# Patient Record
Sex: Male | Born: 1975 | Hispanic: Yes | Marital: Married | Smoking: Never smoker
Health system: Southern US, Community
[De-identification: ages and names within clinical notes are randomized; demographics above are authoritative.]

## PROBLEM LIST (undated history)

## (undated) DIAGNOSIS — F32A Depression, unspecified: Secondary | ICD-10-CM

## (undated) DIAGNOSIS — G510 Bell's palsy: Secondary | ICD-10-CM

## (undated) DIAGNOSIS — F419 Anxiety disorder, unspecified: Secondary | ICD-10-CM

## (undated) DIAGNOSIS — E119 Type 2 diabetes mellitus without complications: Secondary | ICD-10-CM

## (undated) DIAGNOSIS — I639 Cerebral infarction, unspecified: Secondary | ICD-10-CM

## (undated) HISTORY — DX: Cerebral infarction, unspecified: I63.9

## (undated) HISTORY — DX: Type 2 diabetes mellitus without complications: E11.9

## (undated) HISTORY — PX: APPENDECTOMY: SHX54

---

## 2017-05-07 ENCOUNTER — Other Ambulatory Visit: Payer: Self-pay

## 2017-05-07 ENCOUNTER — Emergency Department (HOSPITAL_BASED_OUTPATIENT_CLINIC_OR_DEPARTMENT_OTHER): Payer: Self-pay

## 2017-05-07 ENCOUNTER — Encounter (HOSPITAL_BASED_OUTPATIENT_CLINIC_OR_DEPARTMENT_OTHER): Payer: Self-pay | Admitting: Emergency Medicine

## 2017-05-07 ENCOUNTER — Observation Stay (HOSPITAL_BASED_OUTPATIENT_CLINIC_OR_DEPARTMENT_OTHER)
Admission: EM | Admit: 2017-05-07 | Discharge: 2017-05-09 | Disposition: A | Discharge status: Home / Self Care | Payer: Self-pay | Attending: Internal Medicine | Admitting: Internal Medicine

## 2017-05-07 DIAGNOSIS — Z79899 Other long term (current) drug therapy: Secondary | ICD-10-CM | POA: Insufficient documentation

## 2017-05-07 DIAGNOSIS — R51 Headache: Secondary | ICD-10-CM | POA: Insufficient documentation

## 2017-05-07 DIAGNOSIS — E119 Type 2 diabetes mellitus without complications: Secondary | ICD-10-CM | POA: Insufficient documentation

## 2017-05-07 DIAGNOSIS — R531 Weakness: Secondary | ICD-10-CM | POA: Insufficient documentation

## 2017-05-07 DIAGNOSIS — G518 Other disorders of facial nerve: Principal | ICD-10-CM | POA: Insufficient documentation

## 2017-05-07 DIAGNOSIS — F17200 Nicotine dependence, unspecified, uncomplicated: Secondary | ICD-10-CM | POA: Insufficient documentation

## 2017-05-07 DIAGNOSIS — M6281 Muscle weakness (generalized): Secondary | ICD-10-CM | POA: Diagnosis present

## 2017-05-07 DIAGNOSIS — E785 Hyperlipidemia, unspecified: Secondary | ICD-10-CM | POA: Insufficient documentation

## 2017-05-07 DIAGNOSIS — Z789 Other specified health status: Secondary | ICD-10-CM

## 2017-05-07 DIAGNOSIS — R200 Anesthesia of skin: Secondary | ICD-10-CM | POA: Insufficient documentation

## 2017-05-07 DIAGNOSIS — H5589 Other irregular eye movements: Secondary | ICD-10-CM | POA: Insufficient documentation

## 2017-05-07 DIAGNOSIS — R519 Headache, unspecified: Secondary | ICD-10-CM | POA: Diagnosis present

## 2017-05-07 DIAGNOSIS — I10 Essential (primary) hypertension: Secondary | ICD-10-CM | POA: Insufficient documentation

## 2017-05-07 DIAGNOSIS — R2990 Unspecified symptoms and signs involving the nervous system: Secondary | ICD-10-CM

## 2017-05-07 DIAGNOSIS — R4789 Other speech disturbances: Secondary | ICD-10-CM | POA: Insufficient documentation

## 2017-05-07 LAB — RAPID URINE DRUG SCREEN, HOSP PERFORMED
Amphetamines: NOT DETECTED
Barbiturates: NOT DETECTED
Benzodiazepines: NOT DETECTED
Cocaine: NOT DETECTED
Opiates: NOT DETECTED
Tetrahydrocannabinol: NOT DETECTED

## 2017-05-07 LAB — CBC WITH DIFFERENTIAL/PLATELET
Basophils Absolute: 0 10*3/uL (ref 0.0–0.1)
Basophils Relative: 0 %
Eosinophils Absolute: 0.1 10*3/uL (ref 0.0–0.7)
Eosinophils Relative: 2 %
HCT: 43.1 % (ref 39.0–52.0)
Hemoglobin: 15.4 g/dL (ref 13.0–17.0)
Lymphocytes Relative: 39 %
Lymphs Abs: 2.7 10*3/uL (ref 0.7–4.0)
MCH: 31.8 pg (ref 26.0–34.0)
MCHC: 35.7 g/dL (ref 30.0–36.0)
MCV: 88.9 fL (ref 78.0–100.0)
Monocytes Absolute: 0.4 10*3/uL (ref 0.1–1.0)
Monocytes Relative: 5 %
Neutro Abs: 3.7 10*3/uL (ref 1.7–7.7)
Neutrophils Relative %: 54 %
Platelets: 271 10*3/uL (ref 150–400)
RBC: 4.85 MIL/uL (ref 4.22–5.81)
RDW: 13 % (ref 11.5–15.5)
WBC: 6.8 10*3/uL (ref 4.0–10.5)

## 2017-05-07 LAB — BASIC METABOLIC PANEL
Anion gap: 10 (ref 5–15)
BUN: 15 mg/dL (ref 6–20)
CO2: 24 mmol/L (ref 22–32)
Calcium: 9.3 mg/dL (ref 8.9–10.3)
Chloride: 102 mmol/L (ref 101–111)
Creatinine, Ser: 0.67 mg/dL (ref 0.61–1.24)
GFR calc Af Amer: >60 mL/min (ref >60)
GFR calc non Af Amer: >60 mL/min (ref >60)
Glucose, Bld: 216 mg/dL — ABNORMAL HIGH (ref 65–99)
Potassium: 3.5 mmol/L (ref 3.5–5.1)
Sodium: 136 mmol/L (ref 135–145)

## 2017-05-07 LAB — PROTIME-INR
INR: 0.95
Prothrombin Time: 12.6 seconds (ref 11.4–15.2)

## 2017-05-07 LAB — APTT: aPTT: 27 seconds (ref 24–36)

## 2017-05-07 LAB — ETHANOL: Alcohol, Ethyl (B): <10 mg/dL (ref <10)

## 2017-05-07 MED ORDER — ASPIRIN 81 MG PO CHEW
324.0000 mg | CHEWABLE_TABLET | Freq: Once | ORAL | Status: AC
  Administered 2017-05-07: 324 mg via ORAL
  Filled 2017-05-07: qty 4

## 2017-05-07 NOTE — Plan of Care (Signed)
41 y/o male with no known medical history who presents with new right sided weakness. Last known normal pe family was 10 pm last night. Woke up this am with right sided weakness, perioral numbness and decrease in right eye movement. He is stil alert and oriented x 2. Initial CT head at [facility] was negative for stroke. Transfer to [facility] for further evaluation/workup.  [place] hospitalists

## 2017-05-07 NOTE — ED Notes (Signed)
Pt states some fullness feeling to face,  But pt and family in eating fast foods,   Pt told he should be npo at present

## 2017-05-07 NOTE — ED Triage Notes (Signed)
Pt sts he woke with RT side facial, arm and leg weakness; went to [facility] and they referred him here

## 2017-05-07 NOTE — ED Provider Notes (Signed)
[facility] Provider Note   CSN: [id number] Arrival date & time: 05/07/17  1049     History   Chief Complaint Chief Complaint  Patient presents with  . Facial Droop    HPI Drew [name] is a 41 y.o. male.  HPI   41 year old male with facial numbness and right-sided weakness.  First noticed symptoms around 8 AM this morning.  Last known normal around 10 PM yesterday when he went to bed.  When he woke up he noticed perioral numbness.  He felt like he was having difficulty controlling his right eye and having numbness and weakness in right arm and leg.  Mild posterior headache. Symptoms have been fairly constant since he first noticed them.  Denies any changes in visual acuity or diplopia. Patient is primarily Spanish-speaking.  Interpreter was used for history taking.  Family member reports that his speech seems perhaps a little "slow" but easily understandable. No confusion.   History reviewed. No pertinent past medical history.  There are no active problems to display for this patient.   Past Surgical History:  Procedure Laterality Date  . APPENDECTOMY         Home Medications    Prior to Admission medications   Not on File    Family History No family history on file.  Social History Social History   Tobacco Use  . Smoking status: Current Some Day Smoker  . Smokeless tobacco: Never Used  Substance Use Topics  . Alcohol use: Yes    Comment: occ  . Drug use: No     Allergies   Patient has no known allergies.   Review of Systems Review of Systems  All systems reviewed and negative, other than as noted in HPI.  Physical Exam Updated Vital Signs BP (!) 139/102   Pulse 80   Temp 98.7 F (37.1 C) (Oral)   Resp 18   Ht 5\' 9"  (1.753 m)   Wt 90.7 kg (200 lb)   SpO2 99%   BMI 29.53 kg/m   Physical Exam  Constitutional: He is oriented to person, place, and time. He appears well-developed and well-nourished. No distress.   HENT:  Head: Normocephalic and atraumatic.  Eyes: Conjunctivae are normal. Right eye exhibits no discharge. Left eye exhibits no discharge.  Neck: Neck supple.  Cardiovascular: Normal rate, regular rhythm and normal heart sounds. Exam reveals no gallop and no friction rub.  No murmur heard. Pulmonary/Chest: Effort normal and breath sounds normal. No respiratory distress.  Abdominal: Soft. He exhibits no distension. There is no tenderness.  Musculoskeletal: He exhibits no edema or tenderness.  Neurological: He is alert and oriented to person, place, and time.  Noticeably less wrinkling of L forehead. Nasolabial folds seem symmetric. Smile symmetric. Tongue midline. PERRL. The best I can tell, EOMI. Pt forcibly closing R eye when trying to assess. He says he cannot keep it open. Strength 4/5 R U/L. 5/5 L U/L ext. Normal finger-to-nose b/l.   Skin: Skin is warm and dry.  Psychiatric: He has a normal mood and affect. His behavior is normal. Thought content normal.  Nursing note and vitals reviewed.    ED Treatments / Results  Labs (all labs ordered are listed, but only abnormal results are displayed) Labs Reviewed  BASIC METABOLIC PANEL - Abnormal; Notable for the following components:      Result Value   Glucose, Bld 216 (*)    All other components within normal limits  CBC WITH DIFFERENTIAL/PLATELET  RAPID URINE DRUG SCREEN, HOSP PERFORMED  ETHANOL  PROTIME-INR  APTT  CBC WITH DIFFERENTIAL/PLATELET    EKG  EKG Interpretation  Date/Time:  Wednesday May 07 2017 11:14:22 EST Ventricular Rate:  83 PR Interval:    QRS Duration: 94 QT Interval:  369 QTC Calculation: 434 R Axis:   -131 Text Interpretation:  Sinus rhythm Non-specific ST-t changes No old tracing to compare Confirmed by Virgel Manifold [phone]) on 05/07/2017 11:28:59 AM       Radiology Ct Head Wo Contrast  Result Date: 05/07/2017 CLINICAL DATA:  Right facial, arm and leg weakness. EXAM: CT HEAD WITHOUT  CONTRAST TECHNIQUE: Contiguous axial images were obtained from the base of the skull through the vertex without intravenous contrast. COMPARISON:  None. FINDINGS: Brain: No acute intracranial abnormality. Specifically, no hemorrhage, hydrocephalus, mass lesion, acute infarction, or significant intracranial injury. Vascular: No hyperdense vessel or unexpected calcification. Skull: No acute calvarial abnormality. Sinuses/Orbits: Visualized paranasal sinuses and mastoids clear. Orbital soft tissues unremarkable. Other: None IMPRESSION: Normal study. Electronically Signed   By: Rolm Baptise M.D.   On: 05/07/2017 11:43    Procedures Procedures (including critical care time)  Medications Ordered in ED Medications  aspirin chewable tablet 324 mg (324 mg Oral Given 05/07/17 1214)     Initial Impression / Assessment and Plan / ED Course  I have reviewed the triage vital signs and the nursing notes.  Pertinent labs & imaging results that were available during my care of the patient were reviewed by me and considered in my medical decision making (see chart for details).     41yM with stroke-like symptoms. Presented to ED >12 hours after last known normal. CT head w/o acute abnormality. EKG sinus rhythm. Symptoms stable since notices. Aspirin. Transfer to [facility] for further eval.   Final Clinical Impressions(s) / ED Diagnoses   Final diagnoses:  Stroke-like symptoms    ED Discharge Orders    None       Virgel Manifold, MD 05/07/17 1404

## 2017-05-07 NOTE — ED Notes (Signed)
Patient transported to CT 

## 2017-05-07 NOTE — ED Notes (Signed)
Attempted to call report to 3W; nurse unavailable; this RN contact info provided.

## 2017-05-08 ENCOUNTER — Observation Stay (HOSPITAL_COMMUNITY): Payer: Self-pay

## 2017-05-08 DIAGNOSIS — R2990 Unspecified symptoms and signs involving the nervous system: Secondary | ICD-10-CM | POA: Diagnosis present

## 2017-05-08 DIAGNOSIS — R200 Anesthesia of skin: Secondary | ICD-10-CM | POA: Diagnosis present

## 2017-05-08 DIAGNOSIS — G44219 Episodic tension-type headache, not intractable: Secondary | ICD-10-CM

## 2017-05-08 DIAGNOSIS — R51 Headache: Secondary | ICD-10-CM

## 2017-05-08 DIAGNOSIS — R531 Weakness: Secondary | ICD-10-CM

## 2017-05-08 DIAGNOSIS — Z789 Other specified health status: Secondary | ICD-10-CM | POA: Diagnosis present

## 2017-05-08 DIAGNOSIS — R519 Headache, unspecified: Secondary | ICD-10-CM | POA: Diagnosis present

## 2017-05-08 DIAGNOSIS — M6281 Muscle weakness (generalized): Secondary | ICD-10-CM

## 2017-05-08 DIAGNOSIS — I639 Cerebral infarction, unspecified: Secondary | ICD-10-CM

## 2017-05-08 LAB — LIPID PANEL
CHOLESTEROL: 258 mg/dL — AB (ref 0–200)
HDL: 39 mg/dL — ABNORMAL LOW (ref >40)
LDL CALC: 143 mg/dL — AB (ref 0–99)
TRIGLYCERIDES: 379 mg/dL — AB (ref <150)
Total CHOL/HDL Ratio: 6.6 RATIO
VLDL: 76 mg/dL — ABNORMAL HIGH (ref 0–40)

## 2017-05-08 LAB — HEMOGLOBIN A1C
HEMOGLOBIN A1C: 8 % — AB (ref 4.8–5.6)
Mean Plasma Glucose: 182.9 mg/dL

## 2017-05-08 LAB — ECHOCARDIOGRAM COMPLETE
HEIGHTINCHES: 69 in
Weight: 3142.88 oz

## 2017-05-08 MED ORDER — METHYLPREDNISOLONE 4 MG PO TBPK
8.0000 mg | ORAL_TABLET | Freq: Every morning | ORAL | Status: AC
  Administered 2017-05-08: 8 mg via ORAL
  Filled 2017-05-08: qty 21

## 2017-05-08 MED ORDER — ACETAMINOPHEN 650 MG RE SUPP: 650.0000 mg | RECTAL | Status: DC | PRN

## 2017-05-08 MED ORDER — METHYLPREDNISOLONE 4 MG PO TBPK
8.0000 mg | ORAL_TABLET | Freq: Every evening | ORAL | Status: AC
  Administered 2017-05-08: 8 mg via ORAL

## 2017-05-08 MED ORDER — SODIUM CHLORIDE 0.9 % IV SOLN
INTRAVENOUS | Status: DC
  Administered 2017-05-08: 02:00:00 via INTRAVENOUS

## 2017-05-08 MED ORDER — IOPAMIDOL (ISOVUE-370) INJECTION 76%
INTRAVENOUS | Status: AC
  Administered 2017-05-08: 50 mL
  Filled 2017-05-08: qty 50

## 2017-05-08 MED ORDER — SENNOSIDES-DOCUSATE SODIUM 8.6-50 MG PO TABS: 1.0000 | ORAL_TABLET | Freq: Every evening | ORAL | Status: DC | PRN

## 2017-05-08 MED ORDER — ARTIFICIAL TEARS OPHTHALMIC OINT
TOPICAL_OINTMENT | Freq: Every day | OPHTHALMIC | Status: DC
  Administered 2017-05-08: 1 via OPHTHALMIC

## 2017-05-08 MED ORDER — METHYLPREDNISOLONE 4 MG PO TBPK
4.0000 mg | ORAL_TABLET | ORAL | Status: AC
  Administered 2017-05-08: 4 mg via ORAL

## 2017-05-08 MED ORDER — TOPIRAMATE 25 MG PO TABS
50.0000 mg | ORAL_TABLET | ORAL | Status: AC
  Administered 2017-05-08: 50 mg via ORAL
  Filled 2017-05-08: qty 2

## 2017-05-08 MED ORDER — METHYLPREDNISOLONE 4 MG PO TBPK: 4.0000 mg | ORAL_TABLET | Freq: Four times a day (QID) | ORAL | Status: DC

## 2017-05-08 MED ORDER — ATORVASTATIN CALCIUM 80 MG PO TABS
80.0000 mg | ORAL_TABLET | Freq: Every day | ORAL | Status: DC
  Administered 2017-05-08 – 2017-05-09 (×2): 80 mg via ORAL
  Filled 2017-05-08 (×2): qty 1

## 2017-05-08 MED ORDER — ACYCLOVIR 200 MG PO CAPS
800.0000 mg | ORAL_CAPSULE | Freq: Three times a day (TID) | ORAL | Status: DC
  Administered 2017-05-08 – 2017-05-09 (×4): 800 mg via ORAL
  Filled 2017-05-08 (×5): qty 4

## 2017-05-08 MED ORDER — METHYLPREDNISOLONE 4 MG PO TBPK: 8.0000 mg | ORAL_TABLET | Freq: Every evening | ORAL | Status: DC

## 2017-05-08 MED ORDER — STROKE: EARLY STAGES OF RECOVERY BOOK: Freq: Once | Status: DC

## 2017-05-08 MED ORDER — ASPIRIN EC 81 MG PO TBEC
81.0000 mg | DELAYED_RELEASE_TABLET | Freq: Every day | ORAL | Status: DC
  Administered 2017-05-08 – 2017-05-09 (×2): 81 mg via ORAL
  Filled 2017-05-08 (×2): qty 1

## 2017-05-08 MED ORDER — ACETAMINOPHEN 160 MG/5ML PO SOLN: 650.0000 mg | ORAL | Status: DC | PRN

## 2017-05-08 MED ORDER — METHYLPREDNISOLONE 4 MG PO TBPK
4.0000 mg | ORAL_TABLET | Freq: Three times a day (TID) | ORAL | Status: AC
  Administered 2017-05-09 (×3): 4 mg via ORAL

## 2017-05-08 MED ORDER — ENOXAPARIN SODIUM 40 MG/0.4ML ~~LOC~~ SOLN
40.0000 mg | Freq: Every day | SUBCUTANEOUS | Status: DC
  Administered 2017-05-08 – 2017-05-09 (×2): 40 mg via SUBCUTANEOUS
  Filled 2017-05-08 (×2): qty 0.4

## 2017-05-08 MED ORDER — ACETAMINOPHEN 325 MG PO TABS: 650.0000 mg | ORAL_TABLET | ORAL | Status: DC | PRN

## 2017-05-08 MED ORDER — TOPIRAMATE 25 MG PO TABS
50.0000 mg | ORAL_TABLET | Freq: Every day | ORAL | Status: DC
  Administered 2017-05-09: 50 mg via ORAL
  Filled 2017-05-08: qty 2

## 2017-05-08 MED ORDER — ARTIFICIAL TEARS OPHTHALMIC OINT
TOPICAL_OINTMENT | Freq: Every day | OPHTHALMIC | Status: DC
  Administered 2017-05-08: 03:00:00 via OPHTHALMIC
  Filled 2017-05-08: qty 3.5

## 2017-05-08 NOTE — Progress Notes (Signed)
Pt. to MRI via bed by transport team.

## 2017-05-08 NOTE — Progress Notes (Signed)
Eyepatch ordered. Awaiting for item from material management.    Hav, RN

## 2017-05-08 NOTE — Progress Notes (Addendum)
Pt. transported from [place] to [facility] via [facility]- to [id number]; report given to RN by [facility]. Pt. Spanish speaking alert and oriented x4; (L) facial droop noted, and (R) eye painful and hard for pt. to open; and pt. States that back of head is hurting; states he didn't fall at home. Hospital translator service and family used/assisted with translation. Pt./family oriented to room and call button. Paged [facility] re: new admit.

## 2017-05-08 NOTE — Plan of Care (Signed)
  Education: Knowledge of General Education information will improve 05/08/2017 0244 - Progressing by Anson Fret, RN Note Pt. Spanish speaking- used interpretor device and family member to assist with giving info; POC reviewed with pt./family; wife at bedside; informed pt./wife of MRI brain ordered.

## 2017-05-08 NOTE — Progress Notes (Signed)
Echocardiogram 2D Echocardiogram has been performed.  Drew [name] 05/08/2017, 12:14 PM

## 2017-05-08 NOTE — Evaluation (Signed)
Occupational Therapy Evaluation Patient Details Name: Drew [name] MRN: [id number] DOB: Apr 13, 1976 Today's Date: 05/08/2017    History of Present Illness 41 year old male with facial numness and is having difficulty controlling his right eye and having numbness and weakness in right arm and leg.  Mild posterior headache reported. Neuro work up underway.   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 Currently pt overall min guard assist for functional mobility and ADL. Pt presenting with mild balance deficits, RUE decreased sensation/coordination/strength, R eye droop/fatigue but no report of visual changes. Pt planning to d/c home with supervision from family. Recommending Neuro Outpatient OT for follow up. Pt would benefit from continued skilled OT to address established goals.    Follow Up Recommendations  Outpatient OT(Neuro Outpatient OT)    Equipment Recommendations  None recommended by OT    Recommendations for Other Services       Precautions / Restrictions Precautions Precautions: None Restrictions Weight Bearing Restrictions: No      Mobility Bed Mobility               General bed mobility comments: Pt OOB in chair upon arrival  Transfers Overall transfer level: Needs assistance Equipment used: None Transfers: Sit to/from Stand Sit to Stand: Supervision         General transfer comment: Supervision initially    Balance Overall balance assessment: Needs assistance Sitting-balance support: Feet supported;No upper extremity supported Sitting balance-Leahy Scale: Good     Standing balance support: No upper extremity supported;During functional activity Standing balance-Leahy Scale: Good               High level balance activites: Direction changes;Turns;Head turns High Level Balance Comments: close supervision, ocassional balance check and posterior/right LOB able to self correct           ADL either  performed or assessed with clinical judgement   ADL Overall ADL's : Needs assistance/impaired Eating/Feeding: Set up;Sitting   Grooming: Minimal assistance;Sitting   Upper Body Bathing: Set up;Supervision/ safety;Sitting   Lower Body Bathing: Min guard;Sit to/from stand   Upper Body Dressing : Set up;Sitting;Supervision/safety   Lower Body Dressing: Min guard;Sit to/from stand   Toilet Transfer: Min guard;Ambulation;Regular Toilet       Tub/ Banker: Min guard;Ambulation   Functional mobility during ADLs: Min guard       Vision Baseline Vision/History: No visual deficits Patient Visual Report: Eye fatigue/eye pain/headache Additional Comments: Disconjugate gaze noted at times, pt reports R eye fatigue and droop. Difficulty keeping eye open. Pt reports he feels like he is unable to keep eye open even more when he is up walking. Difficult to assess vision secondary to pt unable to keep R eye open.     Perception     Praxis      Pertinent Vitals/Pain Pain Assessment: Faces Faces Pain Scale: Hurts a little bit Pain Location: eye and face Pain Descriptors / Indicators: ("feels weird") Pain Intervention(s): Monitored during session     Hand Dominance Right   Extremity/Trunk Assessment Upper Extremity Assessment Upper Extremity Assessment: RUE deficits/detail RUE Deficits / Details: grossly 4/5, full AROM, decreased sensation, decreased FM coordination RUE Sensation: decreased light touch RUE Coordination: decreased fine motor   Lower Extremity Assessment Lower Extremity Assessment: Defer to PT evaluation    Cervical / Trunk Assessment Cervical / Trunk Assessment: Normal   Communication Communication Communication: Prefers language other than [place];Interpreter utilized(Video interpreter: Felicita Gage [phone])   Cognition Arousal/Alertness: Awake/alert Behavior  During Therapy: WFL for tasks assessed/performed Overall Cognitive Status: Within Functional  Limits for tasks assessed                                     General Comments       Exercises     Shoulder Instructions      Home Living Family/patient expects to be discharged to:: Private residence Living Arrangements: Spouse/significant other;Children Available Help at Discharge: Family Type of Home: House Home Access: Stairs to enter Technical brewer of Steps: 8-10 Entrance Stairs-Rails: Right Home Layout: Two level;Able to live on main level with bedroom/bathroom     Bathroom Shower/Tub: Occupational psychologist: Standard     Home Equipment: Shower seat          Prior Functioning/Environment Level of Independence: Independent        Comments: working in Architect [employer]        OT Problem List: Decreased strength;Impaired balance (sitting and/or standing);Impaired vision/perception;Decreased coordination;Impaired sensation;Impaired UE functional use      OT Treatment/Interventions: Self-care/ADL training;Neuromuscular education;Therapeutic exercise;Therapeutic activities;Patient/family education;Visual/perceptual remediation/compensation;Balance training    OT Goals(Current goals can be found in the care plan section) Acute Rehab OT Goals Patient Stated Goal: to go home OT Goal Formulation: With patient Time For Goal Achievement: 05/22/17 Potential to Achieve Goals: Good ADL Goals Pt Will Perform Eating: with modified independence;sitting Pt Will Perform Grooming: with modified independence;standing Pt/caregiver will Perform Home Exercise Program: Increased strength;Right Upper extremity;With theraputty;Independently(Increased fine motor coordination)  OT Frequency: Min 2X/week   Barriers to D/C:            Co-evaluation PT/OT/SLP Co-Evaluation/Treatment: Yes Reason for Co-Treatment: To address functional/ADL transfers   OT goals addressed during session: ADL's and self-care      AM-PAC PT "6 Clicks" Daily  Activity     Outcome Measure Help from another person eating meals?: A Little Help from another person taking care of personal grooming?: A Little Help from another person toileting, which includes using toliet, bedpan, or urinal?: A Little Help from another person bathing (including washing, rinsing, drying)?: A Little Help from another person to put on and taking off regular upper body clothing?: A Little Help from another person to put on and taking off regular lower body clothing?: A Little 6 Click Score: 18   End of Session    Activity Tolerance: Patient tolerated treatment well Patient left: in chair;with call bell/phone within reach;with family/visitor present  OT Visit Diagnosis: Unsteadiness on feet (R26.81);Muscle weakness (generalized) (M62.81)                Time: 7741-2878 OT Time Calculation (min): 20 min Charges:  OT General Charges $OT Visit: 1 Visit OT Evaluation $OT Eval Low Complexity: 1 Low G-Codes: OT G-codes **NOT FOR INPATIENT CLASS** Functional Assessment Tool Used: Clinical judgement Functional Limitation: Self care Self Care Current Status (M7672): At least 1 percent but less than 20 percent impaired, limited or restricted Self Care Goal Status (C9470): 0 percent impaired, limited or restricted   Mel Almond A. Ulice Brilliant, M.S., OTR/L Pager: [place] 05/08/2017, 11:28 AM

## 2017-05-08 NOTE — Progress Notes (Signed)
Patient seen and examined at bedside, patient admitted after midnight, please see earlier detailed admission note by Roney Jaffe, MD. Briefly, patient presented with left sided facial numbness and weakness and associated right sided arm/leg weakness. MRI negative for stroke. Patient with Bells Palsy. Concern for possible brainstem stroke per neurology. MRI pending. Started on steroids, antiviral for Bells Palsy. No evidence of rash. No tenderness about the jaw.   Cordelia Poche, MD [facility] 05/08/2017, 4:01 PM Pager: [phone]

## 2017-05-08 NOTE — Evaluation (Signed)
Physical Therapy Evaluation Patient Details Name: Drew [name] MRN: [id number] DOB: 1975-10-15 Today's Date: 05/08/2017   History of Present Illness  41 year old male with facial numness and is having difficulty controlling his right eye and having numbness and weakness in right arm and leg.  Mild posterior headache reported. Neuro work up underway.  Clinical Impression  Orders received for PT evaluation. Patient demonstrates deficits in functional mobility as indicated below. Will benefit from continued skilled PT to address deficits and maximize function. Will see as indicated and progress as tolerated.  OF NOTE: patient continues to present with right sided sensory deficits. Recommending outpatient follow up to ensure safe return to PLOF and work.    Follow Up Recommendations Outpatient PT    Equipment Recommendations  None recommended by PT    Recommendations for Other Services       Precautions / Restrictions Precautions Precautions: None Restrictions Weight Bearing Restrictions: No      Mobility  Bed Mobility               General bed mobility comments: received in chair  Transfers Overall transfer level: Independent Equipment used: None                Ambulation/Gait Ambulation/Gait assistance: Supervision Ambulation Distance (Feet): 310 Feet Assistive device: None Gait Pattern/deviations: WFL(Within Functional Limits)     General Gait Details: ocassional LOB but able to self correct without physical assist. PT reports RLE fatigue and 'jumping' sensation in right eye with mobility  Stairs Stairs: Yes Stairs assistance: Supervision Stair Management: Two rails Number of Stairs: 5 General stair comments: One incident of decreased RLE clearance  Wheelchair Mobility    Modified Rankin (Stroke Patients Only) Modified Rankin (Stroke Patients Only) Pre-Morbid Rankin Score: No symptoms Modified Rankin: Moderate disability     Balance      Sitting balance-Leahy Scale: Good       Standing balance-Leahy Scale: Good               High level balance activites: Direction changes;Turns;Head turns High Level Balance Comments: close supervision, ocassional balance check and posterior/right LOB able to self correct             Pertinent Vitals/Pain Pain Assessment: Faces Faces Pain Scale: Hurts a little bit Pain Location: eye and face Pain Descriptors / Indicators: ("feels weird") Pain Intervention(s): Monitored during session    [place] expects to be discharged to:: Private residence Living Arrangements: Spouse/significant other;Children Available Help at Discharge: Family Type of Home: House Home Access: Stairs to enter Entrance Stairs-Rails: Right Entrance Stairs-Number of Steps: 8-10 Home Layout: Two level;Able to live on main level with bedroom/bathroom Home Equipment: Shower seat      Prior Function Level of Independence: Independent               Hand Dominance        Extremity/Trunk Assessment   Upper Extremity Assessment Upper Extremity Assessment: RUE deficits/detail RUE Deficits / Details: strength intact sensation diminished RUE Sensation: decreased light touch    Lower Extremity Assessment Lower Extremity Assessment: RLE deficits/detail RLE Deficits / Details: strength in tact sensation diminished RLE Sensation: decreased light touch RLE Coordination: decreased fine motor       Communication   Communication: Prefers language other than [place];Interpreter utilized(Video interpreter: Felicita Gage [phone])  Cognition Arousal/Alertness: Awake/alert Behavior During Therapy: WFL for tasks assessed/performed Overall Cognitive Status: Within Functional Limits for tasks assessed  General Comments      Exercises     Assessment/Plan    PT Assessment Patient needs continued PT services  PT Problem List  Decreased activity tolerance;Decreased balance;Decreased coordination;Impaired sensation       PT Treatment Interventions DME instruction;Gait training;Stair training;Functional mobility training;Therapeutic activities;Therapeutic exercise;Balance training;Neuromuscular re-education    PT Goals (Current goals can be found in the Care Plan section)  Acute Rehab PT Goals Patient Stated Goal: to go home PT Goal Formulation: With patient Time For Goal Achievement: 05/22/17 Potential to Achieve Goals: Good    Frequency Min 3X/week   Barriers to discharge        Co-evaluation               AM-PAC PT "6 Clicks" Daily Activity  Outcome Measure Difficulty turning over in bed (including adjusting bedclothes, sheets and blankets)?: A Little Difficulty moving from lying on back to sitting on the side of the bed? : None Difficulty sitting down on and standing up from a chair with arms (e.g., wheelchair, bedside commode, etc,.)?: None Help needed moving to and from a bed to chair (including a wheelchair)?: None Help needed walking in hospital room?: A Little Help needed climbing 3-5 steps with a railing? : A Little 6 Click Score: 21    End of Session   Activity Tolerance: Patient tolerated treatment well Patient left: in chair;with call bell/phone within reach;with family/visitor present Nurse Communication: Mobility status PT Visit Diagnosis: Unsteadiness on feet (R26.81);Other symptoms and signs involving the nervous system (R29.898)    Time: 5427-0623 PT Time Calculation (min) (ACUTE ONLY): 21 min   Charges:   PT Evaluation $PT Eval Moderate Complexity: 1 Mod     PT G Codes:   PT G-Codes **NOT FOR INPATIENT CLASS** Functional Assessment Tool Used: Clinical judgement Functional Limitation: Mobility: Walking and moving around Mobility: Walking and Moving Around Current Status (J6283): At least 1 percent but less than 20 percent impaired, limited or restricted Mobility:  Walking and Moving Around Goal Status [phone]): At least 1 percent but less than 20 percent impaired, limited or restricted    Alben Deeds, PT DPT  Board Certified Neurologic Specialist [place] 05/08/2017, 8:42 AM

## 2017-05-08 NOTE — Progress Notes (Signed)
NEUROHOSPITALISTS STROKE TEAM - DAILY PROGRESS NOTE   ADMISSION HISTORY: Drew [name] is a 41 y.o. male who awoke with right-sided weakness is morning.  It worsened over the course of a few hours, and then has been gradually improving since the time.  Currently is essentially back to normal, however he also has had facial weakness and numbness.  This is persistent.  This is left-sided.  He has been having occipital headaches for quite some time, but they have been worse over the past few days. LKW: 12/25 prior to bed tpa given?: no, outside of window  SUBJECTIVE (INTERVAL HISTORY)  Wife is at the bedside. Patient is found laying in bed in NAD. Overall he feels his condition is unchanged. Voices no new complaints. No new events reported overnight.  OBJECTIVE Lab Results: CBC:  Recent Labs  Lab 05/07/17 1113  WBC 6.8  HGB 15.4  HCT 43.1  MCV 88.9  PLT 271   BMP: Recent Labs  Lab 05/07/17 1113  NA 136  K 3.5  CL 102  CO2 24  GLUCOSE 216*  BUN 15  CREATININE 0.67  CALCIUM 9.3   Coagulation Studies:  Recent Labs    05/07/17 1113  APTT 27  INR 0.95   Urine Drug Screen:     Component Value Date/Time   LABOPIA NONE DETECTED 05/07/2017 1257   COCAINSCRNUR NONE DETECTED 05/07/2017 1257   LABBENZ NONE DETECTED 05/07/2017 1257   AMPHETMU NONE DETECTED 05/07/2017 1257   THCU NONE DETECTED 05/07/2017 1257   LABBARB NONE DETECTED 05/07/2017 1257    Alcohol Level:  Recent Labs  Lab 05/07/17 1113  ETH <10   PHYSICAL EXAM Temp:  [98.2 F (36.8 C)-98.7 F (37.1 C)] 98.3 F (36.8 C) (12/27 1313) Pulse Rate:  [59-75] 74 (12/27 1313) Resp:  [13-20] 20 (12/27 1313) BP: (127-157)/(81-113) 143/90 (12/27 1313) SpO2:  [95 %-100 %] 97 % (12/27 1313) Weight:  [89.1 kg (196 lb 6.9 oz)] 89.1 kg (196 lb 6.9 oz) (12/26 2228) General - Well nourished, well developed, in no apparent distress Respiratory - Lungs clear bilaterally.  No wheezing. Cardiovascular - Regular rate and rhythm  Neuro: Mental Status: Patient is awake, alert, oriented to person, place, month, year, and situation. Patient is able to give a clear and coherent history. No signs of aphasia or neglect Cranial Nerves: II: Visual Fields are full. Pupils are equal, round, and reactive to light.   III,IV, VI: EOMI without diploplia. +ptosis on left V: Facial sensation is  decreased on the left VII: Facial movement is decreased on the left up[per as well as lower face. Doll`s phenomenon is positive,   unable to close his left eye fully. VIII: hearing is intact to voice, decreased slightly on right X: Uvula elevates symmetrically XI: Shoulder shrug is symmetric. XII: tongue is midline without atrophy or fasciculations.  Motor: Tone is normal. Bulk is normal. 5/5 strength was present in all four extremities.  Sensory: Sensation and vibration is decreased on right. Cerebellar: FNF and HKS are slower on the right than on the left,  IMAGING: I have personally reviewed the radiological images below and agree with the radiology interpretations. Ct Angio Head and Neck W Or Wo Contrast Result Date: 05/08/2017 IMPRESSION: Negative CTA of head and neck.   Ct Head Wo Contrast Result Date: 05/07/2017 IMPRESSION: Normal study.   Mr Brain Wo Contrast Result Date: 05/08/2017 IMPRESSION: Normal MRI of the brain.   Echocardiogram:  PENDING MRI Face Trigeminal:                                        PENDING     ASSESSMENT: [name] is a 41 y.o. male with acute onset right sided weakness, left sided facial numbness and ptosis and intermittent occipital headache.  MRI negative for stroke.  STROKE Ruled out:  Stroke Risk Factors: diabetes mellitus, hyperlipidemia and hypertension Other Stroke Risk Factors: Cigarette smoker  Outstanding Stroke Work-up Studies:     Echocardiogram: not done                PENDING MRI Face Trigeminal:                        PENDING  05/08/2017: Neuro exam unchanged.  Interview and examination done through [place] interpreter at bedside.  MRI negative for stroke.  Patient's symptoms most likely Bell's palsy with atypical migraine.  Will rule out Lyme's disease.  Aspirin and Lipitor added for stroke prevention.  MRI facial nerve protocol is  pending.  We will continue to monitor closely.  PLAN  05/08/2017: Continue Aspirin/ Statin Frequent neuro checks Telemetry monitoring PT/OT/SLP Consult Case Management /MSW Ongoing aggressive stroke risk factor management Patient counseled to be compliant with his antithrombotic medications Patient counseled on Lifestyle modifications including, Diet, Exercise, and Stress Follow up with [facility] in 6 weeks  Bell's palsy Prednisone Dosepak MRI face trigeminal with and without contrast  Rule out Lyme disease Prophylactic acyclovir Lyme disease titers and antibodies  Atypical migraine Patient describes nightly headache 3-4 times per week without aura Topamax daily  HYPERTENSION: Stable Long term BP goal normotensive. May start home B/P medications, if applicable Home Meds: NONE  Newly diagnosed HYPERLIPIDEMIA:    Component Value Date/Time   CHOL 258 (H) 05/08/2017 0903   TRIG 379 (H) 05/08/2017 0903   HDL 39 (L) 05/08/2017 0903   CHOLHDL 6.6 05/08/2017 0903   VLDL 76 (H) 05/08/2017 0903   LDLCALC 143 (H) 05/08/2017 0903  Home Meds:  NONE LDL  goal < 70 Started on Lipitor to 80 mg daily Continue statin at discharge  Newly diagnosed DIABETES: Lab Results  Component Value Date   HGBA1C 8.0 (H) 05/08/2017  HgbA1c goal < 7.0 Currently on: Will need NovoLog Continue CBG monitoring and SSI to maintain glucose 140-180 mg/dl DM education   TOBACCO ABUSE Current smoker Smoking cessation counseling provided Nicotine patch provided  Other Active Problems: Principal Problem:    Stroke-like symptoms Active Problems:   Weakness   Right-sided muscle weakness   Right sided numbness   Headache   Non-English speaking patient  Hospital day # 0 VTE prophylaxis: Lovenox  Diet : Diet regular Room service appropriate? Yes; Fluid consistency: Thin   FAMILY UPDATES: family at bedside  TEAM UPDATES: Mariel Aloe, MD     Prior Home Stroke Medications:  aspirin 325 mg daily  Discharge Stroke Meds:  Please discharge patient on TBD   Disposition: Final discharge disposition not confirmed Therapy Recs:               Outpatient PT OT Home Equipment:         None Follow Up:  Follow-up Information    Garvin Fila, MD. Schedule an appointment as soon as possible for a visit in 6 week(s).   Specialties:  Neurology, Radiology Contact information: [address] [place] [address] [place] Follow up on 05/21/2017.   Why:  Your appointment is at 1 pm. please arrive 15 min early and bring: picute ID and current medications. Contact information:  [address] [place], [place] [address]  [phone]       [facility] Follow up.   Why:  Please use this location for your medication needs.  Contact information: [place] [phone] [place] Follow up.   Specialty:  Rehabilitation Why:  they will contact you for the first appointment Contact information: [place] [phone]         Patient, No Pcp Per -PCP Follow up in 1-2 weeks   Case Management aware of need  Renie Ora Stroke Neurology Team 05/08/2017 5:08 PM I have personally examined this patient, reviewed notes, independently viewed imaging studies, participated in medical decision making and plan of care.ROS completed by me personally and  pertinent positives fully documented  I have made any additions or clarifications directly to the above note. Agree with note above.  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  Even though his clinical presentation suggest left pontine stroke MRI is negative.  He also has significant headaches which may suggest a typical migraine.  Start Topamax for headache and prednisone and acyclovir for Bell's palsy.  Check lab work for Lyme's disease recommend repeat MRI with facial nerve protocol as well as pain sections through the brainstem to image for small stroke versus facial nerve lesion.  Long discussion with the patient and wife at the bedside using Spanish language interpreter and answered questions.  Discussed with Dr. Lonny Prude.  Greater than 50% time during this 35-minute visit was spent on counseling and coordination of care about his facial nerve palsy, numbness, headache and answering questions.  Antony Contras, MD Medical Director [facility] Pager: [phone] 05/08/2017 6:00 PM  To contact Stroke Continuity provider, please refer to [email or web address] After hours, contact General Neurology

## 2017-05-08 NOTE — Care Management Note (Signed)
Case Management Note  Patient Details  Name: Drew [name] MRN: [id number] Date of Birth: [birth year]  Subjective/Objective:   Pt in with stroke like symptoms. He is from home with spouse.               Action/Plan: CM consulted d/t pt without insurance and PCP. CM met with patient and using interpretor inquired about one of the [facility] clinics. Patient interested. CM was able to obtain an appointment at [facility]. Information on the AVS. Pt also encouraged to use the [facility] pharmacy for his medications. Pt voiced understanding. CM will leave [place] letter in case pt d/c tonight.  Pt has transportation home.   Expected Discharge Date:                  Expected Discharge Plan:  OP Rehab  In-House Referral:     Discharge planning Services  CM Consult  Post Acute Care Choice:    Choice offered to:     DME Arranged:    DME Agency:     HH Arranged:    [place] Agency:     Status of Service:  Completed, signed off  If discussed at [employer] of Stay Meetings, dates discussed:    Additional Comments:  Pollie Friar, RN 05/08/2017, 4:04 PM

## 2017-05-08 NOTE — Consult Note (Signed)
Neurology Consultation Reason for Consult: Right-sided weakness Referring Physician: Jonnie Finner, R  CC: Right-sided weakness  History is obtained from: Patient  HPI: Drew [name] is a 41 y.o. male who awoke with right-sided weakness is morning.  It worsened over the course of a few hours, and then has been gradually improving since the time.  Currently is essentially back to normal, however he also has had facial weakness and numbness.  This is persistent.  This is left-sided.  He has been having occipital headaches for quite some time, but they have been worse over the past few days.  LKW: 12/25 prior to bed tpa given?: no, outside of window  ROS: A 14 point ROS was performed and is negative except as noted in the HPI.   History reviewed. No pertinent past medical history. Does not take any medications  Family history: No history of stroke  Social History:  reports that he has been smoking.  he has never used smokeless tobacco. He reports that he drinks alcohol. He reports that he does not use drugs.   Exam: Current vital signs: BP 129/88 (BP Location: Right Arm)   Pulse 70   Temp 98.2 F (36.8 C) (Oral)   Resp 18   Ht 5\' 9"  (1.753 m)   Wt 89.1 kg (196 lb 6.9 oz)   SpO2 96%   BMI 29.01 kg/m  Vital signs in last 24 hours: Temp:  [98.2 F (36.8 C)-98.7 F (37.1 C)] 98.2 F (36.8 C) (12/27 0138) Pulse Rate:  [59-104] 70 (12/26 2228) Resp:  [13-20] 18 (12/27 0138) BP: (121-164)/(87-113) 129/88 (12/27 0138) SpO2:  [95 %-100 %] 96 % (12/27 0138) Weight:  [89.1 kg (196 lb 6.9 oz)-90.7 kg (200 lb)] 89.1 kg (196 lb 6.9 oz) (12/26 2228)   Physical Exam  Constitutional: Appears well-developed and well-nourished.  Psych: Affect appropriate to situation Eyes: No scleral injection HENT: No OP obstrucion Head: Normocephalic.  Cardiovascular: Normal rate and regular rhythm.  Respiratory: Effort normal, non-labored breathing GI: Soft.  No distension. There is no tenderness.  Skin:  WDI  Neuro: Mental Status: Patient is awake, alert, oriented to person, place, month, year, and situation. Patient is able to give a clear and coherent history. No signs of aphasia or neglect Cranial Nerves: II: Visual Fields are full. Pupils are equal, round, and reactive to light.   III,IV, VI: EOMI without ptosis or diploplia.  V: Facial sensation is  decreased on the left VII: Facial movement is decreased on the left, he gives his eye on the right partially closed, unable to close his left eye fully. VIII: hearing is intact to voice X: Uvula elevates symmetrically XI: Shoulder shrug is symmetric. XII: tongue is midline without atrophy or fasciculations.  Motor: Tone is normal. Bulk is normal. 5/5 strength was present in all four extremities.  Sensory: Sensation is symmetric to light touch and temperature in the arms and legs. 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  With his occipital headache, I would favor better evaluation of the posterior circulation with CTA.  His glucose appears to be consistent with diabetes.  Recommendations: 1. HgbA1c, fasting lipid panel 2. MRI of the brain  without contrast 3. Frequent neuro checks 4. Echocardiogram 5.  CTA head and neck 6. Prophylactic therapy-Antiplatelet med: Aspirin - dose 325mg  PO or 300mg  PR 7. Risk factor modification 8. Telemetry monitoring 9. PT consult, OT consult, Speech consult 10.  Lacri-Lube due to difficulty closing his left eye 11. please page stroke NP  Or  PA  Or MD  from 8am -4 pm as this patient will be followed by the stroke team at this point.   You can look them up on [email or web address]     Roland Rack, MD [facility] [phone]  If 7pm- 7am, please page neurology on call as listed in [place].

## 2017-05-08 NOTE — Progress Notes (Signed)
Back from xray

## 2017-05-08 NOTE — H&P (Signed)
[facility] History and Physical  [place] Merida ZWC:[id number] DOB: 11/22/1975 DOA: 05/07/2017  Referring physician: Dr. Wilson Singer, ED MD [facility] PCP: Patient, No Pcp Per   Chief Complaint: R side weakness and R face numbness  HPI: Drew [name] is a 41 y.o. male with no sig medical history, presented to [facility] ED today around 11 am.   He doesn't speak [place].  He reported R side facial, leg and arm weakness at an UC center and they referred him to the ED.  Head CT in ED was normal.  ED provider dx was stroke-like symptoms , presenting > 12 hrs after LKN.  Gave ASA and transferred to [facility] for further evaluation.   Here the patient is interviewed w/ his son interpreting.  He is having hard time keeping R eye open.  R leg and arm weakness are better now.  Numbness is better too.  Patient has hx a occipital headache, "really bad".  He has hx of headaches in the past , but never this one-sided weakness.   Pt smokes about 7 cigarettes per day.  Rare etoh use.  Works in [employer].  Wife and children are here tonight.  No history of any stroke, MI or other medical condition.  No home medications. No prior hosp admits.     ROS  denies CP  no joint pain   no HA  no rash  no diarrhea  no nausea/ vomiting  no dysuria  no difficulty voiding  no change in urine color    Past Medical History History reviewed. No pertinent past medical history. Past Surgical History  Past Surgical History:  Procedure Laterality Date  . APPENDECTOMY     Family History No family history on file. Social History  reports that he has been smoking.  he has never used smokeless tobacco. He reports that he drinks alcohol. He reports that he does not use drugs. Allergies No Known Allergies Home medications Prior to Admission medications   Not on File   Liver Function Tests No results for input(s): AST, ALT, ALKPHOS, BILITOT, PROT, ALBUMIN in the last 168 hours. No results for input(s): LIPASE, AMYLASE in the last 168  hours. CBC Recent Labs  Lab 05/07/17 1113  WBC 6.8  NEUTROABS 3.7  HGB 15.4  HCT 43.1  MCV 88.9  PLT 235   Basic Metabolic Panel Recent Labs  Lab 05/07/17 1113  NA 136  K 3.5  CL 102  CO2 24  GLUCOSE 216*  BUN 15  CREATININE 0.67  CALCIUM 9.3     Vitals:   05/07/17 2030 05/07/17 2100 05/07/17 2130 05/07/17 2228  BP: 134/90 (!) 135/99 (!) 142/103 (!) 143/87  Pulse: 72 67 (!) 59 70  Resp: 18 17 18 18   Temp:    98.2 F (36.8 C)  TempSrc:    Axillary  SpO2: 99% 97% 99% 100%  Weight:    89.1 kg (196 lb 6.9 oz)  Height:    5\' 9"  (1.753 m)   Exam: Gen awake and alert, conversant, no distress R eye not opening fully No rash, cyanosis or gangrene Sclera anicteric, throat clear  No jvd or bruits Chest clear bilat RRR no MRG Abd soft ntnd no mass or ascites +bs GU normal male MS no joint effusions or deformity Ext no LE edema / no wounds or ulcers Neuro is alert, Ox 3 Upper ext 5/5 bilat LE's 5/5 bilat R eyelid won't open fully, unable to do VF"s on R VF's on left are full  Cerebellar testing wnl upper and lower ext    Home meds: none  Na136  K 3.5  CO2 24 BUN 15  Cr 0.67   Ca 9.3   WBC 6k   Hb 15   plt 271 UDS negative  EKG (independ reviewed) > Sinus rhythm Non-specific ST-t changes No old tracing to compare  CT head > Normal study.   Assessment: 1. R sided weakness/ numbness - associated with a headache.  Improved now w/ residual R eye/ eyelid weakness (?).  Unclear etiology, CVA/ TIA vs migraine related vs other.  Have asked neurology to see.  Will get MRI.  2. No other medical conditions     Plan - as above       [place] D [facility] Pager [phone]   If 7PM-7AM, please contact night-coverage [email or web address] Password [facility] 05/08/2017, 12:36 AM

## 2017-05-08 NOTE — Progress Notes (Signed)
Eyepatch given with instruction. Son at bedside.   [place]av, RN

## 2017-05-09 ENCOUNTER — Observation Stay (HOSPITAL_COMMUNITY): Payer: Self-pay

## 2017-05-09 DIAGNOSIS — G44229 Chronic tension-type headache, not intractable: Secondary | ICD-10-CM

## 2017-05-09 LAB — B. BURGDORFI ANTIBODIES: B burgdorferi Ab IgG+IgM: <0.91 {ISR} (ref 0.00–0.90)

## 2017-05-09 LAB — HIV ANTIBODY (ROUTINE TESTING W REFLEX): HIV SCREEN 4TH GENERATION: NONREACTIVE

## 2017-05-09 MED ORDER — ACYCLOVIR 200 MG PO CAPS: 800.0000 mg | ORAL_CAPSULE | Freq: Three times a day (TID) | ORAL | 0 refills | Status: DC

## 2017-05-09 MED ORDER — ARTIFICIAL TEARS OPHTHALMIC OINT: TOPICAL_OINTMENT | Freq: Every day | OPHTHALMIC | 0 refills | Status: DC

## 2017-05-09 MED ORDER — GADOBENATE DIMEGLUMINE 529 MG/ML IV SOLN
20.0000 mL | Freq: Once | INTRAVENOUS | Status: AC
  Administered 2017-05-09: 18 mL via INTRAVENOUS

## 2017-05-09 MED ORDER — ATORVASTATIN CALCIUM 80 MG PO TABS: 80.0000 mg | ORAL_TABLET | Freq: Every day | ORAL | 0 refills | Status: DC

## 2017-05-09 MED ORDER — METHYLPREDNISOLONE 4 MG PO TBPK: ORAL_TABLET | ORAL | 0 refills | Status: DC

## 2017-05-09 MED ORDER — ASPIRIN 81 MG PO TBEC: 81.0000 mg | DELAYED_RELEASE_TABLET | Freq: Every day | ORAL | 0 refills | Status: DC

## 2017-05-09 MED ORDER — TOPIRAMATE 50 MG PO TABS: 50.0000 mg | ORAL_TABLET | Freq: Every day | ORAL | 0 refills | Status: DC

## 2017-05-09 MED ORDER — METFORMIN HCL 500 MG PO TABS: 500.0000 mg | ORAL_TABLET | Freq: Two times a day (BID) | ORAL | 0 refills | Status: DC

## 2017-05-09 NOTE — Discharge Summary (Signed)
Physician Discharge Summary  Drew [name] QQV:[id number] DOB: [birth year] DOA: 05/07/2017  PCP: Patient, No Pcp Per  Admit date: 05/07/2017 Discharge date: 05/09/2017  Time spent: 45 minutes  Recommendations for Outpatient Follow-up:  Patient will be discharged to home with outpatient physical and occupational therapy.  Patient will need to follow up with primary care provider within one week of discharge, discuss diabetes and repeat BMP. Follow up with neurology in 6 weeks.  Patient should continue medications as prescribed.  Patient should follow a heart healthy/carb modofied  diet.   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 Roney Jaffe Drew [name] is a 41 y.o. male with no sig medical history, presented to [facility] ED today around 11 am.  He doesn't speak [place].  He reported R side facial, leg and arm weakness at an UC center and they referred him to the ED.  Head CT in ED was normal.  ED provider dx was stroke-like symptoms , presenting > 12 hrs after LKN.  Gave ASA and transferred to [facility] for further evaluation.   Here the patient is interviewed w/ his son interpreting.  He is having hard time keeping R eye open.  R leg and arm weakness are better now.  Numbness is better too.  Patient has hx a occipital headache, "really bad".  He has hx of headaches in the past , but never this one-sided weakness.   Pt smokes about 7 cigarettes per day.  Rare etoh use.  Works in [employer].  Wife and children are here tonight.  No history of any stroke, MI or other medical condition.  No home medications. No prior  hosp admits.   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 No brainstem infarct. -follow up with Dr. Leonie Man in 6 weeks.   Essential hypertension -Stable, currently on no home medications  Hyperlipidemia -Lipid panel: TC 258, HDL 39, LDL 143, triglycerides 379 -Continue statin  Diabetes mellitus, type II -Hemoglobin A1c 8 -Will start patient on metformin 500 mg twice a day, patient to follow-up with PCP and have repeat BMP in 1-2 weeks  Tobacco abuse -Discussed smoking cessation, continue nicotine patch  Procedures: Echocardiogram  Consultations: Neurology  Discharge Exam: Vitals:   05/09/17 0518 05/09/17 1022  BP: 126/84 (!) 168/91  Pulse: 97 87  Resp:  20  Temp: 98.1 F (36.7 C) 98.7 F (37.1 C)  SpO2: 97% 99%     General: Well developed, well nourished, NAD, appears stated age  HEENT: NCAT, mucous membranes moist. Facial droop/ptosis left  Cardiovascular: S1 S2 auscultated, no rubs, murmurs or gallops. Regular rate and rhythm.  Respiratory: Clear to auscultation bilaterally with  equal chest rise  Abdomen: Soft, nontender, nondistended, + bowel sounds  Extremities: warm dry without cyanosis clubbing or edema  Neuro: AAOx3, facial droop, otherwise nonfocal  Psych: Appropriate mood and affect  Discharge Instructions Discharge Instructions     Ambulatory referral to Neurology   Complete by:  As directed    An appointment is requested in approximately: 6 weeks Follow up with stroke clinic (Dr Leonie Man preferred, if not available, then consider [facility]nut, [facility] or Jaynee Eagles whoever is available) at [facility] in about 6-8 weeks. Thanks.   Ambulatory referral to Occupational Therapy   Complete by:  As directed    Ambulatory referral to Physical Therapy   Complete by:  As directed    Discharge instructions   Complete by:  As directed    Patient will be discharged to home with outpatient physical and occupational therapy.  Patient will need to follow up with primary care provider within one week of discharge, discuss diabetes and repeat BMP. Follow up with neurology in 6 weeks.  Patient should continue medications as prescribed.  Patient should follow a heart healthy/carb modofied  diet.     Allergies as of 05/09/2017   No Known Allergies     Medication List    TAKE these medications   acetaminophen 325 MG tablet Commonly known as:  TYLENOL Take 650 mg by mouth every 6 (six) hours as needed for mild pain or headache.   acyclovir 200 MG capsule Commonly known as:  ZOVIRAX Take 4 capsules (800 mg total) by mouth 3 (three) times daily.   artificial tears Oint ophthalmic ointment Commonly known as:  LACRILUBE Place into the right eye at bedtime.   aspirin 81 MG EC tablet Take 1 tablet (81 mg total) by mouth daily. Start taking on:  05/10/2017 What changed:    medication strength  how much to take   atorvastatin 80 MG tablet Commonly known as:  LIPITOR Take 1 tablet (80 mg total) by mouth daily at 6 PM.   metFORMIN 500 MG tablet Commonly known as:  GLUCOPHAGE Take 1 tablet (500 mg total) by mouth 2 (two) times daily with a meal.   methylPREDNISolone 4 MG Tbpk tablet Commonly known as:  MEDROL DOSEPAK Taper over 6 days   topiramate 50 MG tablet Commonly known as:  TOPAMAX Take 1 tablet (50 mg total) by mouth daily. Start  taking on:  05/10/2017      No Known Allergies Follow-up Information    Garvin Fila, MD. Schedule an appointment as soon as possible for a visit in 6 week(s).   Specialties:  Neurology, Radiology Contact information: [address] [place] [address] [place] Follow up on 05/21/2017.   Why:  Your appointment is at 1 pm. please arrive 15 min early and bring: picute ID and current medications. Contact information:  [address] [place], [place] [address]  [phone]        [facility] Follow up.   Why:  Please use this location for your medication needs.  Contact information: [place] [id number] [place] Follow up.   Specialty:  Rehabilitation Why:  they will contact you for the first appointment Contact information: [address] [place] [id number] [place] [phone]           The results of  significant diagnostics from this hospitalization (including imaging, microbiology, ancillary and laboratory) are listed below for reference.    Significant Diagnostic Studies: Ct Angio Head W Or Wo Contrast  Result Date: 05/08/2017 CLINICAL DATA:  41 y/o M; patient woke with right-sided face, arm, and leg weakness. EXAM: CT ANGIOGRAPHY HEAD AND NECK TECHNIQUE: Multidetector CT imaging of the head and neck was performed using the standard protocol during bolus administration of intravenous contrast. Multiplanar CT image reconstructions and MIPs were obtained to evaluate the vascular anatomy. Carotid stenosis measurements (when applicable) are obtained utilizing NASCET criteria, using the distal internal carotid diameter as the denominator. CONTRAST:  4mL ISOVUE-370 IOPAMIDOL (ISOVUE-370) INJECTION 76% COMPARISON:  05/08/2017 MRI of the head. FINDINGS: CTA NECK  FINDINGS Aortic arch: Standard branching. Imaged portion shows no evidence of aneurysm or dissection. No significant stenosis of the major arch vessel origins. Right carotid system: No evidence of dissection, stenosis (50% or greater) or occlusion. Left carotid system: No evidence of dissection, stenosis (50% or greater) or occlusion. Vertebral arteries: Codominant. No evidence of dissection, stenosis (50% or greater) or occlusion. Skeleton: Negative. Other neck: Negative. Upper chest: Negative. Review of the MIP images confirms the above findings CTA HEAD FINDINGS Anterior circulation: No significant stenosis, proximal occlusion, aneurysm, or vascular malformation. Posterior circulation: No significant stenosis, proximal occlusion, aneurysm, or vascular malformation. Venous sinuses: As permitted by contrast timing, patent. Anatomic variants: Patent anterior communicating artery. No posterior communicating artery identified, likely hypoplastic or absent. Delayed phase: No abnormal intracranial enhancement. Review of the MIP images confirms the above findings IMPRESSION: Negative CTA of head and neck. Electronically Signed   By: Kristine Garbe M.D.   On: 05/08/2017 04:45   Dg Chest 2 View  Result Date: 05/08/2017 CLINICAL DATA:  41 year old male presenting with stroke-like symptoms. Right side face leg and arm weakness. EXAM: CHEST  2 VIEW COMPARISON:  Brain MRI 0325 hours today.  Neck CTA 0421 hours today. FINDINGS: Semi upright AP and lateral views of the chest. Normal cardiac size and mediastinal contours. Visualized tracheal air column is within normal limits. Normal lung volumes. No pneumothorax, pulmonary edema, pleural effusion or confluent pulmonary opacity. No acute osseous abnormality identified. Negative visible bowel gas pattern. IMPRESSION: Negative.  No acute cardiopulmonary abnormality. Electronically Signed   By: Genevie Ann M.D.   On: 05/08/2017 07:13   Ct Head Wo Contrast  Result  Date: 05/07/2017 CLINICAL DATA:  Right facial, arm and leg weakness. EXAM: CT HEAD WITHOUT CONTRAST TECHNIQUE: Contiguous axial images were obtained from the base of the skull through the vertex without intravenous contrast. COMPARISON:  None. FINDINGS: Brain: No acute intracranial abnormality. Specifically, no hemorrhage, hydrocephalus, mass lesion, acute infarction, or significant intracranial injury. Vascular: No hyperdense vessel or unexpected calcification. Skull: No acute calvarial abnormality. Sinuses/Orbits: Visualized paranasal sinuses and mastoids clear. Orbital soft tissues unremarkable. Other: None IMPRESSION: Normal study. Electronically Signed   By: Rolm Baptise M.D.   On: 05/07/2017 11:43   Ct Angio Neck W Or Wo Contrast  Result Date: 05/08/2017 CLINICAL DATA:  41 y/o M; patient woke with right-sided face, arm, and leg weakness. EXAM: CT ANGIOGRAPHY HEAD AND NECK TECHNIQUE: Multidetector CT imaging of the head and neck was performed using the standard protocol during bolus administration of intravenous contrast. Multiplanar CT image reconstructions and MIPs were obtained to evaluate the vascular anatomy. Carotid stenosis measurements (when applicable) are obtained utilizing NASCET criteria, using the distal internal carotid diameter as the denominator. CONTRAST:  48mL ISOVUE-370 IOPAMIDOL (ISOVUE-370)  INJECTION 76% COMPARISON:  05/08/2017 MRI of the head. FINDINGS: CTA NECK FINDINGS Aortic arch: Standard branching. Imaged portion shows no evidence of aneurysm or dissection. No significant stenosis of the major arch vessel origins. Right carotid system: No evidence of dissection, stenosis (50% or greater) or occlusion. Left carotid system: No evidence of dissection, stenosis (50% or greater) or occlusion. Vertebral arteries: Codominant. No evidence of dissection, stenosis (50% or greater) or occlusion. Skeleton: Negative. Other neck: Negative. Upper chest: Negative. Review of the MIP images  confirms the above findings CTA HEAD FINDINGS Anterior circulation: No significant stenosis, proximal occlusion, aneurysm, or vascular malformation. Posterior circulation: No significant stenosis, proximal occlusion, aneurysm, or vascular malformation. Venous sinuses: As permitted by contrast timing, patent. Anatomic variants: Patent anterior communicating artery. No posterior communicating artery identified, likely hypoplastic or absent. Delayed phase: No abnormal intracranial enhancement. Review of the MIP images confirms the above findings IMPRESSION: Negative CTA of head and neck. Electronically Signed   By: Kristine Garbe M.D.   On: 05/08/2017 04:45   Mr Brain Wo Contrast  Result Date: 05/08/2017 CLINICAL DATA:  41 y/o M; right-sided facial, leg, and arm weakness. EXAM: MRI HEAD WITHOUT CONTRAST TECHNIQUE: Multiplanar, multiecho pulse sequences of the brain and surrounding structures were obtained without intravenous contrast. COMPARISON:  05/07/2017 CT head FINDINGS: Brain: No acute infarction, hemorrhage, hydrocephalus, extra-axial collection or mass lesion. Vascular: Normal flow voids. Skull and upper cervical spine: Normal marrow signal. Sinuses/Orbits: Negative. Other: None. IMPRESSION: Normal MRI of the brain. Electronically Signed   By: Kristine Garbe M.D.   On: 05/08/2017 04:11   Mr Face/trigeminal Wo/w Cm  Result Date: 05/09/2017 CLINICAL DATA:  Right-sided facial weakness.  Possible bowel palsy. EXAM: MRI FACE TRIGEMINAL WITHOUT AND WITH CONTRAST TECHNIQUE: Multiplanar, multiecho pulse sequences of the face and surrounding structures, including thin slice imaging of the course of the Trigeminal Nerves, were obtained both before and after administration of intravenous contrast. CONTRAST:  41mL MULTIHANCE GADOBENATE DIMEGLUMINE 529 MG/ML IV SOLN COMPARISON:  CTA head neck 05/08/2017 FINDINGS: There is no abnormal diffusion restriction within the brainstem. There is no  abnormal contrast enhancement along the courses of the facial or trigeminal nerves. Normal signal is maintained at both stylomastoid foramina. Foramina rotundum and ovale are normal. Meckel's cave is normal. The cisternal segments of the lower cranial nerves are normal. The visualized paranasal sinuses are clear. No mastoid or middle ear effusion. Visualized brain is normal. IMPRESSION: 1. No focal abnormality along the courses of the facial for trigeminal nerves. 2. No brainstem infarct. Electronically Signed   By: Ulyses Jarred M.D.   On: 05/09/2017 14:30    Microbiology: No results found for this or any previous visit (from the past 240 hour(s)).   Labs: Basic Metabolic Panel: Recent Labs  Lab 05/07/17 1113  NA 136  K 3.5  CL 102  CO2 24  GLUCOSE 216*  BUN 15  CREATININE 0.67  CALCIUM 9.3   Liver Function Tests: No results for input(s): AST, ALT, ALKPHOS, BILITOT, PROT, ALBUMIN in the last 168 hours. No results for input(s): LIPASE, AMYLASE in the last 168 hours. No results for input(s): AMMONIA in the last 168 hours. CBC: Recent Labs  Lab 05/07/17 1113  WBC 6.8  NEUTROABS 3.7  HGB 15.4  HCT 43.1  MCV 88.9  PLT 271   Cardiac Enzymes: No results for input(s): CKTOTAL, CKMB, CKMBINDEX, TROPONINI in the last 168 hours. BNP: BNP (last 3 results) No results for input(s): BNP in the last  8760 hours.  ProBNP (last 3 results) No results for input(s): PROBNP in the last 8760 hours.  CBG: No results for input(s): GLUCAP in the last 168 hours.     Signed:  Cristal Ford  [facility] 05/09/2017, 3:47 PM

## 2017-05-09 NOTE — Discharge Instructions (Signed)
Diagnstico de la diabetes mellitus tipo2 en los adultos (Type 2 Diabetes Mellitus, Diagnosis, Adult) La diabetes tipo2 (diabetes mellitus tipo2) es una enfermedad de larga duracin (crnica). Puede deberse a uno de [employer] o a ambos:  El cuerpo no produce la cantidad suficiente de una hormona llamada insulina.  El cuerpo no reacciona de forma normal a la insulina que produce. La insulina permite que los ciertos azcares (glucosa) ingresen a las clulas del cuerpo. Esto le proporciona la energa. Cuando se tiene diabetes tipo2, la glucosa no pueden ingresar a las clulas. Esto produce un aumento del nivel de glucosa en la sangre (hiperglucemia). El mdico fijar los objetivos del tratamiento para usted. Generalmente, los resultados de los niveles de glucosa en la sangre deben ser los siguientes:  Antes de las comidas (preprandial): de 80 a 130mg /dl (4,4 a 7,64mmol/l).  Despus de las comidas (posprandial): por debajo de 180mg /dl (80mmol/l).  Nivel deA1c (hemoglobinaA1c): menos del7%. CUIDADOS EN EL HOGAR Preguntas para hacerle al mdico Puede hacer las siguientes preguntas:  Debo reunirme con Radio broadcast assistant para el cuidado de la diabetes?  Dnde puedo encontrar un grupo de apoyo para personas diabticas?  Qu equipos necesitar para cuidarme en casa?  Qu medicamentos para la diabetes necesito? Cundo debo tomarlos?  Con qu frecuencia debo controlarme el nivel de glucosa en la sangre?  A qu nmero puedo llamar si tengo preguntas?  Cundo es la prxima cita con el mdico? Instrucciones generales  [employer] medicamentos de venta libre y los recetados solamente como se lo haya indicado el mdico.  Consulting civil engineer a todas las visitas de control como se lo haya indicado el mdico. Esto es importante. SOLICITE AYUDA SI:  El nivel de glucosa en la sangre es mayor o igual que 240mg /dl (13,29mmol/dl) durante 2das seguidos.  Ha estado enfermo o ha tenido fiebre  durante 2o ms das y no [place].  Si tiene alguno de estos problemas durante ms de 6horas: ? No puede comer ni beber. ? Siente malestar estomacal (nuseas). ? Vomita. ? La materia fecal es lquida (diarrea). SOLICITE AYUDA DE INMEDIATO SI:  El nivel de glucosa en la sangre est por debajo de 54mg /dl (53mmol/l).  Est confundido.  Tiene dificultad para hacer lo siguiente: ? Pensar con claridad. ? Respirar.  Tiene niveles moderados o altos de cetonas en la [place]. Esta informacin no tiene Marine scientist el consejo del mdico. Asegrese de hacerle al mdico cualquier pregunta que tenga. Document Released: 07/26/2008 Document Revised: 08/21/2015 Document Reviewed: 06/02/2015 Elsevier Interactive Patient Education  [employer].

## 2017-05-09 NOTE — Progress Notes (Signed)
Pt discharging home with self care. Pt has outpatient therapy at [facility]. Pt provided [place] letter to assist with the cost of his d/c medications.  CM set him up for PCP and he can use [facility] pharmacy after first 30 days. All information relayed to pt via interpretor services.  Wife to provide transportation home.

## 2017-05-09 NOTE — Progress Notes (Signed)
Physical Therapy Treatment Patient Details Name: Drew [name] MRN: [id number] DOB: 12-12-1975 Today's Date: 05/09/2017    History of Present Illness 41 year old male with facial numness and is having difficulty controlling his right eye and having numbness and weakness in right arm and leg.  Mild posterior headache reported. Neuro work up underway.    PT Comments    Patient is at mod I/Independent level for all mobility excluding stairs which pt need supervision for safety. Pt scored 22/24 this session. Vision appears to be most limiting factor for pt. Pt will continue to benefit from skilled PT.    Follow Up Recommendations  Outpatient PT     Equipment Recommendations  None recommended by PT    Recommendations for Other Services       Precautions / Restrictions Precautions Precautions: None Restrictions Weight Bearing Restrictions: No    Mobility  Bed Mobility Overal bed mobility: Independent                Transfers Overall transfer level: Modified independent Equipment used: None                Ambulation/Gait Ambulation/Gait assistance: Modified independent (Device/Increase time) Ambulation Distance (Feet): 450 Feet Assistive device: None Gait Pattern/deviations: WFL(Within Functional Limits)         Stairs     Stair Management: No rails;Alternating pattern;Forwards   General stair comments: slightly unsteady but to LOB  Wheelchair Mobility    Modified Rankin (Stroke Patients Only) Modified Rankin (Stroke Patients Only) Pre-Morbid Rankin Score: No symptoms Modified Rankin: Moderate disability     Balance Overall balance assessment: No apparent balance deficits (not formally assessed) Sitting-balance support: Feet supported;No upper extremity supported Sitting balance-Leahy Scale: Good     Standing balance support: No upper extremity supported;During functional activity Standing balance-Leahy Scale: Good                    Standardized Balance Assessment Standardized Balance Assessment : Dynamic Gait Index   Dynamic Gait Index Level Surface: Normal Change in Gait Speed: Normal Gait with Horizontal Head Turns: Normal Gait with Vertical Head Turns: Mild Impairment Gait and Pivot Turn: Normal Step Over Obstacle: Normal Step Around Obstacles: Mild Impairment Steps: Normal Total Score: 22      Cognition Arousal/Alertness: Awake/alert Behavior During Therapy: WFL for tasks assessed/performed Overall Cognitive Status: Within Functional Limits for tasks assessed                                        Exercises      General Comments General comments (skin integrity, edema, etc.): wife present and use of video interpreter      Pertinent Vitals/Pain Pain Assessment: Faces Faces Pain Scale: No hurt Pain Location: eye and face Pain Descriptors / Indicators: ("feels weird")    Home Living                      Prior Function            PT Goals (current goals can now be found in the care plan section) Acute Rehab PT Goals Patient Stated Goal: to go home PT Goal Formulation: With patient Time For Goal Achievement: 05/22/17 Potential to Achieve Goals: Good Progress towards PT goals: Progressing toward goals    Frequency    Min 3X/week      PT Plan Current plan remains appropriate  Co-evaluation              AM-PAC PT "6 Clicks" Daily Activity  Outcome Measure  Difficulty turning over in bed (including adjusting bedclothes, sheets and blankets)?: None Difficulty moving from lying on back to sitting on the side of the bed? : None Difficulty sitting down on and standing up from a chair with arms (e.g., wheelchair, bedside commode, etc,.)?: None Help needed moving to and from a bed to chair (including a wheelchair)?: None Help needed walking in hospital room?: None Help needed climbing 3-5 steps with a railing? : None 6 Click Score: 24    End of  Session Equipment Utilized During Treatment: Gait belt Activity Tolerance: Patient tolerated treatment well Patient left: in chair;with call bell/phone within reach;with family/visitor present Nurse Communication: Mobility status PT Visit Diagnosis: Unsteadiness on feet (R26.81);Other symptoms and signs involving the nervous system (R29.898)     Time: 4010-2725 PT Time Calculation (min) (ACUTE ONLY): 20 min  Charges:  $Gait Training: 8-22 mins                    G Codes:       Earney Navy, PTA Pager: [phone]     Darliss Cheney 05/09/2017, 4:47 PM

## 2017-05-09 NOTE — Plan of Care (Signed)
No acute events at this time. Pt remains very independent and is able to manage most of his own self care needs.

## 2017-05-09 NOTE — Progress Notes (Signed)
SLP Cancellation Note  Patient Details Name: Drew [name] MRN: [id number] DOB: 17-Feb-1976   Cancelled treatment:       Reason Eval/Treat Not Completed: Patient at procedure or test/unavailable; SLP attempted x2; will assess as able   Elvina Sidle, M.S., CCC-SLP 05/09/2017, 4:11 PM

## 2017-05-09 NOTE — Progress Notes (Signed)
NEUROHOSPITALISTS STROKE TEAM - DAILY PROGRESS NOTE   ADMISSION HISTORY: Drew [name] is a 41 y.o. male who awoke with right-sided weakness is morning.  It worsened over the course of a few hours, and then has been gradually improving since the time.  Currently is essentially back to normal, however he also has had facial weakness and numbness.  This is persistent.  This is left-sided.  He has been having occipital headaches for quite some time, but they have been worse over the past few days. LKW: 12/25 prior to bed tpa given?: no, outside of window  SUBJECTIVE (INTERVAL HISTORY)  Wife is at the bedside. Patient is found sitting in bedside chair in NAD. Overall he feels his condition is improving. Voices no new complaints. Headache and numbness have improved No new events reported overnight.  OBJECTIVE Lab Results: CBC:  Recent Labs  Lab 05/07/17 1113  WBC 6.8  HGB 15.4  HCT 43.1  MCV 88.9  PLT 271   BMP: Recent Labs  Lab 05/07/17 1113  NA 136  K 3.5  CL 102  CO2 24  GLUCOSE 216*  BUN 15  CREATININE 0.67  CALCIUM 9.3   Coagulation Studies:  Recent Labs    05/07/17 1113  APTT 27  INR 0.95   Urine Drug Screen:     Component Value Date/Time   LABOPIA NONE DETECTED 05/07/2017 1257   COCAINSCRNUR NONE DETECTED 05/07/2017 1257   LABBENZ NONE DETECTED 05/07/2017 1257   AMPHETMU NONE DETECTED 05/07/2017 1257   THCU NONE DETECTED 05/07/2017 1257   LABBARB NONE DETECTED 05/07/2017 1257    Alcohol Level:  Recent Labs  Lab 05/07/17 1113  ETH <10   PHYSICAL EXAM Temp:  [97.9 F (36.6 C)-99.5 F (37.5 C)] 98.7 F (37.1 C) (12/28 1022) Pulse Rate:  [70-97] 87 (12/28 1022) Resp:  [18-20] 20 (12/28 1022) BP: (126-168)/(82-92) 168/91 (12/28 1022) SpO2:  [96 %-99 %] 99 % (12/28 1022) General - Well nourished, well developed, in no apparent distress Respiratory - Lungs clear bilaterally. No  wheezing. Cardiovascular - Regular rate and rhythm  Neuro: Mental Status: Patient is awake, alert, oriented to person, place, month, year, and situation. Patient is able to give a clear and coherent history. No signs of aphasia or neglect Cranial Nerves: II: Visual Fields are full. Pupils are equal, round, and reactive to light.   III,IV, VI: EOMI without diploplia. +ptosis on left V: Facial sensation is  decreased on the left VII: Facial movement is decreased on the left up[per as well as lower face. Doll`s phenomenon is positive,   unable to close his left eye fully. VIII: hearing is intact to voice, decreased slightly on right X: Uvula elevates symmetrically XI: Shoulder shrug is symmetric. XII: tongue is midline without atrophy or fasciculations.  Motor: Tone is normal. Bulk is normal. 5/5 strength was present in all four extremities.  Sensory: Sensation and vibration is decreased on right. Cerebellar: FNF and HKS are slower on the right than on the left,  IMAGING: I have personally reviewed the radiological images below and agree with the radiology interpretations. Ct Angio Head and Neck W Or Wo Contrast Result Date: 05/08/2017 IMPRESSION: Negative CTA of head and neck.   Ct Head Wo Contrast Result Date: 05/07/2017 IMPRESSION: Normal study.   Mr Brain Wo Contrast Result Date: 05/08/2017 IMPRESSION: Normal MRI of the brain.   Echocardiogram:  Left ventricle: The cavity size was normal. Wall thickness was increased in a pattern of mild LVH. Systolic function was normal.The estimated ejection fraction was in the range of 55% to 60%.Wall motion was normal; there were no regional wall motion abnormalities. MRI Facial nerve protocoll:     No acute infarct     ASSESSMENT: Drew [name] is a 41 y.o. male with acute onset right sided weakness, left sided facial numbness and ptosis and intermittent occipital headache.  MRI negative  for stroke.  STROKE Ruled out:  Stroke Risk Factors: diabetes mellitus, hyperlipidemia and hypertension Other Stroke Risk Factors: Cigarette smoker  Outstanding Stroke Work-up Studies:       MRI Face Trigeminal:                        PENDING  05/09/2017: Neuro exam unchanged.  Repeat  MRI negative for stroke.  Patient's symptoms most likely Bell's palsy with atypical migraine.  Will rule out Lyme's disease.  Aspirin and Lipitor added for stroke prevention.   Likely discharge home on acyclovir and steroids and follow-up as outpatient  PLAN  05/09/2017: Continue Aspirin/ Statin  Continue steroids and acyclovir can artificial tears in the left eye for hydration during daytime and Lacri-Lube ointment at night  Ongoing aggressive stroke risk factor management Patient counseled to be compliant with his antithrombotic medications Patient counseled on Lifestyle modifications including, Diet, Exercise, and Stress Follow up with [facility] in 6 weeks  Bell's palsy Prednisone Dosepak MRI face trigeminal with and without contrast  Rule out Lyme disease Prophylactic acyclovir Lyme disease titers and antibodies  Atypical migraine Patient describes nightly headache 3-4 times per week without aura Topamax daily  HYPERTENSION: Stable Long term BP goal normotensive. May start home B/P medications, if applicable Home Meds: NONE  Newly diagnosed HYPERLIPIDEMIA:    Component Value Date/Time   CHOL 258 (H) 05/08/2017 0903   TRIG 379 (H) 05/08/2017 0903   HDL 39 (L) 05/08/2017 0903   CHOLHDL 6.6 05/08/2017 0903   VLDL 76 (H) 05/08/2017 0903   LDLCALC 143 (H) 05/08/2017 0903  Home Meds:  NONE LDL  goal < 70 Started on Lipitor to 80 mg daily Continue statin at discharge  Newly diagnosed DIABETES: Lab Results  Component Value Date   HGBA1C 8.0 (H) 05/08/2017  HgbA1c goal < 7.0 Currently on: Will need NovoLog Continue CBG monitoring and SSI to maintain glucose 140-180  mg/dl DM education   TOBACCO ABUSE Current smoker Smoking cessation counseling provided Nicotine patch provided  Other Active Problems: Principal Problem:   Stroke-like symptoms Active Problems:   Weakness   Right-sided muscle weakness   Right sided numbness   Headache   Non-English speaking patient  Hospital day # 0 VTE prophylaxis: Lovenox  Diet : Diet regular Room service appropriate? Yes; Fluid consistency: Thin   FAMILY UPDATES: family at bedside  TEAM UPDATES: Cristal Ford, DO     Prior Home Stroke Medications:  aspirin 325 mg daily  Discharge Stroke Meds:  Please discharge patient on TBD   Disposition: Final discharge disposition not confirmed Therapy Recs:               Outpatient PT OT Home Equipment:         None Follow Up:  Follow-up Information    Garvin Fila, MD. Schedule an appointment as soon as possible for a visit in 6 week(s).   Specialties:  Neurology, Radiology Contact information: [phone]  [place] [address] [phone]        [facility] Follow up on 05/21/2017.   Why:  Your appointment is at 1 pm. please arrive 15 min early and bring: picute ID and current medications. Contact information:  [address] [place], [place] [address]  [phone]       [facility] Follow up.   Why:  Please use this location for your medication needs.  Contact information: [place] [phone] [facility].   Specialty:  Rehabilitation Why:  they will contact you for the first appointment Contact information: [place] [phone]         Patient, No Pcp Per -PCP Follow up in 1-2 weeks   Case Management aware of need   .  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  Even though his clinical presentation suggest left pontine stroke MRI x 2 is negative.  He also has significant headaches which may suggest atypical migraine. Continue  Topamax for headache and prednisone and acyclovir for Bell's palsy. Follow  lab work for [employer] disease   Long discussion with the patient and wife at the bedside using [place] language interpreter and answered questions.  Discussed with Dr.Mikhail Greater than 50% time during this 25-minute visit was spent on counseling and coordination of care about his facial nerve palsy, numbness, headache and answering questions. Stroke team will sign off. Follow-up as an outpatient in the stroke clinic in 6 weeks.  Antony Contras, MD Medical Director [facility] Pager: [phone] 05/09/2017 1:25 PM  To contact Stroke Continuity provider, please refer to [email or web address] After hours, contact General Neurology

## 2017-05-09 NOTE — Progress Notes (Signed)
Discharge instructions, RX's and follow up appts explained and provided to patient and family verbalized understanding. Patient left floor via wheelchair accompanied by staff no c/o pain at d/c.  Levis Nazir Lynn, RN  

## 2017-05-09 NOTE — Progress Notes (Signed)
Occupational Therapy Treatment Patient Details Name: Drew [name] MRN: [id number] DOB: 12/25/1975 Today's Date: 05/09/2017    History of present illness 41 year old male with facial numness and is having difficulty controlling his right eye and having numbness and weakness in right arm and leg.  Mild posterior headache reported. Neuro work up underway.   OT comments  Pt with improvements in RUE deficits today; no coordination or sensation deficits noted. Pt continues to experience visual changes; reporting blurred vision up close and noted to have difficulty keeping R eye open. Pt reports blurred vision improves with L eye occluded. Pt overall mod I with ADL and functional mobility. Continue to recommend Neuro Outpatient OT for follow up to address visual deficits. Will continue to follow acutely.   Follow Up Recommendations  Outpatient OT(Neuro)    Equipment Recommendations  None recommended by OT    Recommendations for Other Services      Precautions / Restrictions Precautions Precautions: None Restrictions Weight Bearing Restrictions: No       Mobility Bed Mobility Overal bed mobility: Independent                Transfers Overall transfer level: Modified independent Equipment used: None                  Balance Overall balance assessment: No apparent balance deficits (not formally assessed)                                         ADL either performed or assessed with clinical judgement   ADL Overall ADL's : Modified independent                                       General ADL Comments: Pt reports improvement in RUE deficits--good coordination/strength/and sensation today.     Vision   Additional Comments: Pt reporting blurred vision up close with binocular vision. Improves with L eye occluded, does not improve with R eye occluded. Pt continues to have difficulty keeping R eye open and with occasional disconjugate  gaze.   Perception     Praxis      Cognition Arousal/Alertness: Awake/alert Behavior During Therapy: WFL for tasks assessed/performed Overall Cognitive Status: Within Functional Limits for tasks assessed                                          Exercises     Shoulder Instructions       General Comments      Pertinent Vitals/ Pain       Pain Assessment: Faces Faces Pain Scale: No hurt  Home Living                                          Prior Functioning/Environment              Frequency  Min 2X/week        Progress Toward Goals  OT Goals(current goals can now be found in the care plan section)  Progress towards OT goals: Progressing toward goals  Acute Rehab OT Goals Patient Stated Goal: to go home  OT Goal Formulation: With patient/family  Plan Discharge plan remains appropriate    Co-evaluation                 AM-PAC PT "6 Clicks" Daily Activity     Outcome Measure   Help from another person eating meals?: None Help from another person taking care of personal grooming?: None Help from another person toileting, which includes using toliet, bedpan, or urinal?: None Help from another person bathing (including washing, rinsing, drying)?: None Help from another person to put on and taking off regular upper body clothing?: None Help from another person to put on and taking off regular lower body clothing?: None 6 Click Score: 24    End of Session    OT Visit Diagnosis: Low vision, both eyes (H54.2)   Activity Tolerance Patient tolerated treatment well   Patient Left in bed;with call bell/phone within reach;with family/visitor present   Nurse Communication Mobility status        Time: 9604-5409 OT Time Calculation (min): 11 min  Charges: OT General Charges $OT Visit: 1 Visit OT Treatments $Therapeutic Activity: 8-22 mins  Shiann Kam A. Ulice Brilliant, M.S., OTR/L Pager: [place] 05/09/2017, 4:02 PM

## 2017-05-21 ENCOUNTER — Ambulatory Visit: Payer: Self-pay | Admitting: Family Medicine

## 2017-06-06 ENCOUNTER — Encounter: Payer: Self-pay | Admitting: Family Medicine

## 2017-06-06 ENCOUNTER — Ambulatory Visit (INDEPENDENT_AMBULATORY_CARE_PROVIDER_SITE_OTHER): Payer: Self-pay | Admitting: Family Medicine

## 2017-06-06 VITALS — BP 120/80 | HR 60 | Temp 98.3°F | Resp 16 | Ht 69.0 in | Wt 194.0 lb

## 2017-06-06 DIAGNOSIS — E119 Type 2 diabetes mellitus without complications: Secondary | ICD-10-CM

## 2017-06-06 DIAGNOSIS — E785 Hyperlipidemia, unspecified: Secondary | ICD-10-CM

## 2017-06-06 DIAGNOSIS — Z23 Encounter for immunization: Secondary | ICD-10-CM

## 2017-06-06 DIAGNOSIS — Z789 Other specified health status: Secondary | ICD-10-CM

## 2017-06-06 DIAGNOSIS — R531 Weakness: Secondary | ICD-10-CM

## 2017-06-06 LAB — POCT URINALYSIS DIP (DEVICE)
Bilirubin Urine: NEGATIVE
Glucose, UA: NEGATIVE mg/dL
Hgb urine dipstick: NEGATIVE
Ketones, ur: NEGATIVE mg/dL
Leukocytes, UA: NEGATIVE
NITRITE: NEGATIVE
PH: 7 (ref 5.0–8.0)
Protein, ur: NEGATIVE mg/dL
SPECIFIC GRAVITY, URINE: 1.015 (ref 1.005–1.030)
UROBILINOGEN UA: 0.2 mg/dL (ref 0.0–1.0)

## 2017-06-06 LAB — POCT GLYCOSYLATED HEMOGLOBIN (HGB A1C): Hemoglobin A1C: 7.9

## 2017-06-06 MED ORDER — LISINOPRIL 2.5 MG PO TABS: 2.5000 mg | ORAL_TABLET | Freq: Every day | ORAL | 5 refills | Status: DC

## 2017-06-06 MED ORDER — ATORVASTATIN CALCIUM 80 MG PO TABS: 80.0000 mg | ORAL_TABLET | Freq: Every day | ORAL | 5 refills | Status: DC

## 2017-06-06 MED ORDER — ASPIRIN 81 MG PO TBEC: 81.0000 mg | DELAYED_RELEASE_TABLET | Freq: Every day | ORAL | 5 refills | Status: DC

## 2017-06-06 MED FILL — ATORVASTATIN 80 MG TABLET: 80 | 30 days supply | Qty: 30 | Fill #0

## 2017-06-06 MED FILL — LISINOPRIL 2.5 MG TABLET: 2.5 | 30 days supply | Qty: 30 | Fill #0

## 2017-06-06 NOTE — Progress Notes (Signed)
Chief Complaint  Patient presents with  . Establish Care    follow up for stoke from er      Subjective:    Patient ID: Haidar [name], male    DOB: [birth year], 42 y.o.   MRN: [id number]  HPI  42 year old male presents accompanied by his son to establish care.  Patient primarily speaks Spanish, using video interpreter to assist with communication .  Patient was recently admitted to inpatient services on 05/07/2017 with right-sided weakness.  Patient presented to the emergency department with an inability to keep his right eye open.  Also, he experienced weakness to right upper and lower extremities.  A CT of the head was done at the time, which was unremarkable.  Patient also underwent an MRI of the face which showed no focal abnormality or brainstem infarct.  While admitted to inpatient services, patient was diagnosed with type 2 diabetes mellitus and hyperlipidemia.  He was discharged home on metformin 500 mg twice daily.  Also, patient was started on statin and ASA therapy.  He has a scheduled follow-up in neurology to evaluate right facial paralysis to rule out trigeminal neuralgia versus a complicated migraine headache. Patient denies headache, dizziness, blurred vision, neuropathy, polyuria, polydipsia, or polyphagia.   History reviewed. No pertinent past medical history.  Social History   Socioeconomic History  . Marital status: Married    Spouse name: Not on file  . Number of children: Not on file  . Years of education: Not on file  . Highest education level: Not on file  Social Needs  . Financial resource strain: Not on file  . Food insecurity - worry: Not on file  . Food insecurity - inability: Not on file  . Transportation needs - medical: Not on file  . Transportation needs - non-medical: Not on file  Occupational History  . Not on file  Tobacco Use  . Smoking status: Former Research scientist (life sciences)  . Smokeless tobacco: Never Used  Substance and Sexual Activity  . Alcohol use: Yes   Comment: occ  . Drug use: No  . Sexual activity: Not on file  Other Topics Concern  . Not on file  Social History Narrative  . Not on file   There is no immunization history on file for this patient. Review of Systems  Constitutional: Negative for fatigue and fever.  HENT: Negative.   Eyes: Negative.  Negative for photophobia and visual disturbance.  Respiratory: Negative.   Cardiovascular: Negative.   Gastrointestinal: Negative.   Endocrine: Negative.   Genitourinary: Negative.   Musculoskeletal: Negative.   Allergic/Immunologic: Negative for immunocompromised state.  Neurological: Positive for facial asymmetry (Right side asymetry. ). Negative for weakness and light-headedness.  Hematological: Negative.   Psychiatric/Behavioral: Negative.        Objective:   Physical Exam  Constitutional: He is oriented to person, place, and time.  HENT:  Head: Normocephalic and atraumatic.  Right Ear: External ear normal.  Left Ear: External ear normal.  Nose: Nose normal.  Mouth/Throat: Oropharynx is clear and moist.  Ptosis to right eye  Eyes: Pupils are equal, round, and reactive to light.  Neck: Normal range of motion.  Pulmonary/Chest: Effort normal and breath sounds normal.  Abdominal: Soft. Bowel sounds are normal.  Neurological: He is alert and oriented to person, place, and time. He displays normal reflexes. No cranial nerve deficit. He exhibits normal muscle tone. Coordination and gait normal.  Reflex Scores:      Tricep reflexes are 2+ on the right side  and 2+ on the left side.      Bicep reflexes are 2+ on the right side and 2+ on the left side.      Brachioradialis reflexes are 2+ on the right side and 2+ on the left side.      Patellar reflexes are 2+ on the right side and 2+ on the left side.      Achilles reflexes are 2+ on the right side and 2+ on the left side. Skin: Skin is warm and dry.  Psychiatric: He has a normal mood and affect. His behavior is normal.  Judgment and thought content normal. His mood appears not anxious. He does not exhibit a depressed mood.    BP 120/80 (BP Location: Left Arm, Patient Position: Sitting, Cuff Size: Normal)   Pulse 60   Temp 98.3 F (36.8 C) (Oral)   Resp 16   Ht 5\' 9"  (1.753 m)   Wt 194 lb (88 kg)   SpO2 100%   BMI 28.65 kg/m     Assessment & Plan:  1. Type 2 diabetes mellitus without complication, without long-term current use of insulin (HCC) Hemoglobin a1C is 7.9, which is above goal. Goal is < 7.  Will continue Metformin 500 mg twice daily.  - HgB A1c - Microalbumin/Creatinine Ratio, Urine - Basic Metabolic Panel - lisinopril (PRINIVIL,ZESTRIL) 2.5 MG tablet; Take 1 tablet (2.5 mg total) by mouth daily.  Dispense: 30 tablet; Refill: 5 - POCT urinalysis dip (device)  2. Hyperlipidemia LDL goal <100 The 10-year ASCVD risk score Mikey Bussing DC Jr., et al., 2013) is: 4.5%   Values used to calculate the score:     Age: 50 years     Sex: Male     Is Non-Hispanic African American: No     Diabetic: Yes     Tobacco smoker: No     Systolic Blood Pressure: 532 mmHg     Is BP treated: No     HDL Cholesterol: 39 mg/dL     Total Cholesterol: 258 mg/dL  - aspirin 81 MG EC tablet; Take 1 tablet (81 mg total) by mouth daily.  Dispense: 30 tablet; Refill: 5 - atorvastatin (LIPITOR) 80 MG tablet; Take 1 tablet (80 mg total) by mouth daily at 6 PM.  Dispense: 30 tablet; Refill: 5  3. Language barrier to communication Patient primarily speaks spanish, will utilize video interpreter to assist with communication  4. Immunization due - Pneumococcal polysaccharide vaccine 23-valent greater than or equal to 2yo subcutaneous/IM  5. Need for Tdap vaccination - Tdap vaccine greater than or equal to 7yo IM  6. Influenza vaccination given - Flu Vaccine QUAD 36+ mos IM (Fluarix & Fluzone Quad PF  7. Right sided weakness Patient to start outpatient physical and occupational therapy Follow up scheduled with  neurology Continue statin and ASA therapy   RTC: 3 months for DMII   [name]  MSN, FNP-C Patient [place] [address] [place], [place] [address] [phone]

## 2017-06-06 NOTE — Patient Instructions (Signed)
Will continue Metformin 500 mg  La diabetes mellitus y las normas bsicas de atencin mdica Diabetes Mellitus and Standards of Medical Care Tratar la diabetes (diabetes mellitus) puede ser complicado. Su tratamiento de la diabetes puede ser administrado por un equipo de profesionales de la salud, que incluye:  Un especialista en dietas y nutricin (nutricionista registrado).  Un enfermero.  Un educador certificado para el cuidado de la diabetes.  Un especialista en diabetes (endocrinlogo).  Un oculista.  Un mdico de cabecera.  Un dentista.  Sus mdicos siguen un programa para ayudarle a Engineer, production calidad en atencin. El programa siguiente es una gua general para su plan de control de la diabetes. Sus mdicos tambin podrn darle instrucciones ms especficas. Anlisis de HbA1c ( hemoglobina A1c) Este anlisis proporciona informacin sobre el control de la glucemia (glucosa en la sangre) durante los ltimos 2 o 11mses. Se utiliza para verificar si el plan de control de la diabetes debe ser modificado.  Si cumple los objetivos del t[place] este anlisis se realiza al mHalliburton Companyal ao.  Si no cumple los objetivos del tratamiento o si sus objetivos han cNepal este anlisis se realiza cuatro veces al ao.  Control de la presin arterial  Este control se realiza en cada visita mdica de rutina. Para la mComcast la meta es menos que 130/80. Consltele a su mdico cul es su meta para la presin arterial. Exmenes dentales y oculares  Visite al dAvayapor ao.  Si tiene diabetes tipo1, hgase un examen ocular en el trmino de 3 a 5aos despus del diagnstico y, luego, uArdelia Memsvez al ao despus del pTree surgeon ? Si le diagnosticaron diabetes tipo 1 siendo un nio, debe hacerse estudios al llegar a los 10 aos o ms y si ha tenido diabetes durante un perodo de 3 a 5 aos. Despus del primer examen, debe hacerse un examen ocular todos  los aos.  Si tiene diabetes tipo2, hgase un examen ocular tan pronto como le diagnostiquen la enfermedad y, l[place] una vez por ao despus del pTree surgeon Examen de los pies  Una inspeccin visual de pies se hace en cada visita mdica de rutina. En este examen se buscan cortes, moretones, enrojecimiento, ampollas, llagas u otros problemas en los pies.  Su mdico le rChartered certified accountantexamen de pies completo una vez por ao. Este examen incluye revisar la estructura y la piel de los pies, y examinar los pulsos y la sensacin de los pies. ? Diabetes tipo1: Hgase su primer examen en el trmino de 3 a 5 aos despus del diagnstico. ? Diabetes tipo2: Hgase su primer examen tan pronto como le diagnostiquen la enfermedad.  Examnese a diario los pies en busca de cortes, moretones, enrojecimiento, ampollas o llagas. Si tiene alguno de eKingstonu otros problemas y no se curan, pngase en contacto con su mdico. Estudio de la funcin renal ( microalbmina en orina)  Este estudio se realiza una vez por ao. ? Diabetes tipo1: Hgase su primer estudio cinco aos despus del diagnstico. ? Diabetes tipo2: Hgase su primer estudio tan pronto como le diagnostiquen la enfermedad.  Si tiene enfermedad renal crnica, hgase un examen de creatinina srica y de ndice de filtracin glomerular estimada (eGFR, por sus siglas en ingls) una vez por ao. Perfil lipdico (colesterol, HDL, LDL, triglicridos)  Este examen se debe realizar cuando le diagnostiquen diabetes y cada cinco aos luego del pTree surgeon Si est tomando medicamentos para bajar su  colesterol, es posible que se deba realizar este examen cada ao. ? En relacin al LDL, el objetivo es tener menos de 125m/dl (5,5 mmol/l). Si tiene aUnited Parcel el objetivo es tener menos de 70 mg/dl (3,9 mmol/l). ? En relacin al HDL, el objetivo es tener 40 mg/dl (2,2 mmol/l) para los hombres y 50 mg/dl (2,8 mmol/l) para las mujeres. Un nivel de colesterol  HDL de 60 mg/dl (3,3 mmol/l) o superior da una cierta proteccin contra la enfermedad cardaca. ? En relacin a los triglicridos, el objetivo es tener menos de 150 mg/dl (8,3 mmol/l). Vacunas  Se recomienda aplicar de forma anual la vacuna contra la gripe (influenza) a todas las personas de 624mes en adelante que tengan diabetes.  La vacuna contra la neumona (antineumoccica) est recomendada para todas las personas de 2aos en adelante que tengan diabetes. Si tiene 65aos o ms, puede recibir laEngineer, manufacturingomo una serie de dos inyecciones diWoods Bay Se recomienda administrar la vacuna contra la hepatitisB en adultos poco despus de que hayan recibido el diagnstico de diabetes.  La vacuna Tdap (contra el ttanos, la difteria y la tosferina) debe aplicarse de la siguiente manera: ? Segn las pautas normales de vacunacin infantil en el caso de los nios. ? Cada 10aos en el caso de los adultos con diabetes.  La vacuna contra la culebrilla (herpes zster) se recomienda en personas que han tenido varicela y que tiene 50110os de edad o ms. Salud mental y emocional  Se recomienda reOptometristontroles para deHydrographic surveyorntomas de trastornos de laYouth workeransiedad y depresin en el momento del diagnstico, y en una etapa posterior segn sea necesario. Si los controles revelan la presencia de sntomas (el resultado de los controles es positivo), es posible que deba someterse a evaluaciones posteriores y lo deriven a un mdico esRegulatory affairs officerEducacin para el autocontrol de la diabetes  Es recomendable que se informe sobre cmo controlar su diabetes en el momento de recibir el diagnstico y luWindbersegn sea necesario. Plan de tratamiento  Su plan de tratamiento ser revisado en cada visita mdica. Resumen  Tratar la diabetes (diabetes mellitus) puede ser complicado. Su tratamiento de la diabetes puede ser administrado por un equipo de profesionales de  laTechnical sales engineer Sus mdicos siguen un programa para ayudarle a obEngineer, productionalidad en atencin.  Las normas asistenciales incluyen realizarse, regularmente, exmenes fsicos, anlisis de sangre y controles de la presin arterial, aplicarse vacunas, someterse a estudios de control e informarse sobre cmo controlar su diabetes.  Sus mdicos tambin podrn darle instrucciones ms especficas basndose en su salud. Esta informacin no tiene coMarine scientistl consejo del mdico. Asegrese de hacerle al mdico cualquier pregunta que tenga. Document Released: 07/24/2009 Document Revised: 04/10/2016 Document Reviewed: 09/29/2012 Elsevier Interactive Patient Education  2018 ElReynolds AmericanDiabetes mellitus y nutricin Diabetes Mellitus and Nutrition Si sufre de diabetes (diabetes mellitus), es muy importante tener hbitos alimenticios saludables debido a que sus niveles de azDesigner, television/film setangre (glucosa) se ven afectados en gran medida por lo que come y bebe. [place] alimentos saludables en las cantidades adEdgefieldaproximadamente a la miUnited Technologies CorporationloColoradoyudar a:  CoAeronautical engineerlucemia.  Disminuir el riesgo de sufrir una enfermedad cardaca.  Mejorar la presin arterial.  AlScience writer mantener un peso saludable.  Todas las personas que sufren de diabetes son diferentes y cada una tiene necesidades diferentes en cuanto a un plan de alimentacin. El mdico puede recomendarle que  trabaje con un especialista en dietas y nutricin (nutricionista) para Financial trader plan para usted. Su plan de alimentacin puede variar segn factores como:  Las caloras que necesita.  Los medicamentos que toma.  Su peso.  Sus niveles de glucemia, presin arterial y colesterol.  Su nivel de [place].  Otras afecciones que tenga, como enfermedades cardacas o renales.  Cmo me afectan los carbohidratos? Los carbohidratos afectan el nivel de glucemia ms que cualquier otro tipo de alimento. La  ingesta de carbohidratos naturalmente aumenta la cantidad glucosa en la sangre. El recuento de carbohidratos es un mtodo destinado a Catering manager un registro de la cantidad de carbohidratos que se ingieren. El recuento de carbohidratos es importante para Theatre manager la glucemia a un nivel saludable, en especial si utiliza insulina o toma determinados medicamentos por va oral para la diabetes. Es importante saber la cantidad de carbohidratos que se pueden ingerir en cada comida sin correr Engineer, manufacturing. Esto es Psychologist, forensic. El nutricionista puede ayudarlo a calcular la cantidad de carbohidratos que debe ingerir en cada comida y colacin. Los alimentos que contienen carbohidratos incluyen:  Pan, cereal, arroz, pasta y galletas.  Papas y maz.  Guisantes, frijoles y lentejas.  Leche y Estate agent.  Lambert Mody y [place].  Postres, como pasteles, galletitas, helado y caramelos.  Cmo me afecta el alcohol? El alcohol puede provocar disminuciones sbitas de la glucemia (hipoglucemia), en especial si utiliza insulina o toma determinados medicamentos por va oral para la diabetes. La hipoglucemia es una afeccin potencialmente mortal. Los sntomas de la hipoglucemia (somnolencia, mareos y confusin) son similares a los sntomas de haber consumido demasiado alcohol. Si el mdico afirma que el alcohol es seguro para usted, siga estas pautas:  Limite el consumo de alcohol a no ms de 1 medida por da si es mujer y no est Music therapist, y a 2 medidas si es hombre. Una medida equivale a 12oz (377m) de cerveza, 5oz (1473m de vino o 1oz (4449mde bebidas de alta graduacin alcohlica.  No beba con el estmago vaco.  Mantngase hidratado con agua, gaseosas dietticas o t helado sin azcar.  Tenga en cuenta que las gaseosas comunes, los jugos y otros refrescos pueden contener mucha azcar y se deben contar como carbohidratos.  Consejos para seguir estPhotographers etiquetas de los  alimentos  Comience por controlar el tamao de la porcin en la etiqueta. La cantidad de caloras, carbohidratos, grasas y otros nutrientes mencionados en la etiqueta se basan en una porcin del alimento. Muchos alimentos contienen ms de una porcin por envase.  Verifique la cantidad total de gramos (g) de carbohidratos totales en una porcin. Puede calcular la cantidad de porciones de carbohidratos al dividir el total de carbohidratos por 15. Por ejemplo, si un alimento posee un total de 30g de carbohidratos, equivale a 2 porciones de carbohidratos.  Verifique la cantidad de gramos (g) de grasas saturadas y grasas trans en una porcin. Escoja alimentos que no contengan grasa o que tengan un bajo contenido.  Controle la cantidad de miligramos (mg) de sodio en una porcin. La mayState Farm las personas deben limitar la ingesta de sodio total a menos de 2300m28mr da. Training and development officeriempre consulte la informacin nutricional de los alimentos etiquetados como "con bajo contenido de grasa" o "sin grasa". Estos alimentos pueden ser ms altos en azcar agregada o en carbohidratos refinados y deben evitarse.  Hable con el nutricionista para identificar sus objetivos diarios en cuanto a los nutrientes mencionados en la etiqueta.  De compras  Evite comprar alimentos procesados, enlatados o prehechos. Estos alimentos tienden a TEFL teacher cantidad de [place], sodio y azcar agregada.  Compre en la zona exterior de la tienda de comestibles. Esta incluye frutas y Northrop Grumman, granos a granel, carnes frescas y productos lcteos frescos. Coccin  Utilice mtodos de coccin a baja temperatura, como hornear, en lugar de mtodos de coccin a alta temperatura, como frer en abundante aceite.  Cocine con aceites saludables, como el aceite de [place], canola o [place].  Evite cocinar con manteca, crema o carnes con alto contenido de grasa. Planificacin de las comidas  International Paper comidas y las colaciones de forma  regular, preferentemente a la misma hora todos [place]. Evite pasar largos perodos de tiempo sin comer.  Consuma alimentos ricos en fibra, como frutas frescas, verduras, frijoles y cereales integrales. Consulte al nutricionista sobre cuntas porciones de carbohidratos puede consumir en cada comida.  Consuma entre 4 y 6 onzas de protenas magras por da, como carnes [place], pollo, pescado, [employer] Data Corporation o tofu. 1 onza equivale a 1 onza de carne, pollo o pescado, 1 huevo, o 1/4 taza de tofu.  Coma algunos alimentos por da que contengan grasas saludables, como aguacates, frutos secos, semillas y pescado. Estilo de vida   Controle su nivel de glucemia con regularidad.  Haga ejercicio al menos 34mnutos, 5das o ms por semana, o como se lo haya indicado el mdico.  Tome los mTenneco Incse lo haya indicado el mdico.  No consuma ningn producto que contenga nicotina o tabaco, como cigarrillos y cPsychologist, sport and exercise Si necesita ayuda para dejar de fumar, consulte al mHess Corporationcon un asesor o instructor en diabetes para identificar estrategias para controlar el estrs y cualquier desafo emocional y social. Cules son algunas de las preguntas que puedo hacerle a mi mdico?  Es necesario que me rena con uRadio broadcast assistanten diabetes?  Es necesario que me rena con un nutricionista?  A qu nmero puedo llamar si tengo preguntas?  Cules son los mejores momentos para controlar la glucemia? Dnde encontrar ms informacin:  Asociacin Americana de la Diabetes (American Diabetes Association): diabetes.org/food-and-fitness/food  Academia de Nutricin y DInformation systems manager(Academy of Nutrition and Dietetics): wPokerClues.dk IAltoDiabetes y lPalos Verdes Estatesy RWater quality scientist([facility] LLCof Diabetes and Digestive and Kidney Diseases) (INew Union NIH):  wContactWire.beResumen  Un plan de alimentacin saludable lo ayudar a cAeronautical engineerglucemia y mTheatre managerun estilo de vida saludable.  Trabajar con un especialista en dietas y nutricin (nutricionista) puede ayudarlo a eInsurance claims handlerde alimentacin para usted.  Tenga en cuenta que los carbohidratos y el alcohol tienen efectos inmediatos en sus niveles de glucemia. Es importante contar los carbohidratos y consumir alcohol con prudencia. Esta informacin no tiene cMarine scientistel consejo del mdico. Asegrese de hacerle al mdico cualquier pregunta que tenga. Document Released: 08/06/2007 Document Revised: 08/19/2016 Document Reviewed: 08/19/2016 Elsevier Interactive Patient Education  2018 EReynolds American

## 2017-06-07 LAB — BASIC METABOLIC PANEL
BUN/Creatinine Ratio: 16 (ref 9–20)
BUN: 11 mg/dL (ref 6–24)
CHLORIDE: 108 mmol/L — AB (ref 96–106)
CO2: 20 mmol/L (ref 20–29)
CREATININE: 0.7 mg/dL — AB (ref 0.76–1.27)
Calcium: 8.9 mg/dL (ref 8.7–10.2)
GFR calc Af Amer: 136 mL/min/{1.73_m2} (ref >59)
GFR calc non Af Amer: 118 mL/min/{1.73_m2} (ref >59)
GLUCOSE: 137 mg/dL — AB (ref 65–99)
Potassium: 3.9 mmol/L (ref 3.5–5.2)
SODIUM: 142 mmol/L (ref 134–144)

## 2017-06-07 LAB — MICROALBUMIN / CREATININE URINE RATIO
Creatinine, Urine: 89.4 mg/dL
Microalb/Creat Ratio: <3.4 mg/g creat (ref 0.0–30.0)
Microalbumin, Urine: <3 ug/mL

## 2017-06-09 ENCOUNTER — Encounter: Payer: Self-pay | Admitting: Family Medicine

## 2017-06-12 ENCOUNTER — Telehealth: Payer: Self-pay

## 2017-06-12 NOTE — Telephone Encounter (Signed)
Called and spoke with patient using Drew [name] [phone]. Patient was asking about his hgba1c and if what he ate the day before would have caused it to be abnormal. I advised patient that this test shows the average blood sugar over a 3 month course and that what was eaten the day before should not change the results. Thanks!

## 2017-07-07 MED FILL — ATORVASTATIN 80 MG TABLET: 80 | 30 days supply | Qty: 30 | Fill #1

## 2017-07-07 MED FILL — ?LISINOPRIL 2.5 MG TABLET: 2.5 | 30 days supply | Qty: 30 | Fill #1

## 2017-07-09 ENCOUNTER — Encounter: Payer: Self-pay | Admitting: Neurology

## 2017-07-09 ENCOUNTER — Ambulatory Visit: Payer: Self-pay | Admitting: Neurology

## 2017-07-09 VITALS — BP 123/76 | HR 55 | Ht 69.0 in | Wt 181.0 lb

## 2017-07-09 DIAGNOSIS — G510 Bell's palsy: Secondary | ICD-10-CM

## 2017-07-09 MED ORDER — METFORMIN HCL 500 MG PO TABS: 500.0000 mg | ORAL_TABLET | Freq: Two times a day (BID) | ORAL | 0 refills | Status: DC

## 2017-07-09 MED FILL — ?METFORMIN HCL 500MG TABLET: 500 | 30 days supply | Qty: 60 | Fill #0

## 2017-07-09 NOTE — Progress Notes (Signed)
[facility] [address] [place]. [place] [address] [phone]       OFFICE FOLLOW-UP NOTE  [name] Date of Birth:  04-06-1976 Medical Record Number:  [id number]   HPI: [name] is a 42 year old Hispanic male seen today for first office follow-up visit for hospital admission in December 2018 for facial weakness. He is accompanied by his wife and a Spanish language interpreter. History is obtained from them as well as review of electronic medical records. I have personally reviewed imaging films.Drew [name] is a 42 y.o. male who awoke with right-sided weakness is morning.  It worsened over the course of a few hours, and then has been gradually improving since the time.  Currently is essentially back to normal, however he also has had facial weakness and numbness.  This is persistent.  This is left-sided.  He has been having occipital headaches for quite some time, but they have been worse over the past few days..LKW: 05/06/17 prior to bed.tpa given?: no, outside of window . Patient had history of headaches in the past but never as bad as when he presented during this episode of transient right-sided weakness which resolved fairly quickly. On exam he had peripheral seventh nerve weakness including forehead, eye closure and left cheek. CT head on admission is unremarkable. MRI scan of the brain done twice showed no definite evidence of stroke and facial nerve protocol showed no abnormal enhancement of the left facial or trigeminal nerve. Patient was felt to have Bell's palsy and started on prednisone as well as acyclovir and Topamax for headaches. According showed normal ejection fraction. Implement A1c was elevated at 8 and LDL cholesterol at 143 mg percent. CT angiogram of the brain and neck did not reveal any significant extracranial or intracranial large vessel stenosis or occlusion. Patient was prescribed dicyclomine and prednisone as well as Lacri-Lube ointment and artificial  tears. He states his done well since discharge. His diverticulosis and eyelids. He does report some intermittent twitching similar that of his face. He denies any dryness or redness of his eyes. He is tolerating aspirin well without bruising or bleeding. Starting Lipitor well without muscle aches and pains. Blood pressure is well controlled and today it is 120/76. Patient is apparently not taking his metformin given that this was prescribed. He has stopped Topamax as his headaches have gone. He has no complaints today.    ROS:   14 system review of systems is positive for  weight loss only and all the systems negative PMH:  Past Medical History:  Diagnosis Date  . Stroke ([place])   . Type 2 diabetes mellitus ([place])     Social History:  Social History   Socioeconomic History  . Marital status: Married    Spouse name: Not on file  . Number of children: Not on file  . Years of education: Not on file  . Highest education level: Not on file  Social Needs  . Financial resource strain: Not on file  . Food insecurity - worry: Not on file  . Food insecurity - inability: Not on file  . Transportation needs - medical: Not on file  . Transportation needs - non-medical: Not on file  Occupational History  . Not on file  Tobacco Use  . Smoking status: Former Research scientist (life sciences)  . Smokeless tobacco: Never Used  Substance and Sexual Activity  . Alcohol use: No    Frequency: Never  . Drug use: No  . Sexual activity: Not on file  Other Topics Concern  . Not on file  Social History Narrative  . Not on file    Medications:   Current Outpatient Medications on File Prior to Visit  Medication Sig Dispense Refill  . acetaminophen (TYLENOL) 325 MG tablet Take 650 mg by mouth every 6 (six) hours as needed for mild pain or headache.    Marland Kitchen aspirin 81 MG EC tablet Take 1 tablet (81 mg total) by mouth daily. 30 tablet 5  . atorvastatin (LIPITOR) 80 MG tablet Take 1 tablet (80 mg total) by mouth daily at 6 PM. 30  tablet 5  . lisinopril (PRINIVIL,ZESTRIL) 2.5 MG tablet Take 1 tablet (2.5 mg total) by mouth daily. 30 tablet 5  . topiramate (TOPAMAX) 50 MG tablet Take 1 tablet (50 mg total) by mouth daily. 30 tablet 0   No current facility-administered medications on file prior to visit.     Allergies:  No Known Allergies  Physical Exam General: well developed, well nourished young Hispanic male, seated, in no evident distress Head: head normocephalic and atraumatic.  Neck: supple with no carotid or supraclavicular bruits Cardiovascular: regular rate and rhythm, no murmurs Musculoskeletal: no deformity Skin:  no rash/petichiae Vascular:  Normal pulses all extremities Vitals:   07/09/17 1407  BP: 123/76  Pulse: (!) 55   Neurologic Exam Mental Status: Awake and fully alert. Oriented to place and time. Recent and remote memory intact. Attention span, concentration and fund of knowledge appropriate. Mood and affect appropriate.  Cranial Nerves: Fundoscopic exam reveals sharp disc margins. Pupils equal, briskly reactive to light. Extraocular movements full without nystagmus. Visual fields full to confrontation. Hearing intact. Facial sensation intact. Mild left peripheral facial nerve weakness with weakness of eye closure and cheek muscles tongue, palate moves normally and symmetrically.  Motor: Normal bulk and tone. Normal strength in all tested extremity muscles. Sensory.: intact to touch ,pinprick .position and vibratory sensation.  Coordination: Rapid alternating movements normal in all extremities. Finger-to-nose and heel-to-shin performed accurately bilaterally. Gait and Station: Arises from chair without difficulty. Stance is normal. Gait demonstrates normal stride length and balance . Able to heel, toe and tandem walk without difficulty.  Reflexes: 1+ and symmetric. Toes downgoing.   NIHSS  1 Modified [place]  1   ASSESSMENT: 42 year old Hispanic male with episode of left peripheral facial  weakness and right-sided paresthesias in December 2018. Marland Kitchen Unclear whether this represented a small brainstem infarct or peripheral seventh nerve palsy and responsive. MRI 2 negative for infarct or facial nerve pathology. Vascular risk factors of diabetes, hypertension and hyponatremia    PLAN: I had a long discussion with patient and his wife using a Spanish language interpreter about his recent episode of left-sided peripheral nerve palsy and numbness. It is unclear whether this represented a small brainstem infarct or peripheral seventh nerve palsy but he clearly seems to be improving. I recommend he stay on aspirin for stroke prevention and maintain strict control of hypertension with blood pressure goal below 130/90, lipids with LDL cholesterol goal below 70 mg percent and diabetes with hemoglobin A1c goal below 6.5%. Patient was given refill of metformin upon his request. I strongly encourage him to have medical follow-up at the [facility] clinic. No scheduled follow-up with me is necessary in the future. He may be referred back in the future as needed only. Greater than 50% of time during this 25 minute visit was spent on counseling,explanation of diagnosis of Bell's palsy, stroke, planning of further management, discussion with patient and  family and coordination of care Antony Contras, MD  [facility] [address] [place], [place] [address]  Phone [phone] Fax [phone] Note: This document was prepared with digital dictation and possible smart phrase technology. Any transcriptional errors that result from this process are unintentional

## 2017-07-09 NOTE — Patient Instructions (Signed)
I had a long discussion with patient and his wife using a Spanish language interpreter about his recent episode of left-sided peripheral nerve palsy and numbness. It is unclear whether this represented a small brainstem infarct or peripheral seventh nerve palsy but he clearly seems to be improving. I recommend he stay on aspirin for stroke prevention and maintain strict control of hypertension with blood pressure goal below 130/90, lipids with LDL cholesterol goal below 70 mg percent and diabetes with hemoglobin A1c goal below 6.5%. Patient was given refill of metformin upon his request. I strongly encourage him to have medical follow-up at the [facility] clinic. No scheduled follow-up with me is necessary in the future. He may be referred back in the future as needed only.

## 2017-08-01 ENCOUNTER — Ambulatory Visit: Payer: Self-pay | Attending: Family Medicine

## 2017-08-05 MED FILL — ATORVASTATIN 80 MG TABLET: 80 | 30 days supply | Qty: 30 | Fill #2

## 2017-08-05 MED FILL — LISINOPRIL 2.5 MG TABLET: 2.5 | 30 days supply | Qty: 30 | Fill #2

## 2017-08-13 ENCOUNTER — Encounter (HOSPITAL_BASED_OUTPATIENT_CLINIC_OR_DEPARTMENT_OTHER): Payer: Self-pay

## 2017-08-13 ENCOUNTER — Emergency Department (HOSPITAL_BASED_OUTPATIENT_CLINIC_OR_DEPARTMENT_OTHER)
Admission: EM | Admit: 2017-08-13 | Discharge: 2017-08-13 | Disposition: A | Discharge status: Home / Self Care | Payer: Self-pay | Attending: Emergency Medicine | Admitting: Emergency Medicine

## 2017-08-13 ENCOUNTER — Other Ambulatory Visit: Payer: Self-pay

## 2017-08-13 DIAGNOSIS — H9201 Otalgia, right ear: Secondary | ICD-10-CM | POA: Insufficient documentation

## 2017-08-13 DIAGNOSIS — E119 Type 2 diabetes mellitus without complications: Secondary | ICD-10-CM | POA: Insufficient documentation

## 2017-08-13 DIAGNOSIS — R531 Weakness: Secondary | ICD-10-CM | POA: Insufficient documentation

## 2017-08-13 DIAGNOSIS — Z8673 Personal history of transient ischemic attack (TIA), and cerebral infarction without residual deficits: Secondary | ICD-10-CM | POA: Insufficient documentation

## 2017-08-13 DIAGNOSIS — Z7982 Long term (current) use of aspirin: Secondary | ICD-10-CM | POA: Insufficient documentation

## 2017-08-13 DIAGNOSIS — Z7984 Long term (current) use of oral hypoglycemic drugs: Secondary | ICD-10-CM | POA: Insufficient documentation

## 2017-08-13 DIAGNOSIS — R42 Dizziness and giddiness: Secondary | ICD-10-CM | POA: Insufficient documentation

## 2017-08-13 DIAGNOSIS — R51 Headache: Secondary | ICD-10-CM | POA: Insufficient documentation

## 2017-08-13 DIAGNOSIS — Z87891 Personal history of nicotine dependence: Secondary | ICD-10-CM | POA: Insufficient documentation

## 2017-08-13 DIAGNOSIS — Z79899 Other long term (current) drug therapy: Secondary | ICD-10-CM | POA: Insufficient documentation

## 2017-08-13 DIAGNOSIS — R519 Headache, unspecified: Secondary | ICD-10-CM

## 2017-08-13 LAB — URINALYSIS, ROUTINE W REFLEX MICROSCOPIC
Bilirubin Urine: NEGATIVE
Glucose, UA: NEGATIVE mg/dL
Hgb urine dipstick: NEGATIVE
KETONES UR: NEGATIVE mg/dL
LEUKOCYTES UA: NEGATIVE
NITRITE: NEGATIVE
PROTEIN: NEGATIVE mg/dL
Specific Gravity, Urine: 1.01 (ref 1.005–1.030)
pH: 5.5 (ref 5.0–8.0)

## 2017-08-13 LAB — COMPREHENSIVE METABOLIC PANEL
ALT: 30 U/L (ref 17–63)
AST: 30 U/L (ref 15–41)
Albumin: 4.2 g/dL (ref 3.5–5.0)
Alkaline Phosphatase: 83 U/L (ref 38–126)
Anion gap: 10 (ref 5–15)
BILIRUBIN TOTAL: 1.1 mg/dL (ref 0.3–1.2)
BUN: 14 mg/dL (ref 6–20)
CALCIUM: 8.4 mg/dL — AB (ref 8.9–10.3)
CO2: 20 mmol/L — ABNORMAL LOW (ref 22–32)
Chloride: 106 mmol/L (ref 101–111)
Creatinine, Ser: 0.69 mg/dL (ref 0.61–1.24)
GFR calc Af Amer: >60 mL/min (ref >60)
Glucose, Bld: 95 mg/dL (ref 65–99)
Potassium: 3.4 mmol/L — ABNORMAL LOW (ref 3.5–5.1)
Sodium: 136 mmol/L (ref 135–145)
TOTAL PROTEIN: 7.1 g/dL (ref 6.5–8.1)

## 2017-08-13 LAB — CBC
HCT: 40.2 % (ref 39.0–52.0)
Hemoglobin: 14.7 g/dL (ref 13.0–17.0)
MCH: 33.2 pg (ref 26.0–34.0)
MCHC: 36.6 g/dL — ABNORMAL HIGH (ref 30.0–36.0)
MCV: 90.7 fL (ref 78.0–100.0)
Platelets: 209 10*3/uL (ref 150–400)
RBC: 4.43 MIL/uL (ref 4.22–5.81)
RDW: 12 % (ref 11.5–15.5)
WBC: 4.2 10*3/uL (ref 4.0–10.5)

## 2017-08-13 MED ORDER — DIPHENHYDRAMINE HCL 50 MG/ML IJ SOLN
25.0000 mg | Freq: Once | INTRAMUSCULAR | Status: AC
  Administered 2017-08-13: 25 mg via INTRAVENOUS
  Filled 2017-08-13: qty 1

## 2017-08-13 MED ORDER — KETOROLAC TROMETHAMINE 30 MG/ML IJ SOLN
30.0000 mg | Freq: Once | INTRAMUSCULAR | Status: AC
  Administered 2017-08-13: 30 mg via INTRAVENOUS
  Filled 2017-08-13: qty 1

## 2017-08-13 MED ORDER — PROCHLORPERAZINE EDISYLATE 5 MG/ML IJ SOLN
10.0000 mg | Freq: Once | INTRAMUSCULAR | Status: AC
  Administered 2017-08-13: 10 mg via INTRAVENOUS
  Filled 2017-08-13: qty 2

## 2017-08-13 MED ORDER — MECLIZINE HCL 25 MG PO TABS: 25.0000 mg | ORAL_TABLET | Freq: Three times a day (TID) | ORAL | 0 refills | Status: DC | PRN

## 2017-08-13 MED ORDER — DIPHENHYDRAMINE HCL 12.5 MG/5ML PO ELIX: 25.0000 mg | ORAL_SOLUTION | Freq: Once | ORAL | Status: DC

## 2017-08-13 MED ORDER — TOPIRAMATE 50 MG PO TABS: 50.0000 mg | ORAL_TABLET | Freq: Every day | ORAL | 0 refills | Status: DC

## 2017-08-13 MED ORDER — SODIUM CHLORIDE 0.9 % IV BOLUS
1000.0000 mL | Freq: Once | INTRAVENOUS | Status: AC
  Administered 2017-08-13: 1000 mL via INTRAVENOUS

## 2017-08-13 MED ORDER — MECLIZINE HCL 25 MG PO TABS
25.0000 mg | ORAL_TABLET | Freq: Once | ORAL | Status: AC
  Administered 2017-08-13: 25 mg via ORAL
  Filled 2017-08-13: qty 1

## 2017-08-13 MED ORDER — METFORMIN HCL 500 MG PO TABS: 500.0000 mg | ORAL_TABLET | Freq: Two times a day (BID) | ORAL | 0 refills | Status: DC

## 2017-08-13 NOTE — ED Provider Notes (Signed)
[facility] EMERGENCY DEPARTMENT Provider Note   CSN: [id number] Arrival date & time: 08/13/17  1434     History   Chief Complaint Chief Complaint  Patient presents with  . Near Syncope    HPI Drew [name] is a 42 y.o. male with a h/o of DM Type II who presents to the emergency department with a chief complaint of headache.  Patient endorses a constant, non-radiating occipital HA, onset this morning. Worsening since onset. He endorses associated generalized weakness and intermittent dizziness. He states that he feels lightheaded, "like I might pass out". His eyes feels tired, and he can't taste well. Reports he was feeling generally unwell and subjective fever yesterday prior to the onset of symptoms today. Dizziness is worse with standing and when he moves his head. Resolves within seconds. He also endorses right otalgia. No tinnitus or left otalgia.   He denies CP, dyspnea, chills, myalgias, abdominal pain, N/V/D, back pain, neck pain or stiffness, visual changes, numbness, or focal weakness.   He was hospitalized on 12/25-28   He has been out of his metformin for the last 5 days. He has been compliant with his home atorvastatin, ASA, and lisinopril.   UTD on his influenza vaccination. No known sick contacts. He went to his job in [employer] earlier today, but left at noon due to his symptoms.   The history is provided by the patient. No language interpreter was used.    Past Medical History:  Diagnosis Date  . Stroke ([place])   . Type 2 diabetes mellitus [facility])     Patient Active Problem List   Diagnosis Date Noted  . Right-sided muscle weakness 05/08/2017  . Right sided numbness 05/08/2017  . Headache 05/08/2017  . Stroke-like symptoms 05/08/2017  . Non-English speaking patient 05/08/2017  . Weakness 05/07/2017    Past Surgical History:  Procedure Laterality Date  . APPENDECTOMY          Home Medications    Prior to Admission medications     Medication Sig Start Date End Date Taking? Authorizing Provider  acetaminophen (TYLENOL) 325 MG tablet Take 650 mg by mouth every 6 (six) hours as needed for mild pain or headache.    [provider]  aspirin 81 MG EC tablet Take 1 tablet (81 mg total) by mouth daily. 06/06/17   Dorena Dew, FNP  atorvastatin (LIPITOR) 80 MG tablet Take 1 tablet (80 mg total) by mouth daily at 6 PM. 06/06/17   Dorena Dew, FNP  lisinopril (PRINIVIL,ZESTRIL) 2.5 MG tablet Take 1 tablet (2.5 mg total) by mouth daily. 06/06/17   Dorena Dew, FNP  meclizine (ANTIVERT) 25 MG tablet Take 1 tablet (25 mg total) by mouth 3 (three) times daily as needed for dizziness. 08/13/17   Florean Hoobler A, PA-C  metFORMIN (GLUCOPHAGE) 500 MG tablet Take 1 tablet (500 mg total) by mouth 2 (two) times daily with a meal. 08/13/17 09/12/17  Lemario Chaikin A, PA-C  topiramate (TOPAMAX) 50 MG tablet Take 1 tablet (50 mg total) by mouth daily. 08/13/17   Taden Witter A, PA-C    Family History No family history on file.  Social History Social History   Tobacco Use  . Smoking status: Former Research scientist (life sciences)  . Smokeless tobacco: Never Used  Substance Use Topics  . Alcohol use: No    Frequency: Never  . Drug use: No     Allergies   Patient has no known allergies.   Review of Systems Review  of Systems  Constitutional: Negative for appetite change, chills and fever.  HENT: Positive for ear pain. Negative for congestion, ear discharge, hearing loss, sinus pressure, sinus pain, sore throat and tinnitus.   Eyes: Negative for visual disturbance.  Respiratory: Negative for shortness of breath.   Cardiovascular: Negative for chest pain.  Gastrointestinal: Negative for abdominal pain, diarrhea, nausea and vomiting.  Endocrine: Negative for polyphagia and polyuria.  Genitourinary: Negative for dysuria.  Musculoskeletal: Negative for back pain, neck pain and neck stiffness.  Skin: Negative for rash.  Allergic/Immunologic:  Negative for immunocompromised state.  Neurological: Positive for dizziness, weakness (generalized), light-headedness and headaches. Negative for syncope and numbness.  Psychiatric/Behavioral: Negative for confusion.     Physical Exam Updated Vital Signs BP 110/73 (BP Location: Right Arm)   Pulse 60   Temp 99.1 F (37.3 C) (Oral)   Resp 20   Wt 80 kg (176 lb 7 oz)   SpO2 98%   BMI 26.06 kg/m   Physical Exam  Constitutional: He is oriented to person, place, and time. He appears well-developed. No distress.  HENT:  Head: Normocephalic and atraumatic.  Right Ear: Hearing, tympanic membrane and ear canal normal. No mastoid tenderness.  Left Ear: Hearing, tympanic membrane and ear canal normal. No mastoid tenderness.  Nose: Nose normal. Right sinus exhibits no maxillary sinus tenderness and no frontal sinus tenderness. Left sinus exhibits no maxillary sinus tenderness and no frontal sinus tenderness.  Mouth/Throat: Uvula is midline, oropharynx is clear and moist and mucous membranes are normal. No tonsillar exudate.  Eyes: Pupils are equal, round, and reactive to light. Conjunctivae and EOM are normal.  No nystagmus bilaterally.   Neck: Normal range of motion. Neck supple.  No meningismus.   Cardiovascular: Normal rate, regular rhythm, normal heart sounds and intact distal pulses. Exam reveals no gallop and no friction rub.  No murmur heard. Pulmonary/Chest: Effort normal. No stridor. No respiratory distress. He has no wheezes. He has no rales. He exhibits no tenderness.  Abdominal: Soft. He exhibits no distension and no mass. There is no tenderness. There is no rebound and no guarding. No hernia.  Musculoskeletal: Normal range of motion. He exhibits no edema, tenderness or deformity.  Lymphadenopathy:    He has no cervical adenopathy.  Neurological: He is alert and oriented to person, place, and time. He has normal strength. No sensory deficit. He displays a negative Romberg sign.  GCS eye subscore is 4. GCS verbal subscore is 5. GCS motor subscore is 6.  Cranial nerves 2-12 grossly intact. Finger-to-nose is normal. 5/5 motor strength of the bilateral upper and lower extremities. Moves all four extremities. No pronator drift. Negative Romberg. Ambulatory without difficulty. NVI.    Skin: Skin is warm and dry. Capillary refill takes less than 2 seconds. No rash noted. He is not diaphoretic.  Psychiatric: His behavior is normal.  Nursing note and vitals reviewed.    ED Treatments / Results  Labs (all labs ordered are listed, but only abnormal results are displayed) Labs Reviewed  COMPREHENSIVE METABOLIC PANEL - Abnormal; Notable for the following components:      Result Value   Potassium 3.4 (*)    CO2 20 (*)    Calcium 8.4 (*)    All other components within normal limits  CBC - Abnormal; Notable for the following components:   MCHC 36.6 (*)    All other components within normal limits  URINALYSIS, ROUTINE W REFLEX MICROSCOPIC    EKG None  Radiology No results  found.  Procedures Procedures (including critical care time)  Medications Ordered in ED Medications  meclizine (ANTIVERT) tablet 25 mg (25 mg Oral Given 08/13/17 1553)  ketorolac (TORADOL) 30 MG/ML injection 30 mg (30 mg Intravenous Given 08/13/17 1723)  prochlorperazine (COMPAZINE) injection 10 mg (10 mg Intravenous Given 08/13/17 1726)  sodium chloride 0.9 % bolus 1,000 mL (0 mLs Intravenous Stopped 08/13/17 1814)  diphenhydrAMINE (BENADRYL) injection 25 mg (25 mg Intravenous Given 08/13/17 1724)     Initial Impression / Assessment and Plan / ED Course  I have reviewed the triage vital signs and the nursing notes.  Pertinent labs & imaging results that were available during my care of the patient were reviewed by me and considered in my medical decision making (see chart for details).      42 year old male presenting with headache, dizziness, lightheadedness, right otalgia, generalized  weakness.  He was hospitalized in December for right-sided weakness and was discharged to follow-up with neurology with Dr. Leonie Man.  He was diagnosed with atypical migraine versus Bell's palsy versus question of Lyme disease versus 7th nerve palsy.  CT angio was negative.  MRI of the brain repeated twice showed no definite evidence of stroke and facial nerve protocol was negative for abnormal enhancement of the left facial or trigeminal nerve.  Patient was started on Topamax for headaches and acyclovir for possible Bell's palsy.  He was seen on 07/09/2017 by Dr. Leonie Man and had no complaints at that time.  On physical exam, no focal neurologic deficits.  HEENT exam is unremarkable.  Dizziness is suspicious for BPPV and resolved after meclizine.  He continued to endorse headache, which resolved after migraine cocktail of Toradol, Compazine, and Benadryl.  Given his exam, doubt ICH, SAH, intracranial mass, DKA,  meningitis or URI.  I have discussed with the patient and his family that he should follow back up with primary care for a recheck of his blood sugar since it was normal today and he has been out of his metformin for the last 5 days and with Dr. Leonie Man if headaches return or worsen.  He was given strict return precautions to return to the emergency department.  He is hemodynamically stable.  Headache and all other symptoms have resolved prior to discharge.  The patient is safe for discharge to home with outpatient follow-up at this time.   Final Clinical Impressions(s) / ED Diagnoses   Final diagnoses:  Bad headache    ED Discharge Orders        Ordered    meclizine (ANTIVERT) 25 MG tablet  3 times daily PRN     08/13/17 1831    topiramate (TOPAMAX) 50 MG tablet  Daily     08/13/17 1831    metFORMIN (GLUCOPHAGE) 500 MG tablet  2 times daily with meals     08/13/17 1832       Kmari Halter A, PA-C 08/13/17 [place], Dan, DO 08/13/17 2110

## 2017-08-13 NOTE — Discharge Instructions (Signed)
Take one tablet of Topamax daily to prevent headaches.   Take one tablet of meclizine every 8 hours as needed for dizziness.   Please schedule a follow up appointment with your primary care provider to get your medications refills. Your blood sugar was normal today. I have given you a 30-day prescription of your Metformin, but please follow up with primary care to have your bloodwork rechecked within the next week  If you develop new or worsening symptoms, including the worst headache of your life, a headache with chest pain, shortness of breath, double vision, vomiting, or other new concerning symptoms, please return to the Emergency Department for re-evaluation.

## 2017-08-13 NOTE — ED Notes (Signed)
Pt reports no change in sxs after receiving Meclizine; sts he just wants to close his eyes.

## 2017-08-13 NOTE — ED Triage Notes (Signed)
C/o feeling like he was going to pass out, weak, pain to head and both LE-sx started yesterday at [facility] gait-son is interpreting

## 2017-08-14 MED FILL — TOPIRAMATE 50 MG TABLET: 50 | 30 days supply | Qty: 30 | Fill #0

## 2017-08-14 MED FILL — ?METFORMIN HCL 500MG TABLET: 500 | 30 days supply | Qty: 60 | Fill #0

## 2017-08-14 MED FILL — MECLIZINE 25 MG TABLET: 25 | 10 days supply | Qty: 30 | Fill #0

## 2017-09-04 ENCOUNTER — Ambulatory Visit (INDEPENDENT_AMBULATORY_CARE_PROVIDER_SITE_OTHER): Payer: Self-pay | Admitting: Family Medicine

## 2017-09-04 ENCOUNTER — Encounter: Payer: Self-pay | Admitting: Family Medicine

## 2017-09-04 VITALS — BP 107/71 | HR 48 | Temp 98.2°F | Resp 16 | Ht 69.0 in | Wt 175.0 lb

## 2017-09-04 DIAGNOSIS — E119 Type 2 diabetes mellitus without complications: Secondary | ICD-10-CM

## 2017-09-04 DIAGNOSIS — E785 Hyperlipidemia, unspecified: Secondary | ICD-10-CM

## 2017-09-04 DIAGNOSIS — Z789 Other specified health status: Secondary | ICD-10-CM

## 2017-09-04 DIAGNOSIS — J301 Allergic rhinitis due to pollen: Secondary | ICD-10-CM

## 2017-09-04 LAB — POCT URINALYSIS DIPSTICK
Bilirubin, UA: NEGATIVE
Blood, UA: NEGATIVE
Glucose, UA: NEGATIVE
KETONES UA: NEGATIVE
LEUKOCYTES UA: NEGATIVE
NITRITE UA: NEGATIVE
PROTEIN UA: NEGATIVE
UROBILINOGEN UA: 0.2 U/dL
pH, UA: 5.5 (ref 5.0–8.0)

## 2017-09-04 LAB — POCT GLYCOSYLATED HEMOGLOBIN (HGB A1C): Hemoglobin A1C: 5.5

## 2017-09-04 MED ORDER — FLUTICASONE PROPIONATE 50 MCG/ACT NA SUSP: 2.0000 | Freq: Every day | NASAL | 6 refills | Status: DC

## 2017-09-04 MED ORDER — METFORMIN HCL 500 MG PO TABS: 500.0000 mg | ORAL_TABLET | Freq: Every day | ORAL | 2 refills | Status: DC

## 2017-09-04 MED ORDER — CETIRIZINE HCL 10 MG PO TABS: 10.0000 mg | ORAL_TABLET | Freq: Every day | ORAL | 11 refills | Status: DC

## 2017-09-04 MED FILL — ?CETIRIZINE HCL 10 MG TABLE: 10 | 30 days supply | Qty: 30 | Fill #0

## 2017-09-04 MED FILL — ?METFORMIN HCL 500MG TABLET: 500 | 30 days supply | Qty: 30 | Fill #0

## 2017-09-04 MED FILL — FLUTICASONE PROP 50 MCG SPR: 50 | 30 days supply | Qty: 16 | Fill #0

## 2017-09-04 NOTE — Progress Notes (Signed)
Chief Complaint  Patient presents with  . Diabetes     Subjective:    Patient ID: Drew [name], male    DOB: [birth year], 42 y.o.   MRN: [id number]  HPI  42 year old male presents accompanied by his wife for a follow up of chronic conditions .  Patient primarily speaks Spanish, using video interpreter to assist with communication .  Patient was previously admitted to inpatient services on 05/07/2017 with right-sided weakness.  Patient presented to the emergency department with an inability to keep his right eye open.  Also, he experienced weakness to right upper and lower extremities.  A CT of the head was done at the time, which was unremarkable.  Patient also underwent an MRI of the face which showed no focal abnormality or brainstem infarct.  While admitted to inpatient services, patient was diagnosed with type 2 diabetes mellitus and hyperlipidemia.  He was discharged home on metformin 500 mg twice daily.  Also, patient was started on statin and ASA therapy.  Drew [name] was evaluated by outpatient neurology and advised to follow up as needed.  Patient says that he feels well and is without complaint. He has been taking medications consistently and has been following a balanced diet. Patient also exercises routinely.  Patient denies headache, dizziness, blurred vision, neuropathy, polyuria, polydipsia, or polyphagia.   Past Medical History:  Diagnosis Date  . Stroke ([place])   . Type 2 diabetes mellitus ([place])     Social History   Socioeconomic History  . Marital status: Married    Spouse name: Not on file  . Number of children: Not on file  . Years of education: Not on file  . Highest education level: Not on file  Occupational History  . Not on file  Social Needs  . Financial resource strain: Not on file  . Food insecurity:    Worry: Not on file    Inability: Not on file  . Transportation needs:    Medical: Not on file    Non-medical: Not on file  Tobacco Use  . Smoking status:  Former Research scientist (life sciences)  . Smokeless tobacco: Never Used  Substance and Sexual Activity  . Alcohol use: No    Frequency: Never  . Drug use: No  . Sexual activity: Not on file  Lifestyle  . Physical activity:    Days per week: Not on file    Minutes per session: Not on file  . Stress: Not on file  Relationships  . Social connections:    Talks on phone: Not on file    Gets together: Not on file    Attends religious service: Not on file    Active member of club or organization: Not on file    Attends meetings of clubs or organizations: Not on file    Relationship status: Not on file  . Intimate partner violence:    Fear of current or ex partner: Not on file    Emotionally abused: Not on file    Physically abused: Not on file    Forced sexual activity: Not on file  Other Topics Concern  . Not on file  Social History Narrative  . Not on file   Immunization History  Administered Date(s) Administered  . Influenza,inj,Quad PF,6+ Mos 06/06/2017  . Pneumococcal Polysaccharide-23 06/06/2017  . Tdap 06/06/2017   Review of Systems  Constitutional: Negative for fatigue and fever.  HENT: Negative.   Eyes: Negative.  Negative for photophobia and visual disturbance.  Respiratory: Negative.  Cardiovascular: Negative.   Gastrointestinal: Negative.   Endocrine: Negative.   Genitourinary: Negative.   Musculoskeletal: Negative.   Allergic/Immunologic: Positive for environmental allergies. Negative for immunocompromised state.  Neurological: Negative for weakness and light-headedness.  Hematological: Negative.   Psychiatric/Behavioral: Negative.        Objective:   Physical Exam  Constitutional: He is oriented to person, place, and time.  HENT:  Head: Normocephalic and atraumatic.  Right Ear: External ear normal. Tympanic membrane is erythematous.  Left Ear: External ear normal. Tympanic membrane is erythematous.  Nose: Mucosal edema present.  Mouth/Throat: Oropharynx is clear and moist.   Dull TM  Eyes: Pupils are equal, round, and reactive to light.  Neck: Normal range of motion.  Pulmonary/Chest: Effort normal and breath sounds normal.  Abdominal: Soft. Bowel sounds are normal.  Neurological: He is alert and oriented to person, place, and time. He displays normal reflexes. No cranial nerve deficit. He exhibits normal muscle tone. Coordination and gait normal.  Reflex Scores:      Tricep reflexes are 2+ on the right side and 2+ on the left side.      Bicep reflexes are 2+ on the right side and 2+ on the left side.      Brachioradialis reflexes are 2+ on the right side and 2+ on the left side.      Patellar reflexes are 2+ on the right side and 2+ on the left side.      Achilles reflexes are 2+ on the right side and 2+ on the left side. Skin: Skin is warm and dry.  Psychiatric: He has a normal mood and affect. His behavior is normal. Judgment and thought content normal. His mood appears not anxious. He does not exhibit a depressed mood.    BP 107/71 (BP Location: Right Arm, Patient Position: Sitting, Cuff Size: Normal)   Pulse (!) 48   Temp 98.2 F (36.8 C) (Oral)   Resp 16   Ht 5\' 9"  (1.753 m)   Wt 175 lb (79.4 kg)   SpO2 100%   BMI 25.84 kg/m     Assessment & Plan:  1. Type 2 diabetes mellitus without complication, without long-term current use of insulin (HCC) Hemoglobin a1C has improved to 5.3, will decrease metformin to 500 mg daily. Recommend a lowfat, low carbohydrate diet divided over 5-6 small meals, increase water intake to 6-8 glasses, and 150 minutes per week of cardiovascular exercise.   - HgB A1c - Urinalysis Dipstick - metFORMIN (GLUCOPHAGE) 500 MG tablet; Take 1 tablet (500 mg total) by mouth daily with breakfast.  Dispense: 30 tablet; Refill: 2 - Basic Metabolic Panel  2. Hyperlipidemia LDL goal <70 - Lipid Panel  3. Seasonal allergic rhinitis due to pollen - cetirizine (ZYRTEC) 10 MG tablet; Take 1 tablet (10 mg total) by mouth daily.   Dispense: 30 tablet; Refill: 11 - fluticasone (FLONASE) 50 MCG/ACT nasal spray; Place 2 sprays into both nostrils daily.  Dispense: 16 g; Refill: 6  4. Language barrier to communication Patient primarily speaks [place], used video interpreter to assist with communication  RTC: 3 months for chronic conditions  [place]  MSN, FNP-C Patient [facility] [address] [place], [place] [address] [phone]

## 2017-09-04 NOTE — Patient Instructions (Addendum)
Will reduce Metformin to 500 mg daily with breakfast.  Will discontinue Lisinopril 2.5 mg daily Your A1C goal is less than 7. Your fasting blood sugar  Upon awakening goal is between 110-140.  Your LDL  (bad cholesterol goal is less than 100 Blood pressure goal is <140/90.  Recommend a lowfat, low carbohydrate diet divided over 5-6 small meals, increase water intake to 6-8 glasses, and 150 minutes per week of cardiovascular exercise.   Take your medications as prescribed Make sure that you are familiar with each one of your medications and what they are used to treat.  If you are unsure of medications, please bring to follow up Will send referral for eye exam  Please keep your scheduled follow up appointment.   Will start a trial of cetirizine 10 mg for allergic rhinitis Will also start fluticasone 2 sprays to each nostril daily It will reduce metformin to 500 mg a day with breakfast.  Lisinopril will be discontinued 2.5 mg daily Your A1C target is less than 7. Its fasting blood sugar to the goal awakening is between 110-140.  Your LDL (bad cholesterol target is less than 100 Blood pressure target is < 140/90.  Recommend a low fat and carbohydrate diet divided into 5-6 small meals, increase water intake to 6-8 vessels, and 150 minutes per week of cardiovascular exercise.   Take the medications as prescribed Make sure you are familiar with each of your medications and what you are used to treating.  If you are not sure of the medicines, please bring to follow up Will send reference for the view exam  Please keep your scheduled follow-up appointment.   A 10 mg Cetirizine test will be initiated for allergic rhinitis Fluticasone will also begin 2 sprays to each nostril daily

## 2017-09-04 NOTE — Progress Notes (Signed)
b

## 2017-09-05 LAB — BASIC METABOLIC PANEL
BUN / CREAT RATIO: 19 (ref 9–20)
BUN: 14 mg/dL (ref 6–24)
CALCIUM: 8.9 mg/dL (ref 8.7–10.2)
CO2: 23 mmol/L (ref 20–29)
CREATININE: 0.72 mg/dL — AB (ref 0.76–1.27)
Chloride: 108 mmol/L — ABNORMAL HIGH (ref 96–106)
GFR calc Af Amer: 135 mL/min/{1.73_m2} (ref >59)
GFR calc non Af Amer: 117 mL/min/{1.73_m2} (ref >59)
GLUCOSE: 96 mg/dL (ref 65–99)
Potassium: 3.8 mmol/L (ref 3.5–5.2)
Sodium: 144 mmol/L (ref 134–144)

## 2017-09-05 LAB — LIPID PANEL
CHOL/HDL RATIO: 2.4 ratio (ref 0.0–5.0)
Cholesterol, Total: 88 mg/dL — ABNORMAL LOW (ref 100–199)
HDL: 36 mg/dL — ABNORMAL LOW (ref >39)
LDL Calculated: 41 mg/dL (ref 0–99)
Triglycerides: 53 mg/dL (ref 0–149)
VLDL Cholesterol Cal: 11 mg/dL (ref 5–40)

## 2017-09-08 ENCOUNTER — Telehealth: Payer: Self-pay

## 2017-09-08 MED FILL — ATORVASTATIN 80 MG TABLET: 80 | 30 days supply | Qty: 30 | Fill #3

## 2017-09-08 NOTE — Telephone Encounter (Signed)
-----   Message from Dorena Dew, [place] sent at 09/05/2017  6:01 AM EDT ----- Regarding: lab results Please inform patient that total cholesterol has decreased to 88. Will discontinue high dose statin therapy. Repeat cholesterol level in 6 months. Recommend a lowfat, low carbohydrate diet divided over 5-6 small meals, increase water intake to 6-8 glasses, and 150 minutes per week of cardiovascular exercise.    [name]  MSN, FNP-C Patient [facility] [address] [place], [place] [address] [phone]

## 2017-09-08 NOTE — Telephone Encounter (Signed)
Left a vm for patient to callback 

## 2017-09-09 NOTE — Telephone Encounter (Signed)
-----   Message from Dorena Dew, [place] sent at 09/05/2017  6:01 AM EDT ----- Regarding: lab results Please inform patient that total cholesterol has decreased to 88. Will discontinue high dose statin therapy. Repeat cholesterol level in 6 months. Recommend a lowfat, low carbohydrate diet divided over 5-6 small meals, increase water intake to 6-8 glasses, and 150 minutes per week of cardiovascular exercise.    [name]  MSN, FNP-C Patient [facility] [address] [place], [place] [address] [phone]

## 2017-09-09 NOTE — Telephone Encounter (Signed)
Left a vm for patient to callback 

## 2017-09-10 NOTE — Telephone Encounter (Signed)
Letter will be mailed out to patient as I have been unable to reach by phone. 

## 2017-09-10 NOTE — Telephone Encounter (Signed)
-----   Message from Dorena Dew, [place] sent at 09/05/2017  6:01 AM EDT ----- Regarding: lab results Please inform patient that total cholesterol has decreased to 88. Will discontinue high dose statin therapy. Repeat cholesterol level in 6 months. Recommend a lowfat, low carbohydrate diet divided over 5-6 small meals, increase water intake to 6-8 glasses, and 150 minutes per week of cardiovascular exercise.    [name]  MSN, FNP-C Patient [facility] [address] [place], [place] [address] [phone]

## 2017-10-08 MED FILL — ?CETIRIZINE HCL 10 MG TABLE: 10 | 30 days supply | Qty: 30 | Fill #1

## 2017-10-08 MED FILL — FLUTICASONE PROP 50 MCG SPR: 50 | 30 days supply | Qty: 16 | Fill #1

## 2017-10-08 MED FILL — ?METFORMIN HCL 500MG TABLET: 500 | 30 days supply | Qty: 30 | Fill #1

## 2017-11-07 MED FILL — ?CETIRIZINE HCL 10 MG TABLE: 10 | 30 days supply | Qty: 30 | Fill #2

## 2017-11-07 MED FILL — FLUTICASONE PROP 50 MCG SPR: 50 | 30 days supply | Qty: 16 | Fill #2

## 2017-11-07 MED FILL — ATORVASTATIN 80 MG TABLET: 80 | 30 days supply | Qty: 30 | Fill #4

## 2017-11-07 MED FILL — ?METFORMIN HCL 500MG TABS: 500 | 30 days supply | Qty: 30 | Fill #2

## 2017-12-04 ENCOUNTER — Encounter: Payer: Self-pay | Admitting: Family Medicine

## 2017-12-04 ENCOUNTER — Ambulatory Visit (INDEPENDENT_AMBULATORY_CARE_PROVIDER_SITE_OTHER): Payer: Self-pay | Admitting: Family Medicine

## 2017-12-04 VITALS — BP 117/77 | HR 55 | Temp 97.9°F | Resp 14 | Ht 69.0 in | Wt 181.0 lb

## 2017-12-04 DIAGNOSIS — E119 Type 2 diabetes mellitus without complications: Secondary | ICD-10-CM

## 2017-12-04 DIAGNOSIS — E785 Hyperlipidemia, unspecified: Secondary | ICD-10-CM

## 2017-12-04 LAB — POCT GLYCOSYLATED HEMOGLOBIN (HGB A1C): Hemoglobin A1C: 5.5 % (ref 4.0–5.6)

## 2017-12-04 MED ORDER — ATORVASTATIN CALCIUM 80 MG PO TABS: 80.0000 mg | ORAL_TABLET | Freq: Every day | ORAL | 5 refills | Status: DC

## 2017-12-04 MED ORDER — TOPIRAMATE 50 MG PO TABS: 50.0000 mg | ORAL_TABLET | Freq: Every day | ORAL | 2 refills | Status: DC

## 2017-12-04 MED ORDER — MECLIZINE HCL 25 MG PO TABS: 25.0000 mg | ORAL_TABLET | Freq: Three times a day (TID) | ORAL | 0 refills | Status: DC | PRN

## 2017-12-04 MED ORDER — METFORMIN HCL 500 MG PO TABS: 500.0000 mg | ORAL_TABLET | Freq: Every day | ORAL | 5 refills | Status: DC

## 2017-12-04 MED FILL — TOPIRAMATE 50 MG TABLET: 50 | 30 days supply | Qty: 30 | Fill #0

## 2017-12-04 MED FILL — ?METFORMIN HCL 500MG TABS: 500 | 30 days supply | Qty: 30 | Fill #0

## 2017-12-04 MED FILL — ATORVASTATIN 80 MG TABLET: 80 | 30 days supply | Qty: 30 | Fill #0

## 2017-12-04 MED FILL — MECLIZINE 25 MG TABLET: 25 | 10 days supply | Qty: 30 | Fill #0

## 2017-12-04 NOTE — Patient Instructions (Signed)
Colesterol Cholesterol El colesterol es grasa. El organismo necesita una pequea cantidad de colesterol. El colesterol (placa) puede acumularse en los vasos sanguneos (arterias). Esto lo hace ms propenso a sufrir infartos de miocardio o accidentes cerebrovasculares. No puede sentir el nivel de colesterol. La nica forma de saber si el nivel es alto es mediante un anlisis de [place]. Guarde los [employer]. Trabaje con el mdico para Investment banker, corporate en un nivel adecuado. [place] significan los resultados?  El colesterol total es la cantidad de colesterol que hay en la [place].  El LDL es el colesterol [place]. Este tipo es el que puede acumularse. Intente mantener el nivel de LDL bajo.  El HDL es el colesterol bueno. Limpia los vasos sanguneos y elimina el colesterol LDL. Intente mantener el nivel de HDL alto.  Los triglicridos son grasas que el organismo puede Financial controller o quemar como fuente de Teacher, early years/pre. Cules son los niveles buenos de colesterol?  El colesterol total debe estar por debajo de 200.  Lo ms aconsejable para quienes tienen riesgos para la salud es mantener el nivel de LDL por debajo de 100. Lo ms aconsejable para quienes tienen muchos riesgos para la salud es mantener el nivel de LDL por debajo de 70.  Se aconseja mantener un nivel de HDL es por encima de 40. Pero lo mejor es tener un nivel de HDL de 60 o superior.  Los triglicridos por debajo de 150. Cmo puedo bajar el colesterol? Dieta Siga el programa de alimentacin que el mdico le indique.  Elija pescados o carne blanca de pollo y pavo asados u horneados. Intente no comer carne roja, alimentos fritos, salchichas ni embutidos.  Coma gran cantidad de frutas y verduras frescas.  Elija los cereales integrales, los frijoles, las pastas, las papas y los cereales.  Elija aceite de oliva, aceite de maz o aceite de canola. Solo use poca cantidad.  Intente no comer [place], [place], [place]  o aceites de [place].  Intente no comer alimentos que contengan grasas trans.  Elija alimentos lcteos descremados o sin grasa. ? Beba leche descremada o sin grasa. ? Coma yogur y quesos descremados o sin grasa. ? Intente no consumir leche entera o crema. ? Intente no comer helados, yemas de huevo ni quesos enteros.  Los postres sanos incluyen la torta ngel, los bocadillos de [place], las Administrator con forma de [place], los caramelos duros, los helados de agua y el yogur descremado o sin grasa. Intente no comer masas, tortas, pasteles y galletas.  La prctica de actividad fsica. Siga el programa de actividad fsica que le indique el mdico.  Sea ms [place]. Intente hacer los trabajos de [facility], salir a Writer y usar las escaleras.  Consulte al mdico cmo puede aumentar su actividad.  Medicamentos  [employer] de venta libre y los recetados solamente como se lo haya indicado el mdico.  Esta informacin no tiene Marine scientist el consejo del mdico. Asegrese de hacerle al mdico cualquier pregunta que tenga. Document Released: 06/01/2010 Document Revised: 07/29/2016 Document Reviewed: 11/09/2015 Elsevier Interactive Patient Education  2018 [place] en grasas y colesterol (Fat and Cholesterol Restricted Diet) El exceso de grasas y colesterol en la dieta puede causar problemas de [place]. Esta dieta lo ayudar a Theatre manager las grasas y Freight forwarder en los niveles normales para evitar enfermarse. QU TIPOS DE GRASAS DEBO ELEGIR?  Elija grasas monosaturadas y polinsaturadas. Estas se encuentran en alimentos como el aceite de [place], aceite de  canola, semillas de lino, nueces, almendras y semillas.  Consuma ms grasas omega-3. Las mejores opciones incluyen salmn, caballa, sardinas, atn, aceite de lino y semillas de lino molidas.  Limite el consumo de grasas saturadas, que se encuentran en productos de origen animal, como carnes,  mantequilla y crema. Tambin pueden estar en productos vegetales, como aceite de palma, de palmiste y de coco.  Evite los alimentos con aceites parcialmente hidrogenados. Estos contienen grasas trans. Entre los ejemplos de alimentos con grasas trans se incluyen margarinas en barra, algunas margarinas untables, galletas dulces o saladas y otros productos horneados. [place]?  Lea las etiquetas de los alimentos. Busque las palabras "grasas trans" y "grasas saturadas".  Al preparar una comida: ? Llene la mitad del plato con verduras y ensaladas de hojas verdes. ? Llene un cuarto del plato con cereales integrales. Busque la palabra "integral" en Equities trader de la lista de ingredientes. ? Llene un cuarto del plato con alimentos con protenas magras.  Ingiera alimentos con ms fibra, como [place], [place], frijoles, guisantes y [place].  Coma ms comidas caseras. Coma menos en los restaurantes y los bares.  Limite o evite el alcohol.  Limite los alimentos con alto contenido de almidn y Location manager.  Limite el consumo de alimentos fritos.  Cocine los alimentos sin frerlos. Las opciones de coccin ms [place] son Teacher, music, Adult nurse, Interior and spatial designer y asar a Administrator, arts.  Baje de peso si es necesario. Aunque pierda [employer], esto puede ser importante para la salud general. Tambin puede ayudar a prevenir enfermedades como diabetes y enfermedad cardaca. QU ALIMENTOS PUEDO COMER? Cereales Cereales integrales, como los panes de salvado o [place], las [place], los cereales y las pastas. Avena sin endulzar, trigo, [place], quinua o arroz integral. Tortillas de harina de maz o de salvado. Verduras Verduras frescas o congeladas (crudas, al vapor, asadas o grilladas). Ensaladas de hojas verdes. Lambert Mody Lambert Mody frescas, en conserva (en su jugo natural) o frutas congeladas. Carnes y otros productos con protenas Carne de res molida (al 85% o ms [place]), carne de res de  animales alimentados con pastos o carne de res sin la grasa. Pollo o pavo sin piel. Carne de pollo o de [place]. Cerdo sin la grasa. Todos los pescados y frutos de mar. Huevos. Porotos, guisantes o lentejas secos. Frutos secos o semillas sin sal. Frijoles secos o en lata sin sal. Lcteos Productos lcteos con bajo contenido de grasas, como [place] o al 1%, quesos reducidos en grasas o al 2%, ricota con bajo contenido de grasas o [employer], o yogur natural con bajo contenido de [place]. Grasas y [employer] untables que no contengan grasas trans. [place] y condimentos para ensaladas livianos o reducidos en grasas. [place]. Aceites de oliva, canola, ssamo o crtamo. Mantequilla natural de cacahuate o almendra (elija la que no tenga agregado de aceite o azcar). Los artculos mencionados arriba pueden no ser [employer] de las bebidas o los alimentos recomendados. Comunquese con el nutricionista para conocer ms opciones. QU ALIMENTOS NO SE RECOMIENDAN? Cereales Pan blanco. Pastas blancas. Arroz blanco. Pan de maz. Bagels, pasteles y croissants. Galletas saladas que contengan grasas trans. Verduras Papas blancas. [place] con crema o fritas. Verduras en [place]. Lambert Mody Frutas secas. Fruta enlatada en almbar liviano o espeso. Jugo de frutas. Carnes y otros productos con protenas [place] de carne con Lobbyist. Costillas, alas de pollo, tocineta, salchicha, mortadela, salame, chinchulines, tocino, perros calientes, salchichas alemanas y embutidos envasados. Hgado y  otros rganos. Lcteos Leche entera o al 2%, crema, mezcla de [place] y crema, y queso crema. Quesos enteros. Yogur entero o endulzado. Quesos con toda su grasa. [place] no lcteas y coberturas batidas. Quesos procesados, quesos para untar o cuajadas. Dulces y postres Jarabe de maz, azcares, miel y Control and instrumentation engineer. Caramelos. Mermelada y [place]. Chrissie Noa. Cereales endulzados. Galletas, pasteles,  bizcochuelos, donas, muffins y helado. Grasas y aceites [place], [place] en barra, [place] de [place], [place], [place] clarificada o grasa de tocino. Aceites de coco, de palmiste o de palma. Bebidas Alcohol. Bebidas endulzadas (como refrescos, limonadas y bebidas frutales o ponches). Los artculos mencionados arriba pueden no ser [employer] de las bebidas y los alimentos que se Higher education careers adviser. Comunquese con el nutricionista para obtener ms informacin. Esta informacin no tiene Marine scientist el consejo del mdico. Asegrese de hacerle al mdico cualquier pregunta que tenga. Document Released: 10/29/2011 Document Revised: 05/20/2014 Document Reviewed: 07/29/2013 Elsevier Interactive Patient Education  [employer].

## 2017-12-04 NOTE — Progress Notes (Signed)
Patient here for follow-up of hypertension.  He is not exercising and is adherent to a low-salt or carbohydrate modified eating lifestyle .  Blood pressure is well controlled at home.    is not compliant with ADA diet.  He is compliant with medications. He is not compliant with exercise. Exercise: The patient has a physically strenuous job, but has no regular exercise apart from work. .  Home blood sugar records: Does not check sugars at home.   Diabetic Labs Latest Ref Rng & Units 12/04/2017 09/04/2017 08/13/2017  HbA1c 4.0 - 5.6 % 5.5 5.5 -  Chol 100 - 199 mg/dL - 88(L) -  HDL >39 mg/dL - 36(L) -  Calc LDL 0 - 99 mg/dL - 41 -  Triglycerides 0 - 149 mg/dL - 53 -  Creatinine 0.76 - 1.27 mg/dL - 0.72(L) 0.69   Foot/eye exam completion dates 06/06/2017  Foot Form Completion Done    No results found for: POCGLU  Diabetes Health Maintenance Due  Topic Date Due  . OPHTHALMOLOGY EXAM  03/14/2018 (Originally 09[birth year]14/1986)  . HEMOGLOBIN A1C  03/06/2018  . FOOT EXAM  06/06/2018  . URINE MICROALBUMIN  06/06/2018   Pneumococcal Immunization Status  Topic Date Due  . PNEUMOCOCCAL POLYSACCHARIDE VACCINE (2) 06/06/2022   Past Medical History:  Diagnosis Date  . Stroke ([place])   . Type 2 diabetes mellitus ([place])     Social History   Socioeconomic History  . Marital status: Married    Spouse name: Not on file  . Number of children: Not on file  . Years of education: Not on file  . Highest education level: Not on file  Occupational History  . Not on file  Social Needs  . Financial resource strain: Not on file  . Food insecurity:    Worry: Not on file    Inability: Not on file  . Transportation needs:    Medical: Not on file    Non-medical: Not on file  Tobacco Use  . Smoking status: Former Research scientist (life sciences)  . Smokeless tobacco: Never Used  Substance and Sexual Activity  . Alcohol use: No    Frequency: Never  . Drug use: No  . Sexual activity: Not on file  Lifestyle  . Physical activity:     Days per week: Not on file    Minutes per session: Not on file  . Stress: Not on file  Relationships  . Social connections:    Talks on phone: Not on file    Gets together: Not on file    Attends religious service: Not on file    Active member of club or organization: Not on file    Attends meetings of clubs or organizations: Not on file    Relationship status: Not on file  . Intimate partner violence:    Fear of current or ex partner: Not on file    Emotionally abused: Not on file    Physically abused: Not on file    Forced sexual activity: Not on file  Other Topics Concern  . Not on file  Social History Narrative  . Not on file    No Known Allergies   Review of Systems  Constitutional: Negative.   HENT: Negative.   Eyes: Negative.   Respiratory: Negative.   Cardiovascular: Negative.   Gastrointestinal: Negative.   Genitourinary: Negative.   Musculoskeletal:       Intermittent foot pain with standing for long periods of time. bilateral. No numbness.   Skin: Negative.  Neurological: Negative.   Psychiatric/Behavioral: Negative.      Objective    BP 117/77 (BP Location: Right Arm, Patient Position: Sitting, Cuff Size: Normal)   Pulse (!) 55   Temp 97.9 F (36.6 C) (Oral)   Resp 14   Ht 5\' 9"  (1.753 m)   Wt 181 lb (82.1 kg)   SpO2 100%   BMI 26.73 kg/m    Physical Exam  Constitutional: He is oriented to person, place, and time and well-developed, well-nourished, and in no distress. No distress.  HENT:  Head: Normocephalic and atraumatic.  Eyes: Pupils are equal, round, and reactive to light. Conjunctivae and EOM are normal.  Neck: Normal range of motion.  Cardiovascular: Normal rate, regular rhythm, normal heart sounds and intact distal pulses.  No murmur heard. Pulmonary/Chest: Effort normal and breath sounds normal. No respiratory distress. He has no wheezes. He has no rales. He exhibits no tenderness.  Musculoskeletal: Normal range of motion.   Neurological: He is alert and oriented to person, place, and time. Gait normal.  Skin: Skin is warm and dry.  Psychiatric: Mood, memory, affect and judgment normal.  Nursing note and vitals reviewed.    Assessment   Shiloh was seen today for hyperlipidemia and diabetes.  Diagnoses and all orders for this visit:  Type 2 diabetes mellitus without complication, without long-term current use of insulin (HCC) -     HgB A1c -     Ambulatory referral to Ophthalmology -     metFORMIN (GLUCOPHAGE) 500 MG tablet; Take 1 tablet (500 mg total) by mouth daily with breakfast.  Hyperlipidemia LDL goal <100 -     atorvastatin (LIPITOR) 80 MG tablet; Take 1 tablet (80 mg total) by mouth daily at 6 PM. -     Comprehensive metabolic panel; Future -     Lipid panel; Future -     Lipid panel -     Comprehensive metabolic panel  Other orders -     topiramate (TOPAMAX) 50 MG tablet; Take 1 tablet (50 mg total) by mouth daily. -     meclizine (ANTIVERT) 25 MG tablet; Take 1 tablet (25 mg total) by mouth 3 (three) times daily as needed for dizziness.  The current medical regimen is effective;  continue present plan and medications.      We discussed using insoles in the shoes to help with support. Return to care as scheduled and prn. Patient verbalized understanding and agreed with plan of care.

## 2017-12-05 LAB — COMPREHENSIVE METABOLIC PANEL
ALT: 21 IU/L (ref 0–44)
AST: 29 IU/L (ref 0–40)
Albumin/Globulin Ratio: 2 (ref 1.2–2.2)
Albumin: 4.5 g/dL (ref 3.5–5.5)
Alkaline Phosphatase: 71 IU/L (ref 39–117)
BUN/Creatinine Ratio: 16 (ref 9–20)
BUN: 14 mg/dL (ref 6–24)
Bilirubin Total: 0.6 mg/dL (ref 0.0–1.2)
CO2: 23 mmol/L (ref 20–29)
Calcium: 9.1 mg/dL (ref 8.7–10.2)
Chloride: 106 mmol/L (ref 96–106)
Creatinine, Ser: 0.85 mg/dL (ref 0.76–1.27)
GFR calc Af Amer: 125 mL/min/{1.73_m2} (ref >59)
GFR calc non Af Amer: 108 mL/min/{1.73_m2} (ref >59)
Globulin, Total: 2.3 g/dL (ref 1.5–4.5)
Glucose: 83 mg/dL (ref 65–99)
Potassium: 4 mmol/L (ref 3.5–5.2)
Sodium: 144 mmol/L (ref 134–144)
Total Protein: 6.8 g/dL (ref 6.0–8.5)

## 2017-12-05 LAB — LIPID PANEL
Chol/HDL Ratio: 4.7 ratio (ref 0.0–5.0)
Cholesterol, Total: 194 mg/dL (ref 100–199)
HDL: 41 mg/dL (ref >39)
LDL Calculated: 120 mg/dL — ABNORMAL HIGH (ref 0–99)
Triglycerides: 164 mg/dL — ABNORMAL HIGH (ref 0–149)
VLDL Cholesterol Cal: 33 mg/dL (ref 5–40)

## 2017-12-10 MED FILL — ?CETIRIZINE HCL 10 MG TABLE: 10 | 30 days supply | Qty: 30 | Fill #3

## 2017-12-10 MED FILL — ATORVASTATIN 80 MG TABLET: 80 | 30 days supply | Qty: 30 | Fill #5

## 2017-12-10 MED FILL — FLUTICASONE PROP 50 MCG SPR: 50 | 30 days supply | Qty: 16 | Fill #3

## 2017-12-15 NOTE — Progress Notes (Signed)
Needs to follow a carb modified diet and increase exercise.   The  labs are stable without significant clinical change. Lipids are increasing.  All other results are normal or within acceptable limits. No Medication changes

## 2017-12-17 ENCOUNTER — Observation Stay (HOSPITAL_BASED_OUTPATIENT_CLINIC_OR_DEPARTMENT_OTHER)
Admission: EM | Admit: 2017-12-17 | Discharge: 2017-12-18 | Disposition: A | Discharge status: Home / Self Care | Payer: Self-pay | Attending: Family Medicine | Admitting: Family Medicine

## 2017-12-17 ENCOUNTER — Emergency Department (HOSPITAL_BASED_OUTPATIENT_CLINIC_OR_DEPARTMENT_OTHER): Payer: Self-pay

## 2017-12-17 ENCOUNTER — Other Ambulatory Visit: Payer: Self-pay

## 2017-12-17 ENCOUNTER — Encounter (HOSPITAL_BASED_OUTPATIENT_CLINIC_OR_DEPARTMENT_OTHER): Payer: Self-pay | Admitting: *Deleted

## 2017-12-17 DIAGNOSIS — E119 Type 2 diabetes mellitus without complications: Secondary | ICD-10-CM

## 2017-12-17 DIAGNOSIS — Z7982 Long term (current) use of aspirin: Secondary | ICD-10-CM | POA: Insufficient documentation

## 2017-12-17 DIAGNOSIS — R269 Unspecified abnormalities of gait and mobility: Secondary | ICD-10-CM | POA: Insufficient documentation

## 2017-12-17 DIAGNOSIS — R2990 Unspecified symptoms and signs involving the nervous system: Secondary | ICD-10-CM

## 2017-12-17 DIAGNOSIS — Z87891 Personal history of nicotine dependence: Secondary | ICD-10-CM | POA: Insufficient documentation

## 2017-12-17 DIAGNOSIS — E1151 Type 2 diabetes mellitus with diabetic peripheral angiopathy without gangrene: Secondary | ICD-10-CM | POA: Insufficient documentation

## 2017-12-17 DIAGNOSIS — G43109 Migraine with aura, not intractable, without status migrainosus: Principal | ICD-10-CM | POA: Insufficient documentation

## 2017-12-17 DIAGNOSIS — Z7984 Long term (current) use of oral hypoglycemic drugs: Secondary | ICD-10-CM | POA: Insufficient documentation

## 2017-12-17 DIAGNOSIS — R51 Headache: Secondary | ICD-10-CM

## 2017-12-17 DIAGNOSIS — Z79899 Other long term (current) drug therapy: Secondary | ICD-10-CM | POA: Insufficient documentation

## 2017-12-17 DIAGNOSIS — G510 Bell's palsy: Secondary | ICD-10-CM | POA: Insufficient documentation

## 2017-12-17 DIAGNOSIS — E785 Hyperlipidemia, unspecified: Secondary | ICD-10-CM | POA: Diagnosis present

## 2017-12-17 DIAGNOSIS — I10 Essential (primary) hypertension: Secondary | ICD-10-CM | POA: Insufficient documentation

## 2017-12-17 DIAGNOSIS — R519 Headache, unspecified: Secondary | ICD-10-CM | POA: Diagnosis present

## 2017-12-17 DIAGNOSIS — G459 Transient cerebral ischemic attack, unspecified: Secondary | ICD-10-CM | POA: Diagnosis present

## 2017-12-17 DIAGNOSIS — G44229 Chronic tension-type headache, not intractable: Secondary | ICD-10-CM

## 2017-12-17 DIAGNOSIS — Q211 Atrial septal defect: Secondary | ICD-10-CM | POA: Insufficient documentation

## 2017-12-17 DIAGNOSIS — Z8673 Personal history of transient ischemic attack (TIA), and cerebral infarction without residual deficits: Secondary | ICD-10-CM | POA: Insufficient documentation

## 2017-12-17 HISTORY — DX: Bell's palsy: G51.0

## 2017-12-17 LAB — CBC
HCT: 41.2 % (ref 39.0–52.0)
Hemoglobin: 15 g/dL (ref 13.0–17.0)
MCH: 32.8 pg (ref 26.0–34.0)
MCHC: 36.4 g/dL — ABNORMAL HIGH (ref 30.0–36.0)
MCV: 90.2 fL (ref 78.0–100.0)
PLATELETS: 236 10*3/uL (ref 150–400)
RBC: 4.57 MIL/uL (ref 4.22–5.81)
RDW: 12.9 % (ref 11.5–15.5)
WBC: 7.4 10*3/uL (ref 4.0–10.5)

## 2017-12-17 LAB — COMPREHENSIVE METABOLIC PANEL
ALK PHOS: 85 U/L (ref 38–126)
ALT: 25 U/L (ref 0–44)
ANION GAP: 11 (ref 5–15)
AST: 27 U/L (ref 15–41)
Albumin: 4.5 g/dL (ref 3.5–5.0)
BUN: 20 mg/dL (ref 6–20)
CALCIUM: 9.4 mg/dL (ref 8.9–10.3)
CO2: 25 mmol/L (ref 22–32)
Chloride: 104 mmol/L (ref 98–111)
Creatinine, Ser: 0.98 mg/dL (ref 0.61–1.24)
GFR calc non Af Amer: >60 mL/min (ref >60)
Glucose, Bld: 149 mg/dL — ABNORMAL HIGH (ref 70–99)
POTASSIUM: 3.5 mmol/L (ref 3.5–5.1)
SODIUM: 140 mmol/L (ref 135–145)
Total Bilirubin: 0.5 mg/dL (ref 0.3–1.2)
Total Protein: 7.4 g/dL (ref 6.5–8.1)

## 2017-12-17 LAB — PROTIME-INR
INR: 0.9
PROTHROMBIN TIME: 12.1 s (ref 11.4–15.2)

## 2017-12-17 LAB — DIFFERENTIAL
BASOS PCT: 1 %
Basophils Absolute: 0 10*3/uL (ref 0.0–0.1)
EOS PCT: 3 %
Eosinophils Absolute: 0.2 10*3/uL (ref 0.0–0.7)
Lymphocytes Relative: 44 %
Lymphs Abs: 3.2 10*3/uL (ref 0.7–4.0)
MONO ABS: 0.5 10*3/uL (ref 0.1–1.0)
Monocytes Relative: 7 %
NEUTROS PCT: 45 %
Neutro Abs: 3.4 10*3/uL (ref 1.7–7.7)

## 2017-12-17 LAB — URINALYSIS, ROUTINE W REFLEX MICROSCOPIC
Bilirubin Urine: NEGATIVE
GLUCOSE, UA: NEGATIVE mg/dL
Hgb urine dipstick: NEGATIVE
KETONES UR: NEGATIVE mg/dL
LEUKOCYTES UA: NEGATIVE
Nitrite: NEGATIVE
Protein, ur: NEGATIVE mg/dL
Specific Gravity, Urine: 1.01 (ref 1.005–1.030)
pH: 7.5 (ref 5.0–8.0)

## 2017-12-17 LAB — RAPID URINE DRUG SCREEN, HOSP PERFORMED
Amphetamines: NOT DETECTED
BENZODIAZEPINES: NOT DETECTED
Barbiturates: NOT DETECTED
Cocaine: NOT DETECTED
OPIATES: NOT DETECTED
TETRAHYDROCANNABINOL: NOT DETECTED

## 2017-12-17 LAB — ETHANOL

## 2017-12-17 LAB — APTT: aPTT: 27 seconds (ref 24–36)

## 2017-12-17 LAB — TROPONIN I: Troponin I: <0.03 ng/mL (ref <0.03)

## 2017-12-17 MED ORDER — KETOROLAC TROMETHAMINE 30 MG/ML IJ SOLN
30.0000 mg | Freq: Once | INTRAMUSCULAR | Status: AC
  Administered 2017-12-17: 30 mg via INTRAVENOUS
  Filled 2017-12-17: qty 1

## 2017-12-17 MED ORDER — DIPHENHYDRAMINE HCL 50 MG/ML IJ SOLN
50.0000 mg | Freq: Once | INTRAMUSCULAR | Status: AC
  Administered 2017-12-17: 50 mg via INTRAVENOUS
  Filled 2017-12-17: qty 1

## 2017-12-17 MED ORDER — IOPAMIDOL (ISOVUE-370) INJECTION 76%
100.0000 mL | Freq: Once | INTRAVENOUS | Status: AC | PRN
  Administered 2017-12-17: 100 mL via INTRAVENOUS

## 2017-12-17 MED ORDER — METOCLOPRAMIDE HCL 5 MG/ML IJ SOLN
10.0000 mg | Freq: Once | INTRAMUSCULAR | Status: AC
  Administered 2017-12-17: 10 mg via INTRAVENOUS
  Filled 2017-12-17: qty 2

## 2017-12-17 NOTE — ED Notes (Signed)
Report given to Bernadette Hoit at [facility] at [facility] ED

## 2017-12-17 NOTE — ED Provider Notes (Signed)
Emergency Department Provider Note   I have reviewed the triage vital signs and the nursing notes.   HISTORY  Chief Complaint Weakness   HPI Drew [name] is a 42 y.o. male with PMH of DM and stroke-like symptoms in December 2018 presents to the ED with sudden onset right sided weakness.  Patient and son at bedside state that symptoms began 1.5 hours prior to ED presentation.  He has had an associated headache for 3 days but no neurological symptoms.  He is been feeling somewhat lightheaded during that time but the right-sided weakness is new.  He also reports some numbness feeling in the face but states this is on the left side.  He states this feels similar to the time he was evaluated in the hospital in December.  Patient denies any heart palpitations or chest pain.  No speech or vision changes.  No fevers or chills.   Past Medical History:  Diagnosis Date  . Facial nerve palsy   . Stroke ([place])   . Type 2 diabetes mellitus [facility])     Patient Active Problem List   Diagnosis Date Noted  . Right-sided muscle weakness 05/08/2017  . Right sided numbness 05/08/2017  . Headache 05/08/2017  . Stroke-like symptoms 05/08/2017  . Non-English speaking patient 05/08/2017  . Weakness 05/07/2017    Past Surgical History:  Procedure Laterality Date  . APPENDECTOMY     Allergies Patient has no known allergies.  History reviewed. No pertinent family history.  Social History Social History   Tobacco Use  . Smoking status: Former Research scientist (life sciences)  . Smokeless tobacco: Never Used  Substance Use Topics  . Alcohol use: No    Frequency: Never  . Drug use: No    Review of Systems  Constitutional: No fever/chills Eyes: No visual changes. ENT: No sore throat. Cardiovascular: Denies chest pain. Respiratory: Denies shortness of breath. Gastrointestinal: No abdominal pain.  No nausea, no vomiting.  No diarrhea.  No constipation. Genitourinary: Negative for dysuria. Musculoskeletal: Negative  for back pain. Skin: Negative for rash. Neurological: Positive HA with left face numbness and RUE/RLE weakness.   10-point ROS otherwise negative.  ____________________________________________   PHYSICAL EXAM:  VITAL SIGNS: ED Triage Vitals  Enc Vitals Group     BP 12/17/17 2043 (!) 159/77     Pulse Rate 12/17/17 2045 66     Resp 12/17/17 2045 16     Temp 12/17/17 2045 98.2 F (36.8 C)     Temp src --      SpO2 12/17/17 2045 100 %     Weight 12/17/17 2044 181 lb (82.1 kg)     Height 12/17/17 2044 5\' 9"  (1.753 m)     Pain Score 12/17/17 2043 6   Constitutional: Alert and oriented. Well appearing and in no acute distress. Eyes: Conjunctivae are normal. PERRL. EOMI. Head: Atraumatic. Nose: No congestion/rhinnorhea. Mouth/Throat: Mucous membranes are moist. Neck: No stridor.   Cardiovascular: Normal rate, regular rhythm. Good peripheral circulation. Grossly normal heart sounds.   Respiratory: Normal respiratory effort.  No retractions. Lungs CTAB. Gastrointestinal: Soft and nontender. No distention.  Musculoskeletal: No lower extremity tenderness nor edema. No gross deformities of extremities. Neurologic:  Normal speech and language. No gross focal neurologic deficits are appreciated. Subjective numbness to the left face. No asymmetry. Otherwise normal CN exam. 4+/5 strength in the upper and lower extremities. No sensory deficits in the extremities.  Skin:  Skin is warm, dry and intact. No rash noted.  ____________________________________________  LABS (all labs ordered are listed, but only abnormal results are displayed)  Labs Reviewed  CBC - Abnormal; Notable for the following components:      Result Value   MCHC 36.4 (*)    All other components within normal limits  COMPREHENSIVE METABOLIC PANEL - Abnormal; Notable for the following components:   Glucose, Bld 149 (*)    All other components within normal limits  ETHANOL  PROTIME-INR  APTT  DIFFERENTIAL  TROPONIN  I  RAPID URINE DRUG SCREEN, HOSP PERFORMED  URINALYSIS, ROUTINE W REFLEX MICROSCOPIC   ____________________________________________  EKG   EKG Interpretation  Date/Time:  Wednesday December 17 2017 21:04:28 EDT Ventricular Rate:  57 PR Interval:    QRS Duration: 102 QT Interval:  409 QTC Calculation: 399 R Axis:   -72 Text Interpretation:  Sinus rhythm Left axis deviation ST elev, probable normal early repol pattern No STEMI.  Confirmed by Nanda Quinton [phone]) on 12/17/2017 9:36:29 PM       ____________________________________________  RADIOLOGY  Ct Angio Head W Or Wo Contrast  Result Date: 12/17/2017 CLINICAL DATA:  Thunderclap headache with right-sided weakness. EXAM: CT ANGIOGRAPHY HEAD AND NECK TECHNIQUE: Multidetector CT imaging of the head and neck was performed using the standard protocol during bolus administration of intravenous contrast. Multiplanar CT image reconstructions and MIPs were obtained to evaluate the vascular anatomy. Carotid stenosis measurements (when applicable) are obtained utilizing NASCET criteria, using the distal internal carotid diameter as the denominator. CONTRAST:  125mL ISOVUE-370 IOPAMIDOL (ISOVUE-370) INJECTION 76% COMPARISON:  Head CT 12/17/2017 CTA head neck 05/08/2017 FINDINGS: CTA NECK FINDINGS AORTIC ARCH: There is no calcific atherosclerosis of the aortic arch. There is no aneurysm, dissection or hemodynamically significant stenosis of the visualized ascending aorta and aortic arch. Conventional 3 vessel aortic branching pattern. The visualized proximal subclavian arteries are widely patent. RIGHT CAROTID SYSTEM: --Common carotid artery: Widely patent origin without common carotid artery dissection or aneurysm. --Internal carotid artery: No dissection, occlusion or aneurysm. No hemodynamically significant stenosis. --External carotid artery: No acute abnormality. LEFT CAROTID SYSTEM: --Common carotid artery: Widely patent origin without common carotid  artery dissection or aneurysm. --Internal carotid artery:No dissection, occlusion or aneurysm. No hemodynamically significant stenosis. --External carotid artery: No acute abnormality. VERTEBRAL ARTERIES: Codominant configuration. Both origins are normal. No dissection, occlusion or flow-limiting stenosis to the vertebrobasilar confluence. SKELETON: There is no bony spinal canal stenosis. No lytic or blastic lesion. OTHER NECK: Normal pharynx, larynx and major salivary glands. No cervical lymphadenopathy. Unremarkable thyroid gland. UPPER CHEST: No pneumothorax or pleural effusion. No nodules or masses. CTA HEAD FINDINGS ANTERIOR CIRCULATION: --Intracranial internal carotid arteries: Normal. --Anterior cerebral arteries: Normal. Both A1 segments are present. Patent anterior communicating artery. --Middle cerebral arteries: Normal. --Posterior communicating arteries: Absent bilaterally. POSTERIOR CIRCULATION: --Basilar artery: Normal. --Posterior cerebral arteries: Normal. --Superior cerebellar arteries: Normal. --Inferior cerebellar arteries: Normal anterior and posterior inferior cerebellar arteries. VENOUS SINUSES: Normal variant diminutive left transverse sinus. ANATOMIC VARIANTS: None DELAYED PHASE: No parenchymal contrast enhancement. Review of the MIP images confirms the above findings. IMPRESSION: Normal CTA of the head and neck. Electronically Signed   By: Ulyses Jarred M.D.   On: 12/17/2017 21:50   Ct Head Wo Contrast  Result Date: 12/17/2017 CLINICAL DATA:  RIGHT-sided weakness for 1 hour. Gait instability. History of diabetes and stroke. EXAM: CT HEAD WITHOUT CONTRAST TECHNIQUE: Contiguous axial images were obtained from the base of the skull through the vertex without intravenous contrast. COMPARISON:  MRI head May 08, 2017 FINDINGS:  BRAIN: No intraparenchymal hemorrhage, mass effect nor midline shift. The ventricles and sulci are normal. No acute large vascular territory infarcts. No abnormal  extra-axial fluid collections. Basal cisterns are patent. Mild cerebellar tonsillar ectopia VASCULAR: Unremarkable. SKULL/SOFT TISSUES: No skull fracture. No significant soft tissue swelling. ORBITS/SINUSES: The included ocular globes and orbital contents are normal.Trace paranasal sinus mucosal thickening. Mastoid air cells are well aerated. OTHER: None. ASPECTS [facility] Stroke Program Early CT Score) - Ganglionic level infarction (caudate, lentiform nuclei, internal capsule, insula, M1-M3 cortex): 7 - Supraganglionic infarction (M4-M6 cortex): 3 Total score (0-10 with 10 being normal): 10 IMPRESSION: 1. No acute intracranial process. 2. Stable mild cerebellar tonsillar ectopia, incompletely imaged. 3. ASPECTS is 10. 4. Critical Value/emergent results were called by telephone at the time of interpretation on 12/17/2017 at 9:01 pm to Dr. Nanda Quinton , who verbally acknowledged these results. Electronically Signed   By: Elon Alas M.D.   On: 12/17/2017 21:02   Ct Angio Neck W And/or Wo Contrast  Result Date: 12/17/2017 CLINICAL DATA:  Thunderclap headache with right-sided weakness. EXAM: CT ANGIOGRAPHY HEAD AND NECK TECHNIQUE: Multidetector CT imaging of the head and neck was performed using the standard protocol during bolus administration of intravenous contrast. Multiplanar CT image reconstructions and MIPs were obtained to evaluate the vascular anatomy. Carotid stenosis measurements (when applicable) are obtained utilizing NASCET criteria, using the distal internal carotid diameter as the denominator. CONTRAST:  173mL ISOVUE-370 IOPAMIDOL (ISOVUE-370) INJECTION 76% COMPARISON:  Head CT 12/17/2017 CTA head neck 05/08/2017 FINDINGS: CTA NECK FINDINGS AORTIC ARCH: There is no calcific atherosclerosis of the aortic arch. There is no aneurysm, dissection or hemodynamically significant stenosis of the visualized ascending aorta and aortic arch. Conventional 3 vessel aortic branching pattern. The visualized  proximal subclavian arteries are widely patent. RIGHT CAROTID SYSTEM: --Common carotid artery: Widely patent origin without common carotid artery dissection or aneurysm. --Internal carotid artery: No dissection, occlusion or aneurysm. No hemodynamically significant stenosis. --External carotid artery: No acute abnormality. LEFT CAROTID SYSTEM: --Common carotid artery: Widely patent origin without common carotid artery dissection or aneurysm. --Internal carotid artery:No dissection, occlusion or aneurysm. No hemodynamically significant stenosis. --External carotid artery: No acute abnormality. VERTEBRAL ARTERIES: Codominant configuration. Both origins are normal. No dissection, occlusion or flow-limiting stenosis to the vertebrobasilar confluence. SKELETON: There is no bony spinal canal stenosis. No lytic or blastic lesion. OTHER NECK: Normal pharynx, larynx and major salivary glands. No cervical lymphadenopathy. Unremarkable thyroid gland. UPPER CHEST: No pneumothorax or pleural effusion. No nodules or masses. CTA HEAD FINDINGS ANTERIOR CIRCULATION: --Intracranial internal carotid arteries: Normal. --Anterior cerebral arteries: Normal. Both A1 segments are present. Patent anterior communicating artery. --Middle cerebral arteries: Normal. --Posterior communicating arteries: Absent bilaterally. POSTERIOR CIRCULATION: --Basilar artery: Normal. --Posterior cerebral arteries: Normal. --Superior cerebellar arteries: Normal. --Inferior cerebellar arteries: Normal anterior and posterior inferior cerebellar arteries. VENOUS SINUSES: Normal variant diminutive left transverse sinus. ANATOMIC VARIANTS: None DELAYED PHASE: No parenchymal contrast enhancement. Review of the MIP images confirms the above findings. IMPRESSION: Normal CTA of the head and neck. Electronically Signed   By: Ulyses Jarred M.D.   On: 12/17/2017 21:50    ____________________________________________   PROCEDURES  Procedure(s) performed:    Procedures  None ____________________________________________   INITIAL IMPRESSION / ASSESSMENT AND PLAN / ED COURSE  Pertinent labs & imaging results that were available during my care of the patient were reviewed by me and considered in my medical decision making (see chart for details).  Patient presents to the emergency department for  evaluation of acute onset right-sided weakness.  He is 4+ out of 5 strength in the upper and lower extremities with some right leg drift.  He reports some subjective numbness to the left face.  Patient also with headache but this is been going on for 3 days.  His admission from December showed no evidence of stroke at that time.  Given his acute onset symptoms, however, plan for tele-neurology evaluation as a code stroke.  They evaluated the patient and advised no TPA.  They do recommend CT angio and MRI.  They recommend admission for stroke evaluation.   11:15 PM She is headache is mostly gone.  His neurological symptoms seem to be improving as well but he still reports some mild right side subjective weakness.  On my reexamination he seems to have full strength.  I spoke with neurology, Dr. Londell Moh.  Plan for ED to ED transfer for MRI and he can consult in the ED. CT angio of the head/neck with no acute findings. Labs reviewed without acute findings. Spoke with Dr. Betsey Holiday who accepts the patient in transfer to the ED. Family ok to drive the patient to the ED. Will provide directions.  ____________________________________________  FINAL CLINICAL IMPRESSION(S) / ED DIAGNOSES  Final diagnoses:  Stroke-like symptoms  Acute nonintractable headache, unspecified headache type     MEDICATIONS GIVEN DURING THIS VISIT:  Medications  ketorolac (TORADOL) 30 MG/ML injection 30 mg (30 mg Intravenous Given 12/17/17 2204)  metoCLOPramide (REGLAN) injection 10 mg (10 mg Intravenous Given 12/17/17 2158)  diphenhydrAMINE (BENADRYL) injection 50 mg (50 mg Intravenous  Given 12/17/17 2155)  iopamidol (ISOVUE-370) 76 % injection 100 mL (100 mLs Intravenous Contrast Given 12/17/17 2126)    Note:  This document was prepared using Dragon voice recognition software and may include unintentional dictation errors.  Nanda Quinton, MD Emergency Medicine   Lynsey Ange, [facility], MD 12/17/17 [phone]

## 2017-12-17 NOTE — ED Triage Notes (Signed)
Pt c/o right weakness and facial numbness x 1 hr. C/o h/a x 3 days ago

## 2017-12-17 NOTE — ED Notes (Signed)
Patient transported to CT 

## 2017-12-18 ENCOUNTER — Encounter (HOSPITAL_COMMUNITY): Payer: Self-pay | Admitting: Internal Medicine

## 2017-12-18 ENCOUNTER — Emergency Department (HOSPITAL_COMMUNITY): Payer: Self-pay

## 2017-12-18 ENCOUNTER — Observation Stay (HOSPITAL_BASED_OUTPATIENT_CLINIC_OR_DEPARTMENT_OTHER): Payer: Self-pay

## 2017-12-18 ENCOUNTER — Observation Stay (HOSPITAL_COMMUNITY): Payer: Self-pay

## 2017-12-18 DIAGNOSIS — G43109 Migraine with aura, not intractable, without status migrainosus: Secondary | ICD-10-CM

## 2017-12-18 DIAGNOSIS — G459 Transient cerebral ischemic attack, unspecified: Secondary | ICD-10-CM

## 2017-12-18 DIAGNOSIS — R2990 Unspecified symptoms and signs involving the nervous system: Secondary | ICD-10-CM

## 2017-12-18 DIAGNOSIS — E119 Type 2 diabetes mellitus without complications: Secondary | ICD-10-CM

## 2017-12-18 DIAGNOSIS — R51 Headache: Secondary | ICD-10-CM

## 2017-12-18 DIAGNOSIS — E785 Hyperlipidemia, unspecified: Secondary | ICD-10-CM

## 2017-12-18 LAB — ECHOCARDIOGRAM COMPLETE
HEIGHTINCHES: 69 in
Weight: 2896 oz

## 2017-12-18 LAB — LIPID PANEL
CHOLESTEROL: 233 mg/dL — AB (ref 0–200)
HDL: 38 mg/dL — ABNORMAL LOW (ref >40)
LDL Cholesterol: 148 mg/dL — ABNORMAL HIGH (ref 0–99)
TRIGLYCERIDES: 237 mg/dL — AB (ref <150)
Total CHOL/HDL Ratio: 6.1 RATIO
VLDL: 47 mg/dL — ABNORMAL HIGH (ref 0–40)

## 2017-12-18 LAB — CBG MONITORING, ED
GLUCOSE-CAPILLARY: 112 mg/dL — AB (ref 70–99)
GLUCOSE-CAPILLARY: 123 mg/dL — AB (ref 70–99)

## 2017-12-18 MED ORDER — CLOPIDOGREL BISULFATE 300 MG PO TABS
300.0000 mg | ORAL_TABLET | Freq: Once | ORAL | Status: AC
  Administered 2017-12-18: 300 mg via ORAL
  Filled 2017-12-18: qty 1

## 2017-12-18 MED ORDER — ATORVASTATIN CALCIUM 20 MG PO TABS: 80.0000 mg | ORAL_TABLET | Freq: Every day | ORAL | Status: DC

## 2017-12-18 MED ORDER — CLOPIDOGREL BISULFATE 75 MG PO TABS: 75.0000 mg | ORAL_TABLET | Freq: Every day | ORAL | Status: DC

## 2017-12-18 MED ORDER — STROKE: EARLY STAGES OF RECOVERY BOOK
Freq: Once | Status: DC
  Filled 2017-12-18: qty 1

## 2017-12-18 MED ORDER — ZOLPIDEM TARTRATE 5 MG PO TABS: 5.0000 mg | ORAL_TABLET | Freq: Every evening | ORAL | Status: DC | PRN

## 2017-12-18 MED ORDER — METFORMIN HCL 500 MG PO TABS: 500.0000 mg | ORAL_TABLET | Freq: Every day | ORAL | 5 refills | Status: DC

## 2017-12-18 MED ORDER — ASPIRIN 325 MG PO TABS
325.0000 mg | ORAL_TABLET | Freq: Once | ORAL | Status: AC
  Administered 2017-12-18: 325 mg via ORAL
  Filled 2017-12-18: qty 1

## 2017-12-18 MED ORDER — INSULIN ASPART 100 UNIT/ML ~~LOC~~ SOLN
0.0000 [IU] | Freq: Three times a day (TID) | SUBCUTANEOUS | Status: DC
  Administered 2017-12-18: 1 [IU] via SUBCUTANEOUS
  Filled 2017-12-18: qty 1

## 2017-12-18 MED ORDER — INSULIN ASPART 100 UNIT/ML ~~LOC~~ SOLN: 0.0000 [IU] | Freq: Every day | SUBCUTANEOUS | Status: DC

## 2017-12-18 MED ORDER — LORATADINE 10 MG PO TABS
10.0000 mg | ORAL_TABLET | Freq: Every day | ORAL | Status: DC
  Administered 2017-12-18: 10 mg via ORAL
  Filled 2017-12-18: qty 1

## 2017-12-18 MED ORDER — NORTRIPTYLINE HCL 25 MG PO CAPS: 25.0000 mg | ORAL_CAPSULE | Freq: Every day | ORAL | 5 refills | Status: DC

## 2017-12-18 MED ORDER — MECLIZINE HCL 25 MG PO TABS
25.0000 mg | ORAL_TABLET | Freq: Three times a day (TID) | ORAL | Status: DC | PRN
Start: 2017-12-18 — End: 2017-12-18

## 2017-12-18 MED ORDER — TOPIRAMATE 25 MG PO TABS: 50.0000 mg | ORAL_TABLET | Freq: Every day | ORAL | Status: DC

## 2017-12-18 MED ORDER — FLUTICASONE PROPIONATE 50 MCG/ACT NA SUSP
2.0000 | Freq: Every day | NASAL | Status: DC
  Filled 2017-12-18: qty 16

## 2017-12-18 MED ORDER — ONDANSETRON HCL 4 MG/2ML IJ SOLN: 4.0000 mg | Freq: Three times a day (TID) | INTRAMUSCULAR | Status: DC | PRN

## 2017-12-18 MED ORDER — ASPIRIN EC 325 MG PO TBEC: 325.0000 mg | DELAYED_RELEASE_TABLET | Freq: Every day | ORAL | Status: DC

## 2017-12-18 MED ORDER — ACETAMINOPHEN 325 MG PO TABS: 650.0000 mg | ORAL_TABLET | Freq: Four times a day (QID) | ORAL | Status: DC | PRN

## 2017-12-18 MED ORDER — TOPIRAMATE 25 MG PO TABS
50.0000 mg | ORAL_TABLET | Freq: Two times a day (BID) | ORAL | Status: DC
  Administered 2017-12-18: 50 mg via ORAL
  Filled 2017-12-18: qty 2

## 2017-12-18 MED ORDER — TOPIRAMATE 50 MG PO TABS: 50.0000 mg | ORAL_TABLET | Freq: Two times a day (BID) | ORAL | 2 refills | Status: DC

## 2017-12-18 MED ORDER — SENNOSIDES-DOCUSATE SODIUM 8.6-50 MG PO TABS: 2.0000 | ORAL_TABLET | Freq: Every evening | ORAL | 1 refills | Status: DC | PRN

## 2017-12-18 MED ORDER — ENOXAPARIN SODIUM 40 MG/0.4ML ~~LOC~~ SOLN
40.0000 mg | Freq: Every day | SUBCUTANEOUS | Status: DC
  Filled 2017-12-18 (×2): qty 0.4

## 2017-12-18 MED ORDER — ASPIRIN EC 81 MG PO TBEC: 81.0000 mg | DELAYED_RELEASE_TABLET | Freq: Every day | ORAL | Status: DC

## 2017-12-18 MED ORDER — SENNOSIDES-DOCUSATE SODIUM 8.6-50 MG PO TABS: 1.0000 | ORAL_TABLET | Freq: Every evening | ORAL | Status: DC | PRN

## 2017-12-18 MED FILL — NORTRIPTYLINE HCL 25 MG CAP: 25 | 30 days supply | Qty: 30 | Fill #0

## 2017-12-18 NOTE — ED Provider Notes (Signed)
Patient transferred from [facility] for MRI.  Patient was seen for sudden onset right-sided weakness.  He was evaluated by tele-neurology and there was concern for stroke versus TIA.  Patient transferred here to facilitate MRI.  MRI has been performed, no evidence of stroke.  Patient has been seen by Dr. Lorraine Lax, on-call for neurology.  Recommends admission for TIA work-up.  Will administer Plavix and aspirin.   Orpah Greek, MD 12/18/17 [phone]

## 2017-12-18 NOTE — H&P (Signed)
History and Physical    Drew [name] SWF:[id number] DOB: 24-Mar-1976 DOA: 12/17/2017  Referring MD/NP/PA:   PCP: Lanae Boast, [place]   Patient coming from:  The patient is coming from home.  At baseline, pt is independent for most of ADL.   Chief Complaint: left-sided weakness, headache  HPI: Drew [name] is a 42 y.o. male with medical history significant of hyperlipidemia, diabetes mellitus, stroke like symptoms in the past, who presents with right-sided weakness and headache.  Pt started having right-sided weakness and numbness, slurred speech and R facial numbness at about 3 PM.  No vision change or hearing loss.  Patient states that he has been having occipital headache in the past 3 days.  His symptoms have largely resolved except for mild headache.  Patient denies any chest pain, shortness of breath, cough.  No fever or chills.  No nausea, vomiting, diarrhea or abdominal pain.  No symptoms of UTI.  ED Course: pt was found to have WBC 7.4, negative troponin, negative UDS, negative urinalysis, electrolytes renal function okay, bradycardia, no tachypnea, oxygen saturation 97% on room air, temperature normal.  MRI of the brain is negative.  CT angiogram of neck and head is negative.  CT head showed no acute intracranial process, but showed stable mild cerebellar tonsillar ectopia which incompletely imaged. Pt is placed on telemetry bed for observation.  Neurology, Dr. Lorraine Lax was consulted.  Review of Systems:   General: no fevers, chills, no body weight gain, has fatigue HEENT: no blurry vision, hearing changes or sore throat Respiratory: no dyspnea, coughing, wheezing CV: no chest pain, no palpitations GI: no nausea, vomiting, abdominal pain, diarrhea, constipation GU: no dysuria, burning on urination, increased urinary frequency, hematuria  Ext: no leg edema Neuro: Right-sided weakness and numbness, R facial numbness and headache.  No vision change or hearing loss Skin: no rash, no skin  tear. MSK: No muscle spasm, no deformity, no limitation of range of movement in spin Heme: No easy bruising.  Travel history: No recent long distant travel.  Allergy: No Known Allergies  Past Medical History:  Diagnosis Date  . Facial nerve palsy   . Stroke ([place])   . Type 2 diabetes mellitus ([place])     Past Surgical History:  Procedure Laterality Date  . APPENDECTOMY      Social History:  reports that he has quit smoking. He has never used smokeless tobacco. He reports that he does not drink alcohol or use drugs.  Family History:  Family History  Problem Relation Age of Onset  . Stroke Father      Prior to Admission medications   Medication Sig Start Date End Date Taking? Authorizing Provider  acetaminophen (TYLENOL) 325 MG tablet Take 650 mg by mouth every 6 (six) hours as needed for mild pain or headache.   Yes [provider]  aspirin 81 MG EC tablet Take 1 tablet (81 mg total) by mouth daily. 06/06/17  Yes Dorena Dew, FNP  atorvastatin (LIPITOR) 80 MG tablet Take 1 tablet (80 mg total) by mouth daily at 6 PM. 12/04/17  Yes Lanae Boast, FNP  cetirizine (ZYRTEC) 10 MG tablet Take 1 tablet (10 mg total) by mouth daily. 09/04/17  Yes Dorena Dew, FNP  fluticasone (FLONASE) 50 MCG/ACT nasal spray Place 2 sprays into both nostrils daily. 09/04/17  Yes Dorena Dew, FNP  meclizine (ANTIVERT) 25 MG tablet Take 1 tablet (25 mg total) by mouth 3 (three) times daily as needed for dizziness. 12/04/17  Yes  Lanae Boast, FNP  metFORMIN (GLUCOPHAGE) 500 MG tablet Take 1 tablet (500 mg total) by mouth daily with breakfast. 12/04/17 01/03/18 Yes Lanae Boast, FNP  topiramate (TOPAMAX) 50 MG tablet Take 1 tablet (50 mg total) by mouth daily. 12/04/17  Yes Lanae Boast, FNP    Physical Exam: Vitals:   12/17/17 2200 12/17/17 2215 12/17/17 2310 12/18/17 0042  BP: (!) 142/93 132/81 104/67 106/67  Pulse: 66 (!) 59 (!) 45 (!) 45  Resp: 18 15 16 16   Temp:       SpO2: 99% 98% 97% 100%  Weight:      Height:       General: Not in acute distress HEENT:       Eyes: PERRL, EOMI, no scleral icterus.       ENT: No discharge from the ears and nose, no pharynx injection, no tonsillar enlargement.        Neck: No JVD, no bruit, no mass felt. Heme: No neck lymph node enlargement. Cardiac: S1/S2, RRR, No murmurs, No gallops or rubs. Respiratory: No rales, wheezing, rhonchi or rubs. GI: Soft, nondistended, nontender, no rebound pain, no organomegaly, BS present. GU: No hematuria Ext: No pitting leg edema bilaterally. 2+DP/PT pulse bilaterally. Musculoskeletal: No joint deformities, No joint redness or warmth, no limitation of ROM in spin. Skin: No rashes.  Neuro: Alert, oriented X3, cranial nerves II-XII grossly intact, moves all extremities normally. Muscle strength 5/5 in all extremities, sensation to light touch intact. Brachial reflex 2+ bilaterally. Negative Babinski's sign. Normal finger to nose test. Psych: Patient is not psychotic, no suicidal or hemocidal ideation.  Labs on Admission: I have personally reviewed following labs and imaging studies  CBC: Recent Labs  Lab 12/17/17 2058  WBC 7.4  NEUTROABS 3.4  HGB 15.0  HCT 41.2  MCV 90.2  PLT 759   Basic Metabolic Panel: Recent Labs  Lab 12/17/17 2058  NA 140  K 3.5  CL 104  CO2 25  GLUCOSE 149*  BUN 20  CREATININE 0.98  CALCIUM 9.4   GFR: Estimated Creatinine Clearance: 99.2 mL/min (by C-G formula based on SCr of 0.98 mg/dL). Liver Function Tests: Recent Labs  Lab 12/17/17 2058  AST 27  ALT 25  ALKPHOS 85  BILITOT 0.5  PROT 7.4  ALBUMIN 4.5   No results for input(s): LIPASE, AMYLASE in the last 168 hours. No results for input(s): AMMONIA in the last 168 hours. Coagulation Profile: Recent Labs  Lab 12/17/17 2058  INR 0.90   Cardiac Enzymes: Recent Labs  Lab 12/17/17 2058  TROPONINI <0.03   BNP (last 3 results) No results for input(s): PROBNP in the last  8760 hours. HbA1C: No results for input(s): HGBA1C in the last 72 hours. CBG: No results for input(s): GLUCAP in the last 168 hours. Lipid Profile: No results for input(s): CHOL, HDL, LDLCALC, TRIG, CHOLHDL, LDLDIRECT in the last 72 hours. Thyroid Function Tests: No results for input(s): TSH, T4TOTAL, FREET4, T3FREE, THYROIDAB in the last 72 hours. Anemia Panel: No results for input(s): VITAMINB12, FOLATE, FERRITIN, TIBC, IRON, RETICCTPCT in the last 72 hours. Urine analysis:    Component Value Date/Time   COLORURINE YELLOW 12/17/2017 2149   APPEARANCEUR CLEAR 12/17/2017 2149   LABSPEC 1.010 12/17/2017 2149   PHURINE 7.5 12/17/2017 2149   GLUCOSEU NEGATIVE 12/17/2017 2149   HGBUR NEGATIVE 12/17/2017 2149   BILIRUBINUR NEGATIVE 12/17/2017 2149   BILIRUBINUR neg 09/04/2017 [place] 12/17/2017 2149   PROTEINUR NEGATIVE 12/17/2017 2149  UROBILINOGEN 0.2 09/04/2017 1359   UROBILINOGEN 0.2 06/06/2017 1428   NITRITE NEGATIVE 12/17/2017 2149   LEUKOCYTESUR NEGATIVE 12/17/2017 2149   Sepsis Labs: @LABRCNTIP (procalcitonin:4,lacticidven:4) )No results found for this or any previous visit (from the past 240 hour(s)).   Radiological Exams on Admission: Ct Angio Head W Or Wo Contrast  Result Date: 12/17/2017 CLINICAL DATA:  Thunderclap headache with right-sided weakness. EXAM: CT ANGIOGRAPHY HEAD AND NECK TECHNIQUE: Multidetector CT imaging of the head and neck was performed using the standard protocol during bolus administration of intravenous contrast. Multiplanar CT image reconstructions and MIPs were obtained to evaluate the vascular anatomy. Carotid stenosis measurements (when applicable) are obtained utilizing NASCET criteria, using the distal internal carotid diameter as the denominator. CONTRAST:  152mL ISOVUE-370 IOPAMIDOL (ISOVUE-370) INJECTION 76% COMPARISON:  Head CT 12/17/2017 CTA head neck 05/08/2017 FINDINGS: CTA NECK FINDINGS AORTIC ARCH: There is no calcific  atherosclerosis of the aortic arch. There is no aneurysm, dissection or hemodynamically significant stenosis of the visualized ascending aorta and aortic arch. Conventional 3 vessel aortic branching pattern. The visualized proximal subclavian arteries are widely patent. RIGHT CAROTID SYSTEM: --Common carotid artery: Widely patent origin without common carotid artery dissection or aneurysm. --Internal carotid artery: No dissection, occlusion or aneurysm. No hemodynamically significant stenosis. --External carotid artery: No acute abnormality. LEFT CAROTID SYSTEM: --Common carotid artery: Widely patent origin without common carotid artery dissection or aneurysm. --Internal carotid artery:No dissection, occlusion or aneurysm. No hemodynamically significant stenosis. --External carotid artery: No acute abnormality. VERTEBRAL ARTERIES: Codominant configuration. Both origins are normal. No dissection, occlusion or flow-limiting stenosis to the vertebrobasilar confluence. SKELETON: There is no bony spinal canal stenosis. No lytic or blastic lesion. OTHER NECK: Normal pharynx, larynx and major salivary glands. No cervical lymphadenopathy. Unremarkable thyroid gland. UPPER CHEST: No pneumothorax or pleural effusion. No nodules or masses. CTA HEAD FINDINGS ANTERIOR CIRCULATION: --Intracranial internal carotid arteries: Normal. --Anterior cerebral arteries: Normal. Both A1 segments are present. Patent anterior communicating artery. --Middle cerebral arteries: Normal. --Posterior communicating arteries: Absent bilaterally. POSTERIOR CIRCULATION: --Basilar artery: Normal. --Posterior cerebral arteries: Normal. --Superior cerebellar arteries: Normal. --Inferior cerebellar arteries: Normal anterior and posterior inferior cerebellar arteries. VENOUS SINUSES: Normal variant diminutive left transverse sinus. ANATOMIC VARIANTS: None DELAYED PHASE: No parenchymal contrast enhancement. Review of the MIP images confirms the above  findings. IMPRESSION: Normal CTA of the head and neck. Electronically Signed   By: Ulyses Jarred M.D.   On: 12/17/2017 21:50   Ct Head Wo Contrast  Result Date: 12/17/2017 CLINICAL DATA:  RIGHT-sided weakness for 1 hour. Gait instability. History of diabetes and stroke. EXAM: CT HEAD WITHOUT CONTRAST TECHNIQUE: Contiguous axial images were obtained from the base of the skull through the vertex without intravenous contrast. COMPARISON:  MRI head May 08, 2017 FINDINGS: BRAIN: No intraparenchymal hemorrhage, mass effect nor midline shift. The ventricles and sulci are normal. No acute large vascular territory infarcts. No abnormal extra-axial fluid collections. Basal cisterns are patent. Mild cerebellar tonsillar ectopia VASCULAR: Unremarkable. SKULL/SOFT TISSUES: No skull fracture. No significant soft tissue swelling. ORBITS/SINUSES: The included ocular globes and orbital contents are normal.Trace paranasal sinus mucosal thickening. Mastoid air cells are well aerated. OTHER: None. ASPECTS [facility] Stroke Program Early CT Score) - Ganglionic level infarction (caudate, lentiform nuclei, internal capsule, insula, M1-M3 cortex): 7 - Supraganglionic infarction (M4-M6 cortex): 3 Total score (0-10 with 10 being normal): 10 IMPRESSION: 1. No acute intracranial process. 2. Stable mild cerebellar tonsillar ectopia, incompletely imaged. 3. ASPECTS is 10. 4. Critical Value/emergent results were called by  telephone at the time of interpretation on 12/17/2017 at 9:01 pm to Dr. Nanda Quinton , who verbally acknowledged these results. Electronically Signed   By: Elon Alas M.D.   On: 12/17/2017 21:02   Ct Angio Neck W And/or Wo Contrast  Result Date: 12/17/2017 CLINICAL DATA:  Thunderclap headache with right-sided weakness. EXAM: CT ANGIOGRAPHY HEAD AND NECK TECHNIQUE: Multidetector CT imaging of the head and neck was performed using the standard protocol during bolus administration of intravenous contrast. Multiplanar  CT image reconstructions and MIPs were obtained to evaluate the vascular anatomy. Carotid stenosis measurements (when applicable) are obtained utilizing NASCET criteria, using the distal internal carotid diameter as the denominator. CONTRAST:  161mL ISOVUE-370 IOPAMIDOL (ISOVUE-370) INJECTION 76% COMPARISON:  Head CT 12/17/2017 CTA head neck 05/08/2017 FINDINGS: CTA NECK FINDINGS AORTIC ARCH: There is no calcific atherosclerosis of the aortic arch. There is no aneurysm, dissection or hemodynamically significant stenosis of the visualized ascending aorta and aortic arch. Conventional 3 vessel aortic branching pattern. The visualized proximal subclavian arteries are widely patent. RIGHT CAROTID SYSTEM: --Common carotid artery: Widely patent origin without common carotid artery dissection or aneurysm. --Internal carotid artery: No dissection, occlusion or aneurysm. No hemodynamically significant stenosis. --External carotid artery: No acute abnormality. LEFT CAROTID SYSTEM: --Common carotid artery: Widely patent origin without common carotid artery dissection or aneurysm. --Internal carotid artery:No dissection, occlusion or aneurysm. No hemodynamically significant stenosis. --External carotid artery: No acute abnormality. VERTEBRAL ARTERIES: Codominant configuration. Both origins are normal. No dissection, occlusion or flow-limiting stenosis to the vertebrobasilar confluence. SKELETON: There is no bony spinal canal stenosis. No lytic or blastic lesion. OTHER NECK: Normal pharynx, larynx and major salivary glands. No cervical lymphadenopathy. Unremarkable thyroid gland. UPPER CHEST: No pneumothorax or pleural effusion. No nodules or masses. CTA HEAD FINDINGS ANTERIOR CIRCULATION: --Intracranial internal carotid arteries: Normal. --Anterior cerebral arteries: Normal. Both A1 segments are present. Patent anterior communicating artery. --Middle cerebral arteries: Normal. --Posterior communicating arteries: Absent  bilaterally. POSTERIOR CIRCULATION: --Basilar artery: Normal. --Posterior cerebral arteries: Normal. --Superior cerebellar arteries: Normal. --Inferior cerebellar arteries: Normal anterior and posterior inferior cerebellar arteries. VENOUS SINUSES: Normal variant diminutive left transverse sinus. ANATOMIC VARIANTS: None DELAYED PHASE: No parenchymal contrast enhancement. Review of the MIP images confirms the above findings. IMPRESSION: Normal CTA of the head and neck. Electronically Signed   By: Ulyses Jarred M.D.   On: 12/17/2017 21:50   Mr Brain Wo Contrast  Result Date: 12/18/2017 CLINICAL DATA:  Headache for 3 days. Right-sided weakness and facial numbness. EXAM: MRI HEAD WITHOUT CONTRAST TECHNIQUE: Multiplanar, multiecho pulse sequences of the brain and surrounding structures were obtained without intravenous contrast. COMPARISON:  CTA head neck 12/17/2017 FINDINGS: BRAIN: There is no acute infarct, acute hemorrhage or mass effect. The midline structures are normal. There are no old infarcts. The white matter signal is normal for the patient's age. The CSF spaces are normal for age, with no hydrocephalus. Susceptibility-sensitive sequences show no chronic microhemorrhage or superficial siderosis. VASCULAR: Major intracranial arterial and venous sinus flow voids are preserved. SKULL AND UPPER CERVICAL SPINE: The visualized skull base, calvarium, upper cervical spine and extracranial soft tissues are normal. SINUSES/ORBITS: No fluid levels or advanced mucosal thickening. No mastoid or middle ear effusion. The orbits are normal. IMPRESSION: Normal MRI of the brain. Electronically Signed   By: Ulyses Jarred M.D.   On: 12/18/2017 02:38     EKG: Independently reviewed.  Sinus rhythm, QTC 399, poor R-wave progression, nonspecific T-wave change.  Assessment/Plan Principal Problem:   TIA (  transient ischemic attack) Active Problems:   Headache   HLD (hyperlipidemia)   Diabetes mellitus without complication  (HCC)   TIA: Patient symptoms are likely due to TIA.  Potential differential diagnosis is complex migraine per Dr. Lorraine Lax of neurology.  He recommended TIA work-up and ASA/Plavix, and to treat headache with Topamax.  - will place on tele bed for obs - Highly appreciated neurologist's consultation,will follow up recommendations - Plavix and ASA  - fasting lipid panel and HbA1c  - 2D transthoracic echocardiography  - PT/OT consult  Headache: -Topamax and PRN Tylenol  HLD (hyperlipidemia): -lipitor  Diabetes mellitus without complication ([place]): Last A1c 5.5 on 12/04/17, well controled. Patient is taking metformin at home -SSI   DVT ppx: SQ Lovenox Code Status: Full code Family Communication:    Yes, patient's  Wife at bed side Disposition Plan:  Anticipate discharge back to previous home environment Consults called:  Dr. Lorraine Lax of neuro Admission status: Obs / tele     Date of Service 12/18/2017    Ivor Costa [facility] Pager [phone]  If 7PM-7AM, please contact night-coverage [email or web address] Password [facility] 12/18/2017, 6:22 AM

## 2017-12-18 NOTE — Discharge Summary (Signed)
Drew [name], is a 42 y.o. male  DOB August 03, 1975  MRN [id number].  Admission date:  12/17/2017  Admitting Physician  Ivor Costa, MD  Discharge Date:  12/18/2017   Primary MD  Lanae Boast, FNP  Recommendations for primary care physician for things to follow:   1)Hold  metformin until 12/21/2017 2)Take all other medications including aspirin and cholesterol medicine as prescribed 3) take nortriptyline and Topamax as prescribed for headache prevention 4)Follow-up with Dr. Leonie Man or Dr. Jaynee Eagles at [facility] for migraine in 4 weeks 5)Smoking cessation advised , consider nicotine patch  Admission Diagnosis  facial,right side & hand numbness   Discharge Diagnosis  facial,right side & hand numbness    Principal Problem:   TIA (transient ischemic attack) Active Problems:   Headache   HLD (hyperlipidemia)   Diabetes mellitus without complication  [facility])      Past Medical History:  Diagnosis Date  . Facial nerve palsy   . Stroke ([place])   . Type 2 diabetes mellitus ([place])     Past Surgical History:  Procedure Laterality Date  . APPENDECTOMY         HPI  from the history and physical done on the day of admission:    Chief Complaint: left-sided weakness, headache  HPI: Drew [name] is a 42 y.o. male with medical history significant of hyperlipidemia, diabetes mellitus, stroke like symptoms in the past, who presents with right-sided weakness and headache.  Pt started having right-sided weakness and numbness, slurred speech and R facial numbness at about 3 PM.  No vision change or hearing loss.  Patient states that he has been having occipital headache in the past 3 days.  His symptoms have largely resolved except for mild headache.  Patient denies any chest pain, shortness of breath, cough.  No fever or chills.  No nausea, vomiting, diarrhea or abdominal pain.  No symptoms of UTI.  ED Course: pt was found to have  WBC 7.4, negative troponin, negative UDS, negative urinalysis, electrolytes renal function okay, bradycardia, no tachypnea, oxygen saturation 97% on room air, temperature normal.  MRI of the brain is negative.  CT angiogram of neck and head is negative.  CT head showed no acute intracranial process, but showed stable mild cerebellar tonsillar ectopia which incompletely imaged. Pt is placed on telemetry bed for observation.  Neurology, Dr. Lorraine Lax was consulted.    Hospital Course:    TTE Impressions: - Compared to a prior study in 2018, the LVEF is higher at 60-65%. 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 Consider bubble study.  Brain MRI without acute findings CTA head and neck without acute findings  Brief summary Drew [name] is a 42 y.o. male with history of diabetes, hypertension, hyperlipidemia, smoker, Bell's palsy, complicated migraine admitted for similar episode of right-sided weakness, slurred speech, left facial numbness and occipital headache. No tPA given due to mild symptoms  Plan:- 1)Complicated Migraine--stroke work-up was negative this time, patient had similar presentation in December 2018 with occipital headache and negative stroke  work-up at that time as well, neurology input appreciated.  UDS is negative.  Per neurologist okay to increase Topamax twice daily for headache prophylaxis may use nortriptyline 25 mg nightly as well headache prophylaxis. Follow-up with Dr. Leonie Man or Dr. Jaynee Eagles at [facility] for migraine  in 1 month for recheck, c/n aspirin and Lipitor , LDL is 120 and HDL is 41. Physical therapy evaluation appreciated, patient may be discharged home   2)DM2-recent A1c goal 5.5 on 12/04/2017, hold metformin until 12/21/2017 due to contrast study  3)Tobacco Abuse- Smoking cessation counseling for 4 minutes today, consider nicotine patch  Discharge Condition: stable  Follow UP  Follow-up Information    Garvin Fila, MD. Schedule an appointment as  soon as possible for a visit in 4 week(s).   Specialties:  Neurology, Radiology Contact information: [address] [place] [facility] [address] [phone]            Consults obtained - Neurology  Diet and Activity recommendation:  As advised  Discharge Instructions    Discharge Instructions    Call MD for:  difficulty breathing, headache or visual disturbances   Complete by:  As directed    Call MD for:  persistant dizziness or light-headedness   Complete by:  As directed    Call MD for:  persistant nausea and vomiting   Complete by:  As directed    Call MD for:  temperature >100.4   Complete by:  As directed    Diet - low sodium heart healthy   Complete by:  As directed    Diet Carb Modified   Complete by:  As directed    Discharge instructions   Complete by:  As directed    1)Hold  metformin until 12/21/2017 2)Take all other medications including aspirin and cholesterol medicine as prescribed 3) take nortriptyline and Topamax as prescribed for headache prevention 4)Follow-up with Dr. Leonie Man or Dr. Jaynee Eagles at [facility] for migraine in 4 weeks  5)Smoking cessation advised, consider nicotine patch   Increase activity slowly   Complete by:  As directed        Discharge Medications     Allergies as of 12/18/2017   No Known Allergies     Medication List    TAKE these medications   acetaminophen 325 MG tablet Commonly known as:  TYLENOL Take 650 mg by mouth every 6 (six) hours as needed for mild pain or headache.   aspirin 81 MG EC tablet Take 1 tablet (81 mg total) by mouth daily.   atorvastatin 80 MG tablet Commonly known as:  LIPITOR Take 1 tablet (80 mg total) by mouth daily at 6 PM.   cetirizine 10 MG tablet Commonly known as:  ZYRTEC Take 1 tablet (10 mg total) by mouth daily.   fluticasone 50 MCG/ACT nasal spray Commonly known as:  FLONASE Place 2 sprays into both nostrils daily.   meclizine 25 MG tablet Commonly known as:  ANTIVERT Take 1  tablet (25 mg total) by mouth 3 (three) times daily as needed for dizziness.   metFORMIN 500 MG tablet Commonly known as:  GLUCOPHAGE Take 1 tablet (500 mg total) by mouth daily with breakfast. Restart on 12/21/17 What changed:  additional instructions   nortriptyline 25 MG capsule Commonly known as:  PAMELOR Take 1 capsule (25 mg total) by mouth at bedtime. For headache prevention   senna-docusate 8.6-50 MG tablet Commonly known as:  Senokot-S Take 2 tablets by mouth at bedtime as needed for mild constipation.  topiramate 50 MG tablet Commonly known as:  TOPAMAX Take 1 tablet (50 mg total) by mouth 2 (two) times daily. For Headache Prevention What changed:    when to take this  additional instructions       Major procedures and Radiology Reports - PLEASE review detailed and final reports for all details, in brief -   Ct Angio Head W Or Wo Contrast  Result Date: 12/17/2017 CLINICAL DATA:  Thunderclap headache with right-sided weakness. EXAM: CT ANGIOGRAPHY HEAD AND NECK TECHNIQUE: Multidetector CT imaging of the head and neck was performed using the standard protocol during bolus administration of intravenous contrast. Multiplanar CT image reconstructions and MIPs were obtained to evaluate the vascular anatomy. Carotid stenosis measurements (when applicable) are obtained utilizing NASCET criteria, using the distal internal carotid diameter as the denominator. CONTRAST:  130mL ISOVUE-370 IOPAMIDOL (ISOVUE-370) INJECTION 76% COMPARISON:  Head CT 12/17/2017 CTA head neck 05/08/2017 FINDINGS: CTA NECK FINDINGS AORTIC ARCH: There is no calcific atherosclerosis of the aortic arch. There is no aneurysm, dissection or hemodynamically significant stenosis of the visualized ascending aorta and aortic arch. Conventional 3 vessel aortic branching pattern. The visualized proximal subclavian arteries are widely patent. RIGHT CAROTID SYSTEM: --Common carotid artery: Widely patent origin without common  carotid artery dissection or aneurysm. --Internal carotid artery: No dissection, occlusion or aneurysm. No hemodynamically significant stenosis. --External carotid artery: No acute abnormality. LEFT CAROTID SYSTEM: --Common carotid artery: Widely patent origin without common carotid artery dissection or aneurysm. --Internal carotid artery:No dissection, occlusion or aneurysm. No hemodynamically significant stenosis. --External carotid artery: No acute abnormality. VERTEBRAL ARTERIES: Codominant configuration. Both origins are normal. No dissection, occlusion or flow-limiting stenosis to the vertebrobasilar confluence. SKELETON: There is no bony spinal canal stenosis. No lytic or blastic lesion. OTHER NECK: Normal pharynx, larynx and major salivary glands. No cervical lymphadenopathy. Unremarkable thyroid gland. UPPER CHEST: No pneumothorax or pleural effusion. No nodules or masses. CTA HEAD FINDINGS ANTERIOR CIRCULATION: --Intracranial internal carotid arteries: Normal. --Anterior cerebral arteries: Normal. Both A1 segments are present. Patent anterior communicating artery. --Middle cerebral arteries: Normal. --Posterior communicating arteries: Absent bilaterally. POSTERIOR CIRCULATION: --Basilar artery: Normal. --Posterior cerebral arteries: Normal. --Superior cerebellar arteries: Normal. --Inferior cerebellar arteries: Normal anterior and posterior inferior cerebellar arteries. VENOUS SINUSES: Normal variant diminutive left transverse sinus. ANATOMIC VARIANTS: None DELAYED PHASE: No parenchymal contrast enhancement. Review of the MIP images confirms the above findings. IMPRESSION: Normal CTA of the head and neck. Electronically Signed   By: Ulyses Jarred M.D.   On: 12/17/2017 21:50   Ct Head Wo Contrast  Result Date: 12/17/2017 CLINICAL DATA:  RIGHT-sided weakness for 1 hour. Gait instability. History of diabetes and stroke. EXAM: CT HEAD WITHOUT CONTRAST TECHNIQUE: Contiguous axial images were obtained from  the base of the skull through the vertex without intravenous contrast. COMPARISON:  MRI head May 08, 2017 FINDINGS: BRAIN: No intraparenchymal hemorrhage, mass effect nor midline shift. The ventricles and sulci are normal. No acute large vascular territory infarcts. No abnormal extra-axial fluid collections. Basal cisterns are patent. Mild cerebellar tonsillar ectopia VASCULAR: Unremarkable. SKULL/SOFT TISSUES: No skull fracture. No significant soft tissue swelling. ORBITS/SINUSES: The included ocular globes and orbital contents are normal.Trace paranasal sinus mucosal thickening. Mastoid air cells are well aerated. OTHER: None. ASPECTS [facility] Stroke Program Early CT Score) - Ganglionic level infarction (caudate, lentiform nuclei, internal capsule, insula, M1-M3 cortex): 7 - Supraganglionic infarction (M4-M6 cortex): 3 Total score (0-10 with 10 being normal): 10 IMPRESSION: 1. No acute intracranial process. 2. Stable mild cerebellar  tonsillar ectopia, incompletely imaged. 3. ASPECTS is 10. 4. Critical Value/emergent results were called by telephone at the time of interpretation on 12/17/2017 at 9:01 pm to Dr. Nanda Quinton , who verbally acknowledged these results. Electronically Signed   By: Elon Alas M.D.   On: 12/17/2017 21:02   Ct Angio Neck W And/or Wo Contrast  Result Date: 12/17/2017 CLINICAL DATA:  Thunderclap headache with right-sided weakness. EXAM: CT ANGIOGRAPHY HEAD AND NECK TECHNIQUE: Multidetector CT imaging of the head and neck was performed using the standard protocol during bolus administration of intravenous contrast. Multiplanar CT image reconstructions and MIPs were obtained to evaluate the vascular anatomy. Carotid stenosis measurements (when applicable) are obtained utilizing NASCET criteria, using the distal internal carotid diameter as the denominator. CONTRAST:  16mL ISOVUE-370 IOPAMIDOL (ISOVUE-370) INJECTION 76% COMPARISON:  Head CT 12/17/2017 CTA head neck 05/08/2017  FINDINGS: CTA NECK FINDINGS AORTIC ARCH: There is no calcific atherosclerosis of the aortic arch. There is no aneurysm, dissection or hemodynamically significant stenosis of the visualized ascending aorta and aortic arch. Conventional 3 vessel aortic branching pattern. The visualized proximal subclavian arteries are widely patent. RIGHT CAROTID SYSTEM: --Common carotid artery: Widely patent origin without common carotid artery dissection or aneurysm. --Internal carotid artery: No dissection, occlusion or aneurysm. No hemodynamically significant stenosis. --External carotid artery: No acute abnormality. LEFT CAROTID SYSTEM: --Common carotid artery: Widely patent origin without common carotid artery dissection or aneurysm. --Internal carotid artery:No dissection, occlusion or aneurysm. No hemodynamically significant stenosis. --External carotid artery: No acute abnormality. VERTEBRAL ARTERIES: Codominant configuration. Both origins are normal. No dissection, occlusion or flow-limiting stenosis to the vertebrobasilar confluence. SKELETON: There is no bony spinal canal stenosis. No lytic or blastic lesion. OTHER NECK: Normal pharynx, larynx and major salivary glands. No cervical lymphadenopathy. Unremarkable thyroid gland. UPPER CHEST: No pneumothorax or pleural effusion. No nodules or masses. CTA HEAD FINDINGS ANTERIOR CIRCULATION: --Intracranial internal carotid arteries: Normal. --Anterior cerebral arteries: Normal. Both A1 segments are present. Patent anterior communicating artery. --Middle cerebral arteries: Normal. --Posterior communicating arteries: Absent bilaterally. POSTERIOR CIRCULATION: --Basilar artery: Normal. --Posterior cerebral arteries: Normal. --Superior cerebellar arteries: Normal. --Inferior cerebellar arteries: Normal anterior and posterior inferior cerebellar arteries. VENOUS SINUSES: Normal variant diminutive left transverse sinus. ANATOMIC VARIANTS: None DELAYED PHASE: No parenchymal contrast  enhancement. Review of the MIP images confirms the above findings. IMPRESSION: Normal CTA of the head and neck. Electronically Signed   By: Ulyses Jarred M.D.   On: 12/17/2017 21:50   Mr Brain Wo Contrast  Result Date: 12/18/2017 CLINICAL DATA:  Headache for 3 days. Right-sided weakness and facial numbness. EXAM: MRI HEAD WITHOUT CONTRAST TECHNIQUE: Multiplanar, multiecho pulse sequences of the brain and surrounding structures were obtained without intravenous contrast. COMPARISON:  CTA head neck 12/17/2017 FINDINGS: BRAIN: There is no acute infarct, acute hemorrhage or mass effect. The midline structures are normal. There are no old infarcts. The white matter signal is normal for the patient's age. The CSF spaces are normal for age, with no hydrocephalus. Susceptibility-sensitive sequences show no chronic microhemorrhage or superficial siderosis. VASCULAR: Major intracranial arterial and venous sinus flow voids are preserved. SKULL AND UPPER CERVICAL SPINE: The visualized skull base, calvarium, upper cervical spine and extracranial soft tissues are normal. SINUSES/ORBITS: No fluid levels or advanced mucosal thickening. No mastoid or middle ear effusion. The orbits are normal. IMPRESSION: Normal MRI of the brain. Electronically Signed   By: Ulyses Jarred M.D.   On: 12/18/2017 02:38    Micro Results  No results found for  this or any previous visit (from the past 240 hour(s)).   Today   Subjective    Drew [name] today has no new complaints, neuro symptoms appears to be mostly resolved,       wife at bedside, questions answered   Patient has been seen and examined prior to discharge   Objective   Blood pressure 117/75, pulse (!) 49, temperature 98.2 F (36.8 C), resp. rate 18, height 5\' 9"  (1.753 m), weight 82.1 kg, SpO2 99 %.  No intake or output data in the 24 hours ending 12/18/17 1429  Exam Gen:- Awake Alert,  In no apparent distress ,  HEENT:- [place].AT, No sclera icterus Neck-Supple  Neck,No JVD,.  Lungs-  CTAB , good air movement CV- S1, S2 normal Abd-  +ve B.Sounds, Abd Soft, No tenderness,    Extremity/Skin:- No  edema,   good pulses Psych-affect is appropriate, oriented x3 Neuro-no new focal deficits except for mild left cheek patchy area of decreased light touch sensation, no tremors   Data Review   CBC w Diff:  Lab Results  Component Value Date   WBC 7.4 12/17/2017   HGB 15.0 12/17/2017   HCT 41.2 12/17/2017   PLT 236 12/17/2017   LYMPHOPCT 44 12/17/2017   MONOPCT 7 12/17/2017   EOSPCT 3 12/17/2017   BASOPCT 1 12/17/2017    CMP:  Lab Results  Component Value Date   NA 140 12/17/2017   NA 144 12/04/2017   K 3.5 12/17/2017   CL 104 12/17/2017   CO2 25 12/17/2017   BUN 20 12/17/2017   BUN 14 12/04/2017   CREATININE 0.98 12/17/2017   PROT 7.4 12/17/2017   PROT 6.8 12/04/2017   ALBUMIN 4.5 12/17/2017   ALBUMIN 4.5 12/04/2017   BILITOT 0.5 12/17/2017   BILITOT 0.6 12/04/2017   ALKPHOS 85 12/17/2017   AST 27 12/17/2017   ALT 25 12/17/2017  .   Total Discharge time is about 33 minutes  Roxan Hockey M.D on 12/18/2017 at 2:29 PM   Go to [email or web address] - password [email or web address] for contact info  [facility] - Office  [phone]

## 2017-12-18 NOTE — ED Notes (Signed)
Assumed care of pt, pt BLE TO WALK TO BED,  Wife at bedside, pt eating lunch , placed on monitor

## 2017-12-18 NOTE — ED Notes (Signed)
Patient transported to MRI, denies claustrophobia

## 2017-12-18 NOTE — ED Notes (Signed)
Carb Modified Diet was ordered for Dinner. 

## 2017-12-18 NOTE — Progress Notes (Signed)
  Echocardiogram 2D Echocardiogram has been performed.  Drew [name] F 12/18/2017, 10:45 AM

## 2017-12-18 NOTE — Discharge Instructions (Signed)
1)Hold  metformin until 12/21/2017 2)Take all other medications including aspirin and cholesterol medicine as prescribed 3) take nortriptyline and Topamax as prescribed for headache prevention 4)Follow-up with Dr. Leonie Man or Dr. Jaynee Eagles at [facility] for migraine in 4 weeks 5)Smoking cessation advised , consider nicotine patch

## 2017-12-18 NOTE — Progress Notes (Signed)
Physical Therapy Evaluation and Discharge Patient Details Name: Drew [name] MRN: [id number] DOB: 12-03-1975 Today's Date: 12/18/2017   History of Present Illness  Pt is a 42 y/o male admitted secondary to R extremity weakness and numbness, however, symptoms have improved. MRI negative for acute abnormality. PMH includes DM, HTN, and CVA.   Clinical Impression  Patient evaluated by Physical Therapy with no further acute PT needs identified. All education has been completed and the patient has no further questions. Pt overall steady when performing dynamic gait tasks, requiring supervision for safety.. Reports he is close to baseline when performing mobility tasks. Will have assist from his wife at home. See below for any follow-up Physical Therapy or equipment needs. PT is signing off. Thank you for this referral. If needs change, please re-consult.     Follow Up Recommendations No PT follow up    Equipment Recommendations  None recommended by PT    Recommendations for Other Services       Precautions / Restrictions Precautions Precautions: None Restrictions Weight Bearing Restrictions: No      Mobility  Bed Mobility Overal bed mobility: Independent                Transfers Overall transfer level: Independent                  Ambulation/Gait Ambulation/Gait assistance: Supervision Gait Distance (Feet): 200 Feet Assistive device: None Gait Pattern/deviations: Step-through pattern Gait velocity: WFL    General Gait Details: Overall steady gait. Able to perform dynamic gait tasks without LOB. Supervision for safety.   Stairs            Wheelchair Mobility    Modified Rankin (Stroke Patients Only)       Balance Overall balance assessment: Needs assistance Sitting-balance support: No upper extremity supported;Feet supported Sitting balance-Leahy Scale: Normal     Standing balance support: No upper extremity supported;During functional  activity Standing balance-Leahy Scale: Good                   Standardized Balance Assessment Standardized Balance Assessment : Dynamic Gait Index   Dynamic Gait Index Level Surface: Normal Change in Gait Speed: Normal Gait with Horizontal Head Turns: Normal Gait with Vertical Head Turns: Normal Step Over Obstacle: Normal       Pertinent Vitals/Pain Pain Assessment: No/denies pain    Home Living Family/patient expects to be discharged to:: Private residence Living Arrangements: Spouse/significant other;Children Available Help at Discharge: Family Type of Home: House Home Access: Stairs to enter Entrance Stairs-Rails: Right Entrance Stairs-Number of Steps: 8-10 Home Layout: Two level;Able to live on main level with bedroom/bathroom Home Equipment: None      Prior Function Level of Independence: Independent               Hand Dominance   Dominant Hand: Right    Extremity/Trunk Assessment   Upper Extremity Assessment Upper Extremity Assessment: Overall WFL for tasks assessed    Lower Extremity Assessment Lower Extremity Assessment: Overall WFL for tasks assessed    Cervical / Trunk Assessment Cervical / Trunk Assessment: Normal  Communication   Communication: Prefers language other than English(speaks/understand english; friend interpreted when needed)  Cognition Arousal/Alertness: Awake/alert Behavior During Therapy: WFL for tasks assessed/performed Overall Cognitive Status: Within Functional Limits for tasks assessed  General Comments General comments (skin integrity, edema, etc.): Pt's wife present during session.     Exercises     Assessment/Plan    PT Assessment Patent does not need any further PT services  PT Problem List         PT Treatment Interventions      PT Goals (Current goals can be found in the Care Plan section)  Acute Rehab PT Goals Patient Stated Goal: "to go  home"  PT Goal Formulation: With patient Time For Goal Achievement: 12/18/17 Potential to Achieve Goals: Good    Frequency     Barriers to discharge        Co-evaluation               AM-PAC PT "6 Clicks" Daily Activity  Outcome Measure Difficulty turning over in bed (including adjusting bedclothes, sheets and blankets)?: None Difficulty moving from lying on back to sitting on the side of the bed? : None Difficulty sitting down on and standing up from a chair with arms (e.g., wheelchair, bedside commode, etc,.)?: None Help needed moving to and from a bed to chair (including a wheelchair)?: None Help needed walking in hospital room?: None Help needed climbing 3-5 steps with a railing? : A Little 6 Click Score: 23    End of Session Equipment Utilized During Treatment: Gait belt Activity Tolerance: Patient tolerated treatment well Patient left: in bed;with call bell/phone within reach Nurse Communication: Mobility status PT Visit Diagnosis: Other symptoms and signs involving the nervous system (R29.898)    Time: 0102-7253 PT Time Calculation (min) (ACUTE ONLY): 10 min   Charges:   PT Evaluation $PT Eval Low Complexity: [place], PT, DPT  Acute Rehabilitation Services  Pager: [phone]   Rudean Hitt 41/12/2017, 1:34 PM

## 2017-12-18 NOTE — ED Notes (Signed)
Heart Healthy Breakfast Tray Ordered @ 0822-per [place], RN-paged by Levada Dy

## 2017-12-18 NOTE — ED Notes (Signed)
Pt ambulated to restroom with steady gait.

## 2017-12-18 NOTE — ED Notes (Signed)
MD informed of bradycardia, EKG fired, MD looked at EKG, no barriers to d/c at this time

## 2017-12-18 NOTE — Consult Note (Signed)
[facility] Consult Services Impression:  (CTA head/neck with  perfusion) Hx of complicated migraines with headache for three days, and right sided weakness, right leg numbness, and left facial numbness. He has gotten CT. For the last three days he has felt dizzy and light headed.  #1. No TPA, we do need a stat MRI  #2. CT head neck with a perfusion,  #3. Last known, well was three days prior, acutely worsened 1 hour ago with weakness.  #4. Headache was present, does have history of migraines.  #5. Will need CTA head/neck and perufusion.  #6. Possible Complex Migraine. (  Comments: LKN: 3 days prior - dizzy light headed with headache.  21:06, call time to telepsecialists 21:10, connect time   Recommendations:  Stat MRI of brain without contrast.  No ASA until MRI or CTA head/neck and perfusion are clear.  Stroke protocol admission/ orderset suggested with placement on stroke floor tele monitoring Bedside swallow evaluation HOB less than 30 degrees IV Fluid hydration with NS Euglycemia avoid hyperthermia, PRN acetaminophen dvt ppx Consider inpatient neurology consultation Discussed with ED MD Please call with questions ----------------------------------------------------------------------------------------- CC History of Present Illness    Hx of complicated migraines with headache for three days, and right sided weakness, right leg numbness, and left facial numbness. He has gotten CT. For the last three days he has felt dizzy and light headed.  #1. No TPA, we do need a stat MRI  #2. CT head neck with a perfusion,  #3. Last known, well was three days prior, acutely worsened 1 hour ago with weakness.  #4. Headache was present, does have history of migraines.  #5. Will need CTA head/neck and perufusion.  #6. CTA head/neck is negative.  DOS was 8/7   Diagnostic: CT head is negative.  Exam: Patient is in no apparent distress. Patient appears as stated age. No  obvious acute respiratory or cardiac distress. Patient is well groomed and well-nourished. NIHSS score:  1A: Level of Consciousness - Requires repeated stimulation to arouse 0 1B: Ask Month and Age - 0 Questions Right 0 1C: 'Blink Eyes' & 'Squeeze Hands' - Performs 0 Tasks 0 2: Test Horizontal Extraocular Movements - Partial Gaze Palsy: Corrects with Oculocephalic Reflex 0 3: Test Visual Fields - No Visual Loss 0 4: Test Facial Palsy - Normal symmetry 0 5A: Test Left Arm Motor Drift - Some Effort Against Gravity 0 5B: Test Right Arm Motor Drift - Some Effort Against Gravity0 6A: Test Left Leg Motor Drift - Some Effort Against Gravity 0 6B: Test Right Leg Motor Drift - Some Effort Against Gravity 2 7: Test Limb Ataxia - Ataxia in 2 Limbs 0 8: Test Sensation - Complete Loss: Cannot Sense Being Touched At All 0 9: Test Language/Aphasia- Severe Aphasia: Fragmentary Expression, Inference Needed, Cannot Identify Materials 0 10: Test Dysarthria - Mute/Anarthric 0 11: Test Extinction/Inattention - Extinction to bilateral simultaneous stimulation 0    Medical Decision Making: - Extensive number of diagnosis or management options are considered above. - Extensive amount of complex data reviewed. - High risk of complication and/or morbidity or mortality are associated with differential diagnostic considerations above. - There may be Uncertain outcome and increased probability of prolonged functional impairment or high probability of severe prolonged functional impairment associated with some of these differential diagnosis. Medical Data Reviewed: 1.Data reviewed include clinical labs, radiology,Medical Tests; 2.Tests results discussed w/performing or interpreting physician; 3.Obtaining/reviewing old medical records; 4.Obtaining case history from another source; 5.Independent review of image, tracing or  specimen

## 2017-12-18 NOTE — Progress Notes (Signed)
STROKE TEAM PROGRESS NOTE   SUBJECTIVE (INTERVAL HISTORY) His wife is at the bedside also served as interpretor.  Overall he feels his condition is largely back to baseline. Still has patchy left facial numbness but mild. Still has slight occipital HA today but much improved. No weakness. MRI negative for stroke.    OBJECTIVE Temp:  [98.2 F (36.8 C)] 98.2 F (36.8 C) (08/07 2130) Pulse Rate:  [45-66] 49 (08/08 1108) Cardiac Rhythm: Sinus bradycardia (08/08 1104) Resp:  [15-19] 18 (08/08 1108) BP: (104-159)/(67-98) 117/75 (08/08 1108) SpO2:  [97 %-100 %] 99 % (08/08 1108) Weight:  [82.1 kg] 82.1 kg (08/07 2044)  Recent Labs  Lab 12/18/17 0829  GLUCAP 112*   Recent Labs  Lab 12/17/17 2058  NA 140  K 3.5  CL 104  CO2 25  GLUCOSE 149*  BUN 20  CREATININE 0.98  CALCIUM 9.4   Recent Labs  Lab 12/17/17 2058  AST 27  ALT 25  ALKPHOS 85  BILITOT 0.5  PROT 7.4  ALBUMIN 4.5   Recent Labs  Lab 12/17/17 2058  WBC 7.4  NEUTROABS 3.4  HGB 15.0  HCT 41.2  MCV 90.2  PLT 236   Recent Labs  Lab 12/17/17 2058  TROPONINI <0.03   Recent Labs    12/17/17 2058  LABPROT 12.1  INR 0.90   Recent Labs    12/17/17 2149  COLORURINE YELLOW  LABSPEC 1.010  PHURINE 7.5  GLUCOSEU NEGATIVE  HGBUR NEGATIVE  BILIRUBINUR NEGATIVE  KETONESUR NEGATIVE  PROTEINUR NEGATIVE  NITRITE NEGATIVE  LEUKOCYTESUR NEGATIVE       Component Value Date/Time   CHOL 194 12/04/2017 1442   TRIG 164 (H) 12/04/2017 1442   HDL 41 12/04/2017 1442   CHOLHDL 4.7 12/04/2017 1442   CHOLHDL 6.6 05/08/2017 0903   VLDL 76 (H) 05/08/2017 0903   LDLCALC 120 (H) 12/04/2017 1442   Lab Results  Component Value Date   HGBA1C 5.5 12/04/2017      Component Value Date/Time   LABOPIA NONE DETECTED 12/17/2017 2149   COCAINSCRNUR NONE DETECTED 12/17/2017 2149   LABBENZ NONE DETECTED 12/17/2017 2149   AMPHETMU NONE DETECTED 12/17/2017 2149   THCU NONE DETECTED 12/17/2017 2149   LABBARB NONE  DETECTED 12/17/2017 2149    Recent Labs  Lab 12/17/17 2058  ETH <10    I have personally reviewed the radiological images below and agree with the radiology interpretations.  Ct Angio Head W Or Wo Contrast  Result Date: 12/17/2017 CLINICAL DATA:  Thunderclap headache with right-sided weakness. EXAM: CT ANGIOGRAPHY HEAD AND NECK TECHNIQUE: Multidetector CT imaging of the head and neck was performed using the standard protocol during bolus administration of intravenous contrast. Multiplanar CT image reconstructions and MIPs were obtained to evaluate the vascular anatomy. Carotid stenosis measurements (when applicable) are obtained utilizing NASCET criteria, using the distal internal carotid diameter as the denominator. CONTRAST:  151mL ISOVUE-370 IOPAMIDOL (ISOVUE-370) INJECTION 76% COMPARISON:  Head CT 12/17/2017 CTA head neck 05/08/2017 FINDINGS: CTA NECK FINDINGS AORTIC ARCH: There is no calcific atherosclerosis of the aortic arch. There is no aneurysm, dissection or hemodynamically significant stenosis of the visualized ascending aorta and aortic arch. Conventional 3 vessel aortic branching pattern. The visualized proximal subclavian arteries are widely patent. RIGHT CAROTID SYSTEM: --Common carotid artery: Widely patent origin without common carotid artery dissection or aneurysm. --Internal carotid artery: No dissection, occlusion or aneurysm. No hemodynamically significant stenosis. --External carotid artery: No acute abnormality. LEFT CAROTID SYSTEM: --Common carotid artery: Widely patent  origin without common carotid artery dissection or aneurysm. --Internal carotid artery:No dissection, occlusion or aneurysm. No hemodynamically significant stenosis. --External carotid artery: No acute abnormality. VERTEBRAL ARTERIES: Codominant configuration. Both origins are normal. No dissection, occlusion or flow-limiting stenosis to the vertebrobasilar confluence. SKELETON: There is no bony spinal canal  stenosis. No lytic or blastic lesion. OTHER NECK: Normal pharynx, larynx and major salivary glands. No cervical lymphadenopathy. Unremarkable thyroid gland. UPPER CHEST: No pneumothorax or pleural effusion. No nodules or masses. CTA HEAD FINDINGS ANTERIOR CIRCULATION: --Intracranial internal carotid arteries: Normal. --Anterior cerebral arteries: Normal. Both A1 segments are present. Patent anterior communicating artery. --Middle cerebral arteries: Normal. --Posterior communicating arteries: Absent bilaterally. POSTERIOR CIRCULATION: --Basilar artery: Normal. --Posterior cerebral arteries: Normal. --Superior cerebellar arteries: Normal. --Inferior cerebellar arteries: Normal anterior and posterior inferior cerebellar arteries. VENOUS SINUSES: Normal variant diminutive left transverse sinus. ANATOMIC VARIANTS: None DELAYED PHASE: No parenchymal contrast enhancement. Review of the MIP images confirms the above findings. IMPRESSION: Normal CTA of the head and neck. Electronically Signed   By: Ulyses Jarred M.D.   On: 12/17/2017 21:50   Ct Head Wo Contrast  Result Date: 12/17/2017 CLINICAL DATA:  RIGHT-sided weakness for 1 hour. Gait instability. History of diabetes and stroke. EXAM: CT HEAD WITHOUT CONTRAST TECHNIQUE: Contiguous axial images were obtained from the base of the skull through the vertex without intravenous contrast. COMPARISON:  MRI head May 08, 2017 FINDINGS: BRAIN: No intraparenchymal hemorrhage, mass effect nor midline shift. The ventricles and sulci are normal. No acute large vascular territory infarcts. No abnormal extra-axial fluid collections. Basal cisterns are patent. Mild cerebellar tonsillar ectopia VASCULAR: Unremarkable. SKULL/SOFT TISSUES: No skull fracture. No significant soft tissue swelling. ORBITS/SINUSES: The included ocular globes and orbital contents are normal.Trace paranasal sinus mucosal thickening. Mastoid air cells are well aerated. OTHER: None. ASPECTS [facility] Stroke  Program Early CT Score) - Ganglionic level infarction (caudate, lentiform nuclei, internal capsule, insula, M1-M3 cortex): 7 - Supraganglionic infarction (M4-M6 cortex): 3 Total score (0-10 with 10 being normal): 10 IMPRESSION: 1. No acute intracranial process. 2. Stable mild cerebellar tonsillar ectopia, incompletely imaged. 3. ASPECTS is 10. 4. Critical Value/emergent results were called by telephone at the time of interpretation on 12/17/2017 at 9:01 pm to Dr. Nanda Quinton , who verbally acknowledged these results. Electronically Signed   By: Elon Alas M.D.   On: 12/17/2017 21:02   Ct Angio Neck W And/or Wo Contrast  Result Date: 12/17/2017 CLINICAL DATA:  Thunderclap headache with right-sided weakness. EXAM: CT ANGIOGRAPHY HEAD AND NECK TECHNIQUE: Multidetector CT imaging of the head and neck was performed using the standard protocol during bolus administration of intravenous contrast. Multiplanar CT image reconstructions and MIPs were obtained to evaluate the vascular anatomy. Carotid stenosis measurements (when applicable) are obtained utilizing NASCET criteria, using the distal internal carotid diameter as the denominator. CONTRAST:  178mL ISOVUE-370 IOPAMIDOL (ISOVUE-370) INJECTION 76% COMPARISON:  Head CT 12/17/2017 CTA head neck 05/08/2017 FINDINGS: CTA NECK FINDINGS AORTIC ARCH: There is no calcific atherosclerosis of the aortic arch. There is no aneurysm, dissection or hemodynamically significant stenosis of the visualized ascending aorta and aortic arch. Conventional 3 vessel aortic branching pattern. The visualized proximal subclavian arteries are widely patent. RIGHT CAROTID SYSTEM: --Common carotid artery: Widely patent origin without common carotid artery dissection or aneurysm. --Internal carotid artery: No dissection, occlusion or aneurysm. No hemodynamically significant stenosis. --External carotid artery: No acute abnormality. LEFT CAROTID SYSTEM: --Common carotid artery: Widely patent  origin without common carotid artery dissection or aneurysm. --Internal  carotid artery:No dissection, occlusion or aneurysm. No hemodynamically significant stenosis. --External carotid artery: No acute abnormality. VERTEBRAL ARTERIES: Codominant configuration. Both origins are normal. No dissection, occlusion or flow-limiting stenosis to the vertebrobasilar confluence. SKELETON: There is no bony spinal canal stenosis. No lytic or blastic lesion. OTHER NECK: Normal pharynx, larynx and major salivary glands. No cervical lymphadenopathy. Unremarkable thyroid gland. UPPER CHEST: No pneumothorax or pleural effusion. No nodules or masses. CTA HEAD FINDINGS ANTERIOR CIRCULATION: --Intracranial internal carotid arteries: Normal. --Anterior cerebral arteries: Normal. Both A1 segments are present. Patent anterior communicating artery. --Middle cerebral arteries: Normal. --Posterior communicating arteries: Absent bilaterally. POSTERIOR CIRCULATION: --Basilar artery: Normal. --Posterior cerebral arteries: Normal. --Superior cerebellar arteries: Normal. --Inferior cerebellar arteries: Normal anterior and posterior inferior cerebellar arteries. VENOUS SINUSES: Normal variant diminutive left transverse sinus. ANATOMIC VARIANTS: None DELAYED PHASE: No parenchymal contrast enhancement. Review of the MIP images confirms the above findings. IMPRESSION: Normal CTA of the head and neck. Electronically Signed   By: Ulyses Jarred M.D.   On: 12/17/2017 21:50   Mr Brain Wo Contrast  Result Date: 12/18/2017 CLINICAL DATA:  Headache for 3 days. Right-sided weakness and facial numbness. EXAM: MRI HEAD WITHOUT CONTRAST TECHNIQUE: Multiplanar, multiecho pulse sequences of the brain and surrounding structures were obtained without intravenous contrast. COMPARISON:  CTA head neck 12/17/2017 FINDINGS: BRAIN: There is no acute infarct, acute hemorrhage or mass effect. The midline structures are normal. There are no old infarcts. The white matter  signal is normal for the patient's age. The CSF spaces are normal for age, with no hydrocephalus. Susceptibility-sensitive sequences show no chronic microhemorrhage or superficial siderosis. VASCULAR: Major intracranial arterial and venous sinus flow voids are preserved. SKULL AND UPPER CERVICAL SPINE: The visualized skull base, calvarium, upper cervical spine and extracranial soft tissues are normal. SINUSES/ORBITS: No fluid levels or advanced mucosal thickening. No mastoid or middle ear effusion. The orbits are normal. IMPRESSION: Normal MRI of the brain. Electronically Signed   By: Ulyses Jarred M.D.   On: 12/18/2017 02:38    TTE  - Left ventricle: The cavity size was normal. Wall thickness was   increased in a pattern of moderate LVH. Systolic function was   normal. The estimated ejection fraction was in the range of 60%   to 65%. Wall motion was normal; there were no regional wall   motion abnormalities. Left ventricular diastolic function   parameters were normal. - Mitral valve: Mildly thickened leaflets . There was trivial   regurgitation. - Left atrium: Severely dilated. - Right atrium: The atrium was mildly dilated. - Atrial septum: Aneurysmal IAS - cannot exclude PFO. - Systemic veins: Not visualized. Impressions: - Compared to a prior study in 2018, the LVEF is higher at 60-65%.   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 Consider   bubble study.   PHYSICAL EXAM  Temp:  [98.2 F (36.8 C)] 98.2 F (36.8 C) (08/07 2130) Pulse Rate:  [45-66] 49 (08/08 1108) Resp:  [15-19] 18 (08/08 1108) BP: (104-159)/(67-98) 117/75 (08/08 1108) SpO2:  [97 %-100 %] 99 % (08/08 1108) Weight:  [82.1 kg] 82.1 kg (08/07 2044)  General - Well nourished, well developed, in no apparent distress.  Ophthalmologic - fundi not visualized due to noncooperation.  Cardiovascular - Regular rate and rhythm.  Mental Status -  Level of arousal and orientation to time, place, and person  were intact. Language including expression, naming, repetition, comprehension was assessed and found intact. Spanish speaking male.  Fund of Knowledge was assessed  and was intact.  Cranial Nerves II - XII - II - Visual field intact OU. III, IV, VI - Extraocular movements intact. V - two patchy spot of decreased light touch at left cheek area. VII - Facial movement intact bilaterally. VIII - Hearing & vestibular intact bilaterally. X - Palate elevates symmetrically. XI - Chin turning & shoulder shrug intact bilaterally. XII - Tongue protrusion intact.  Motor Strength - The patient's strength was normal in all extremities and pronator drift was absent.  Bulk was normal and fasciculations were absent.   Motor Tone - Muscle tone was assessed at the neck and appendages and was normal.  Reflexes - The patient's reflexes were symmetrical in all extremities and he had no pathological reflexes.  Sensory - Light touch, temperature/pinprick were assessed and were symmetrical.    Coordination - The patient had normal movements in the hands and feet with no ataxia or dysmetria.  Tremor was absent.  Gait and Station - deferred.   ASSESSMENT/PLAN Mr. Opie Maclaughlin is a 42 y.o. male with history of diabetes, hypertension, hyperlipidemia, smoker, Bell's palsy, complicated migraine admitted for similar episode of right-sided weakness, slurred speech, left facial numbness and occipital headache. No tPA given due to mild symptoms.    Complicated migraine - similar episode as of 04/2017 with occipital headache, MRI negative.  Resultant left cheek patchy decreased light touch sensation  MRI normal brain  CT head and neck unremarkable  2D Echo EF 60 to 65%  LDL pending  HgbA1c pending  UDS negative  Lovenox for VTE prophylaxis  aspirin 81 mg daily prior to admission, now on aspirin 325 mg daily.  Recommend to resume aspirin 81 on discharge.  Increase Topamax from 50 mg once a day to 50 mg  twice daily.  Patient counseled to be compliant with his antithrombotic medications  Ongoing aggressive stroke risk factor management  Therapy recommendations:  none  Disposition:  Pending  Follow-up with Dr. Leonie Man or Dr. Jaynee Eagles at [facility] for migraine   Diabetes  HgbA1c pending goal < 7.0  Controlled  Home on metformin  CBG monitoring  SSI  Hypertension Stable.  Long term BP goal normotensive  Hyperlipidemia  Home meds: Lipitor 80  LDL pending, goal < 70  Now on Lipitor 80  Continue statin at discharge  Tobacco abuse  Current smoker  Smoking cessation counseling provided  Pt is willing to quit  Other Stroke Risk Factors    Other Active Problems  History of Bell's palsy  Spanish-speaking  Hospital day # 0  Neurology will sign off. Please call with questions. Pt will follow up with Dr. Leonie Man or Dr. Jaynee Eagles at [facility] in about 4 weeks. Thanks for the consult.   Rosalin Hawking, MD PhD Stroke Neurology 12/18/2017 2:08 PM    To contact Stroke Continuity provider, please refer to [email or web address] After hours, contact General Neurology

## 2017-12-18 NOTE — Consult Note (Signed)
Requesting Physician: Dr. Betsey Holiday    Chief Complaint: right side weakness, numbness, slurred speech and left facial numbness  History obtained from: Patient and Chart     HPI:                                                                                                                                       Drew [name] is an 42 y.o. male with PMH of HTN, HLD, DM with history of strokelike symptoms in 2018 where he had a lower facial droop and left facial numbness presents with sudden onset right side weakness and numbness, slurred speech and left facial numbness around 3 pm. Patient had difficulty with walking and presented to OSH ER. History of occipital headache x 3 days. BP 159/77 on arrival to ED. Was seen by tele neurology who recommended admission ( Note unavailable). EDP called me who thought this maybe a complicated migraine and asked for face to face neurological evaluation and transferred to [facility] for MRI Brain.  Past Medical History:  Diagnosis Date  . Facial nerve palsy   . Stroke ([place])   . Type 2 diabetes mellitus ([place])     Past Surgical History:  Procedure Laterality Date  . APPENDECTOMY      History reviewed. No pertinent family history. Social History:  reports that he has quit smoking. He has never used smokeless tobacco. He reports that he does not drink alcohol or use drugs.  Allergies: No Known Allergies  Medications:                                                                                                                        I reviewed home medications.Takes ASA.      ROS:  14 systems reviewed and negative except above.   Examination:                                                                                                      General: Appears well-developed and well-nourished.  Psych: Affect appropriate to  situation Eyes: No scleral injection HENT: No OP obstrucion Head: Normocephalic.  Cardiovascular: Normal rate and regular rhythm.  Respiratory: Effort normal and breath sounds normal to anterior ascultation GI: Soft.  No distension. There is no tenderness.  Skin: WDI    Neurological Examination Mental Status: Alert, oriented, thought content appropriate.  Speech fluent without evidence of aphasia. Able to follow 3 step commands without difficulty. Cranial Nerves: II: Visual fields grossly normal,  III,IV, VI: ptosis not present, extra-ocular motions intact bilaterally, pupils equal, round, reactive to light and accommodation V,VII: smile symmetric, facial light touch sensation normal bilaterally VIII: hearing normal bilaterally IX,X: uvula rises symmetrically XI: bilateral shoulder shrug XII: midline tongue extension Motor: Right : Upper extremity   5/5    Left:     Upper extremity   5/5  Lower extremity   5/5     Lower extremity   5/5 Tone and bulk:normal tone throughout; no atrophy noted Sensory: Pinprick and light touch intact throughout, bilaterally Deep Tendon Reflexes: 2+ and symmetric throughout Plantars: Right: downgoing   Left: downgoing Cerebellar: normal finger-to-nose, normal rapid alternating movements and normal heel-to-shin test Gait: normal gait and station     Lab Results: Basic Metabolic Panel: Recent Labs  Lab 12/17/17 2058  NA 140  K 3.5  CL 104  CO2 25  GLUCOSE 149*  BUN 20  CREATININE 0.98  CALCIUM 9.4    CBC: Recent Labs  Lab 12/17/17 2058  WBC 7.4  NEUTROABS 3.4  HGB 15.0  HCT 41.2  MCV 90.2  PLT 236    Coagulation Studies: Recent Labs    12/17/17 08/27/2056  LABPROT 12.1  INR 0.90    Imaging: Ct Angio Head W Or Wo Contrast  Result Date: 12/17/2017 CLINICAL DATA:  Thunderclap headache with right-sided weakness. EXAM: CT ANGIOGRAPHY HEAD AND NECK TECHNIQUE: Multidetector CT imaging of the head and neck was performed using the  standard protocol during bolus administration of intravenous contrast. Multiplanar CT image reconstructions and MIPs were obtained to evaluate the vascular anatomy. Carotid stenosis measurements (when applicable) are obtained utilizing NASCET criteria, using the distal internal carotid diameter as the denominator. CONTRAST:  163mL ISOVUE-370 IOPAMIDOL (ISOVUE-370) INJECTION 76% COMPARISON:  Head CT 12/17/2017 CTA head neck 05/08/2017 FINDINGS: CTA NECK FINDINGS AORTIC ARCH: There is no calcific atherosclerosis of the aortic arch. There is no aneurysm, dissection or hemodynamically significant stenosis of the visualized ascending aorta and aortic arch. Conventional 3 vessel aortic branching pattern. The visualized proximal subclavian arteries are widely patent. RIGHT CAROTID SYSTEM: --Common carotid artery: Widely patent origin without common carotid artery dissection or aneurysm. --Internal carotid artery: No dissection, occlusion or aneurysm. No hemodynamically significant stenosis. --External carotid artery: No acute abnormality. LEFT CAROTID SYSTEM: --Common carotid artery: Widely patent origin without common carotid artery  dissection or aneurysm. --Internal carotid artery:No dissection, occlusion or aneurysm. No hemodynamically significant stenosis. --External carotid artery: No acute abnormality. VERTEBRAL ARTERIES: Codominant configuration. Both origins are normal. No dissection, occlusion or flow-limiting stenosis to the vertebrobasilar confluence. SKELETON: There is no bony spinal canal stenosis. No lytic or blastic lesion. OTHER NECK: Normal pharynx, larynx and major salivary glands. No cervical lymphadenopathy. Unremarkable thyroid gland. UPPER CHEST: No pneumothorax or pleural effusion. No nodules or masses. CTA HEAD FINDINGS ANTERIOR CIRCULATION: --Intracranial internal carotid arteries: Normal. --Anterior cerebral arteries: Normal. Both A1 segments are present. Patent anterior communicating artery.  --Middle cerebral arteries: Normal. --Posterior communicating arteries: Absent bilaterally. POSTERIOR CIRCULATION: --Basilar artery: Normal. --Posterior cerebral arteries: Normal. --Superior cerebellar arteries: Normal. --Inferior cerebellar arteries: Normal anterior and posterior inferior cerebellar arteries. VENOUS SINUSES: Normal variant diminutive left transverse sinus. ANATOMIC VARIANTS: None DELAYED PHASE: No parenchymal contrast enhancement. Review of the MIP images confirms the above findings. IMPRESSION: Normal CTA of the head and neck. Electronically Signed   By: Ulyses Jarred M.D.   On: 12/17/2017 21:50   Ct Head Wo Contrast  Result Date: 12/17/2017 CLINICAL DATA:  RIGHT-sided weakness for 1 hour. Gait instability. History of diabetes and stroke. EXAM: CT HEAD WITHOUT CONTRAST TECHNIQUE: Contiguous axial images were obtained from the base of the skull through the vertex without intravenous contrast. COMPARISON:  MRI head May 08, 2017 FINDINGS: BRAIN: No intraparenchymal hemorrhage, mass effect nor midline shift. The ventricles and sulci are normal. No acute large vascular territory infarcts. No abnormal extra-axial fluid collections. Basal cisterns are patent. Mild cerebellar tonsillar ectopia VASCULAR: Unremarkable. SKULL/SOFT TISSUES: No skull fracture. No significant soft tissue swelling. ORBITS/SINUSES: The included ocular globes and orbital contents are normal.Trace paranasal sinus mucosal thickening. Mastoid air cells are well aerated. OTHER: None. ASPECTS [facility] Stroke Program Early CT Score) - Ganglionic level infarction (caudate, lentiform nuclei, internal capsule, insula, M1-M3 cortex): 7 - Supraganglionic infarction (M4-M6 cortex): 3 Total score (0-10 with 10 being normal): 10 IMPRESSION: 1. No acute intracranial process. 2. Stable mild cerebellar tonsillar ectopia, incompletely imaged. 3. ASPECTS is 10. 4. Critical Value/emergent results were called by telephone at the time of  interpretation on 12/17/2017 at 9:01 pm to Dr. Nanda Quinton , who verbally acknowledged these results. Electronically Signed   By: Elon Alas M.D.   On: 12/17/2017 21:02   Ct Angio Neck W And/or Wo Contrast  Result Date: 12/17/2017 CLINICAL DATA:  Thunderclap headache with right-sided weakness. EXAM: CT ANGIOGRAPHY HEAD AND NECK TECHNIQUE: Multidetector CT imaging of the head and neck was performed using the standard protocol during bolus administration of intravenous contrast. Multiplanar CT image reconstructions and MIPs were obtained to evaluate the vascular anatomy. Carotid stenosis measurements (when applicable) are obtained utilizing NASCET criteria, using the distal internal carotid diameter as the denominator. CONTRAST:  118mL ISOVUE-370 IOPAMIDOL (ISOVUE-370) INJECTION 76% COMPARISON:  Head CT 12/17/2017 CTA head neck 05/08/2017 FINDINGS: CTA NECK FINDINGS AORTIC ARCH: There is no calcific atherosclerosis of the aortic arch. There is no aneurysm, dissection or hemodynamically significant stenosis of the visualized ascending aorta and aortic arch. Conventional 3 vessel aortic branching pattern. The visualized proximal subclavian arteries are widely patent. RIGHT CAROTID SYSTEM: --Common carotid artery: Widely patent origin without common carotid artery dissection or aneurysm. --Internal carotid artery: No dissection, occlusion or aneurysm. No hemodynamically significant stenosis. --External carotid artery: No acute abnormality. LEFT CAROTID SYSTEM: --Common carotid artery: Widely patent origin without common carotid artery dissection or aneurysm. --Internal carotid artery:No dissection, occlusion or aneurysm.  No hemodynamically significant stenosis. --External carotid artery: No acute abnormality. VERTEBRAL ARTERIES: Codominant configuration. Both origins are normal. No dissection, occlusion or flow-limiting stenosis to the vertebrobasilar confluence. SKELETON: There is no bony spinal canal stenosis.  No lytic or blastic lesion. OTHER NECK: Normal pharynx, larynx and major salivary glands. No cervical lymphadenopathy. Unremarkable thyroid gland. UPPER CHEST: No pneumothorax or pleural effusion. No nodules or masses. CTA HEAD FINDINGS ANTERIOR CIRCULATION: --Intracranial internal carotid arteries: Normal. --Anterior cerebral arteries: Normal. Both A1 segments are present. Patent anterior communicating artery. --Middle cerebral arteries: Normal. --Posterior communicating arteries: Absent bilaterally. POSTERIOR CIRCULATION: --Basilar artery: Normal. --Posterior cerebral arteries: Normal. --Superior cerebellar arteries: Normal. --Inferior cerebellar arteries: Normal anterior and posterior inferior cerebellar arteries. VENOUS SINUSES: Normal variant diminutive left transverse sinus. ANATOMIC VARIANTS: None DELAYED PHASE: No parenchymal contrast enhancement. Review of the MIP images confirms the above findings. IMPRESSION: Normal CTA of the head and neck. Electronically Signed   By: Ulyses Jarred M.D.   On: 12/17/2017 21:50   Mr Brain Wo Contrast  Result Date: 12/18/2017 CLINICAL DATA:  Headache for 3 days. Right-sided weakness and facial numbness. EXAM: MRI HEAD WITHOUT CONTRAST TECHNIQUE: Multiplanar, multiecho pulse sequences of the brain and surrounding structures were obtained without intravenous contrast. COMPARISON:  CTA head neck 12/17/2017 FINDINGS: BRAIN: There is no acute infarct, acute hemorrhage or mass effect. The midline structures are normal. There are no old infarcts. The white matter signal is normal for the patient's age. The CSF spaces are normal for age, with no hydrocephalus. Susceptibility-sensitive sequences show no chronic microhemorrhage or superficial siderosis. VASCULAR: Major intracranial arterial and venous sinus flow voids are preserved. SKULL AND UPPER CERVICAL SPINE: The visualized skull base, calvarium, upper cervical spine and extracranial soft tissues are normal. SINUSES/ORBITS:  No fluid levels or advanced mucosal thickening. No mastoid or middle ear effusion. The orbits are normal. IMPRESSION: Normal MRI of the brain. Electronically Signed   By: Ulyses Jarred M.D.   On: 12/18/2017 02:38     ASSESSMENT AND PLAN  42 year old male with history of hypertension, diabetes, and similar strokelike symptoms presentation in December 2018. He  presents with sudden onset dysarthria, right-sided numbness and weakness around 3 PM on Wednesday afternoon with 3-day history of continuous headache.  Blood pressure 159/77 when he arrived to outside hospital ED.  Was evaluated by tele-stroke neurology who recommended admission for inpatient stroke work-up.  He was transferred for MRI brain, however symptoms resolved before he received his scan.  MRI brain was negative for acute findings. His presentation could be due to complicated migraine-as his headache was continuous for 3 days and symptoms started fairly abruptly this atypical. He does have significant vascular risk factors despite his young age and this could represent a TIA as his symptoms improved before obtaining MRI brain.  ABCD 2 score is 5.  Recommend completion of TIA work-up and observation.   TIA vs Complicated Migraine   Risk factors:HTN, HLD, DM Etiology: small vessel disease  Recommend #Transthoracic Echo  # ASA/Plavix for 3 weeks  #Start or continue Atorvastatin 80 mg/other high intensity statin # BP goal: permissive HTN upto 220/120 mmHg  # HBAIC and Lipid profile # Telemetry monitoring # Frequent neuro checks #  stroke swallow screen # Consider restarting Topamax for migraine.   Please page stroke NP  Or  PA  Or MD from 8am -4 pm  as this patient from this time will be  followed by the stroke.   You can look them up  on [email or web address]  Password [facility]    Iretta Mangrum [facility] Pager Number [phone]

## 2017-12-26 LAB — HM DIABETES EYE EXAM

## 2018-01-06 MED FILL — ATORVASTATIN 80 MG TABLET: 80 | 30 days supply | Qty: 30 | Fill #1

## 2018-01-06 MED FILL — TOPIRAMATE 50 MG TABLET: 50 | 30 days supply | Qty: 30 | Fill #1

## 2018-01-06 MED FILL — ?METFORMIN HCL 500MG TABS: 500 | 30 days supply | Qty: 30 | Fill #1

## 2018-01-19 MED FILL — NORTRIPTYLINE HCL 25 MG CAP: 25 | 30 days supply | Qty: 30 | Fill #1

## 2018-01-19 MED FILL — ?CETIRIZINE HCL 10 MG TABLE: 10 | 30 days supply | Qty: 30 | Fill #4

## 2018-01-19 MED FILL — FLUTICASONE PROP 50 MCG SPR: 50 | 30 days supply | Qty: 16 | Fill #4

## 2018-01-21 ENCOUNTER — Encounter: Payer: Self-pay | Admitting: Neurology

## 2018-01-21 ENCOUNTER — Ambulatory Visit: Payer: Self-pay | Admitting: Neurology

## 2018-01-21 VITALS — BP 114/77 | HR 57 | Ht 69.0 in | Wt 186.0 lb

## 2018-01-21 DIAGNOSIS — G43009 Migraine without aura, not intractable, without status migrainosus: Secondary | ICD-10-CM

## 2018-01-21 NOTE — Progress Notes (Signed)
[facility] [address] [place]. [place] [address] [phone]       OFFICE FOLLOW-UP NOTE  Drew [name] Date of Birth:  08-15-1975 Medical Record Number:  [id number]   HPI: 69 year Hispanic male seen today for initial office follow-up visit following hospital admission for TIA versus computed migraine in August 2019.He is accompanied by his wife and Spanish language interpreter.Drew [name] is an 42 y.o. male with PMH of HTN, HLD, DM with history of strokelike symptoms in 2018 where he had a lower facial droop and left facial numbness presents with sudden onset right side weakness and numbness, slurred speech and left facial numbness around 3 pm. Patient had difficulty with walking and presented to OSH ER. History of occipital headache x 3 days. BP 159/77 on arrival to ED.Was seen by tele neurology who recommended admission . EDP called Dr Lorraine Lax who thought this maybe a complicated migraine and asked for face to face neurological evaluation and transferred to [facility] for MRI Brain. Which was normal. Transthoracic echo showed normal ejection fraction.LDL cholesterol was elevated at 148 mg percent and hemoglobin A1c was 5.5. CT angiogram of the brain and neck both were unremarkable. Patient was started on Topamax for headaches. He states he still has headaches off and on but they're mostly dull and posterior located. He had a severe headache a few weeks ago. His primary physician and started him on nortriptyline 25 mg at night which helps him sleep. He supposed to been Topamax twice daily but he takes it only as needed. Is taking aspirin everyday. Patient states his numbness has resolved. Headache is a lot improved but still persistent. He has no new complaints.   ROS:   14 system review of systems is positive for  Headache, dizziness, tremor, insomnia, snoring, joint pain, muscle cramps and all other systems negative  PMH:  Past Medical History:  Diagnosis Date  . Facial nerve  palsy   . Stroke ([place])   . Type 2 diabetes mellitus ([place])     Social History:  Social History   Socioeconomic History  . Marital status: Married    Spouse name: Not on file  . Number of children: Not on file  . Years of education: Not on file  . Highest education level: Not on file  Occupational History  . Not on file  Social Needs  . Financial resource strain: Not on file  . Food insecurity:    Worry: Not on file    Inability: Not on file  . Transportation needs:    Medical: Not on file    Non-medical: Not on file  Tobacco Use  . Smoking status: Former Research scientist (life sciences)  . Smokeless tobacco: Never Used  Substance and Sexual Activity  . Alcohol use: No    Frequency: Never  . Drug use: No  . Sexual activity: Not on file  Lifestyle  . Physical activity:    Days per week: Not on file    Minutes per session: Not on file  . Stress: Not on file  Relationships  . Social connections:    Talks on phone: Not on file    Gets together: Not on file    Attends religious service: Not on file    Active member of club or organization: Not on file    Attends meetings of clubs or organizations: Not on file    Relationship status: Not on file  . Intimate partner violence:    Fear of current or ex partner:  Not on file    Emotionally abused: Not on file    Physically abused: Not on file    Forced sexual activity: Not on file  Other Topics Concern  . Not on file  Social History Narrative  . Not on file    Medications:   Current Outpatient Medications on File Prior to Visit  Medication Sig Dispense Refill  . aspirin 81 MG EC tablet Take 1 tablet (81 mg total) by mouth daily. 30 tablet 5  . atorvastatin (LIPITOR) 80 MG tablet Take 1 tablet (80 mg total) by mouth daily at 6 PM. 30 tablet 5  . cetirizine (ZYRTEC) 10 MG tablet Take 1 tablet (10 mg total) by mouth daily. 30 tablet 11  . Docusate Calcium (STOOL SOFTENER PO) Take by mouth.    . fluticasone (FLONASE) 50 MCG/ACT nasal spray Place  2 sprays into both nostrils daily. 16 g 6  . meclizine (ANTIVERT) 25 MG tablet Take 1 tablet (25 mg total) by mouth 3 (three) times daily as needed for dizziness. 30 tablet 0  . nortriptyline (PAMELOR) 25 MG capsule Take 1 capsule (25 mg total) by mouth at bedtime. For headache prevention 30 capsule 5  . senna-docusate (SENOKOT-S) 8.6-50 MG tablet Take 2 tablets by mouth at bedtime as needed for mild constipation. 30 tablet 1  . topiramate (TOPAMAX) 50 MG tablet Take 1 tablet (50 mg total) by mouth 2 (two) times daily. For Headache Prevention 30 tablet 2  . metFORMIN (GLUCOPHAGE) 500 MG tablet Take 1 tablet (500 mg total) by mouth daily with breakfast. Restart on 12/21/17 30 tablet 5   No current facility-administered medications on file prior to visit.     Allergies:  No Known Allergies  Physical Exam General: well developed, well nourished, seated, in no evident distress Head: head normocephalic and atraumatic.  Neck: supple with no carotid or supraclavicular bruits Cardiovascular: regular rate and rhythm, no murmurs Musculoskeletal: no deformity Skin:  no rash/petichiae Vascular:  Normal pulses all extremities Vitals:   01/21/18 1028  BP: 114/77  Pulse: (!) 57   Neurologic Exam Mental Status: Awake and fully alert. Oriented to place and time. Recent and remote memory intact. Attention span, concentration and fund of knowledge appropriate. Mood and affect appropriate.  Cranial Nerves: Fundoscopic exam reveals sharp disc margins. Pupils equal, briskly reactive to light. Extraocular movements full without nystagmus. Visual fields full to confrontation. Hearing intact. Facial sensation intact. Face, tongue, palate moves normally and symmetrically.  Motor: Normal bulk and tone. Normal strength in all tested extremity muscles. Sensory.: intact to touch ,pinprick .position and vibratory sensation.  Coordination: Rapid alternating movements normal in all extremities. Finger-to-nose and  heel-to-shin performed accurately bilaterally. Gait and Station: Arises from chair without difficulty. Stance is normal. Gait demonstrates normal stride length and balance . Able to heel, toe and tandem walk without difficulty.  Reflexes: 1+ and symmetric. Toes downgoing.   NIHSS  0 Modified Rankin  1   ASSESSMENT: 42 year old Hispanic male with transient episode of headache with face and body paresthesias likely complicated migraine episode. History of similar episode in the past. 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 I advised him to avoid migraine provocative triggers and to start Topamax 50 mg daily for 1 week to be increased to twice daily if tolerated without side effects for prophylaxis. He'll continue  nortriptyline and the current dose for migraine prophylaxis as well. I advised him to do regular neck stretching exercises as well.He will stay on aspirin for stroke prevention. He will return for follow-up in 3 months with my nurse practitioner or call earlier if necessary Greater than 50% of time during this 25 minute visit was spent on counseling,explanation of diagnosis, planning of further management, discussion with patient and family and coordination of care Antony Contras, MD  [facility] [address] [place], [place] [address]  Phone [phone] Fax [phone] Note: This document was prepared with digital dictation and possible smart phrase technology. Any transcriptional errors that result from this process are unintentional

## 2018-01-21 NOTE — Patient Instructions (Addendum)
I  Had a long discussion with the patient and his wife via Spanish language interpreter about complicated migraine episodes and answered questions. I advised him to avoid migraine provocative triggers and to start Topamax 50 mg daily for 1 week to be increased to twice daily if tolerated without side effects for prophylaxis. He'll continue nortriptyline and the current dose for migraine prophylaxis as well. I advised him to do regular neck stretching exercises as well.He will stay on aspirin for stroke prevention. He will return for follow-up in 3 months with my nurse practitioner or call earlier if necessary Cefalea migraosa (Migraine Headache) Una cefalea migraosa es un dolor intenso y punzante en uno o ambos lados de la cabeza. Las migraas tambin pueden causar otros sntomas, como nuseas, vmitos y sensibilidad a la luz y el ruido. CAUSAS Hay ciertos factores que pueden provocar migraas, como los siguientes:  Alcohol.  Fumar.  Medicamentos, por ejemplo: ? Medicamentos para Best boy torcico (nitroglicerina). ? Pldoras anticonceptivas. ? Comprimidos de estrgeno. ? Ciertos medicamentos para la presin arterial.  Quesos curados, chocolate o cafena.  Los alimentos o las bebidas que contienen nitratos, glutamato, aspartamo o tiramina.  Realizar actividad fsica. Otros factores que pueden provocar migraa incluyen los siguientes:  Menstruacin.  Embarazo.  Hambre.  Estrs, poco o demasiado sueo, o fatiga.  Cambios climticos. Escalon factores pueden hacer que usted sea ms propenso a tener migraas:  La edad. Los riesgos aumentan con la edad.  Antecedentes familiares de migraa.  Ser de Science writer.  Depresin y ansiedad.  Obesidad.  Ser mujer.  Tener un agujero en el corazn (persistencia del agujero oval) u otros problemas cardacos. SNTOMAS El principal sntoma de esta afeccin es el dolor intenso y punzante. El  dolor:  Puede aparecer en cualquier regin de la cabeza, tanto de un lado como de [place].  Puede interferir con las actividades de la vida cotidiana.  Puede empeorar con la actividad fsica.  Puede empeorar ante la exposicin a luces brillantes o a ruidos fuertes. Otros sntomas pueden incluir lo siguiente:  Nuseas.  Vmitos.  Mareos.  Sensibilidad general a las luces brillantes, a los ruidos fuertes o a los [employer]. Antes de sufrir una migraa, puede percibir seales de advertencia (aura). Un aura puede incluir:  Ver luces intermitentes o tener puntos ciegos.  Ver puntos brillantes, halos o lneas en zigzag.  Tener una visin en tnel o visin borrosa.  Sentir entumecimiento u hormigueo.  Tener dificultad para hablar.  Debilidad muscular. DIAGNSTICO La cefalea migraosa se diagnostica en funcin de lo siguiente:  Sus sntomas.  Un examen fsico.  Estudios, como una tomografa computarizada o una resonancia magntica de la cabeza. Estos estudios de diagnstico por imgenes pueden ayudar a Freight forwarder causas de cefalea.  Truddie Coco de lquido cefalorraqudeo (puncin lumbar) para Physiological scientist (anlisis de lquido cefalorraqudeo o anlisis de LCR). TRATAMIENTO Las cefaleas migraosas suelen tratarse con medicamentos que:  Financial trader.  [place] nuseas.  Evitan la recurrencia de las migraas. El tratamiento tambin puede incluir lo siguiente:  Acupuntura.  Cambios en el estilo de vida, como evitar alimentos que provoquen [place] los medicamentos de venta libre y los recetados solamente como se lo haya indicado el mdico.  No conduzca ni use maquinaria pesada mientras toma analgsicos recetados.  A fin de prevenir o tratar el estreimiento mientras toma analgsicos recetados, el mdico puede recomendarle lo siguiente: ? Beba suficiente  lquido para Consulting civil engineer orina clara o de color  amarillo plido. ? Tomar medicamentos recetados o de [employer]. ? Consumir alimentos ricos en fibra, como frutas y verduras frescas, cereales integrales y frijoles. ? Limitar el consumo de alimentos con alto contenido de grasas y azcares procesados, como alimentos fritos o dulces. Estilo de vida  Evite el consumo de alcohol.  No consuma ningn producto que contenga nicotina o tabaco, como cigarrillos y Psychologist, sport and exercise. Si necesita ayuda para dejar de fumar, consulte al mdico.  Duerma como mnimo 8horas todas las noches.  Evite las situaciones de estrs. Instrucciones generales  Lleve un registro diario para Neurosurgeon lo que Financial trader. Por ejemplo, escriba: ? Lo que usted come y bebe. ? Cunto tiempo duerme. ? Algn cambio en su dieta o en los medicamentos.  Si tiene una migraa: ? Evite los factores que [employer] sntomas, como las luces brillantes. ? Resulta til acostarse en una habitacin oscura y silenciosa. ? No conduzca vehculos ni opere maquinaria pesada. ? Pregntele al mdico qu actividades son seguras para usted cuando tiene sntomas.  Concurra a todas las visitas de control como se lo haya indicado el mdico. Esto es importante. SOLICITE ATENCIN MDICA SI:  Tiene sntomas de migraa distintos o ms intensos que los habituales.  SOLICITE ATENCIN MDICA DE INMEDIATO SI:  La migraa se hace cada vez ms intensa.  Tiene fiebre.  Presenta rigidez en el cuello.  Tiene prdida de visin.  Siente debilidad en los msculos o que no puede controlarlos.  Comienza a perder el equilibrio con frecuencia.  Comienza a tener dificultades para caminar.  Se desmaya.  Esta informacin no tiene Marine scientist el consejo del mdico. Asegrese de hacerle al mdico cualquier pregunta que tenga. Document Released: 04/29/2005 Document Revised: 02/17/2013 Document Reviewed: 10/16/2015 Elsevier Interactive Patient Education   2017 [employer].  had a long discussion with the patient, his wife using a Spanish language interpreter about his episode of complicated   migraine and answered questions. I recommend he take Topamax 50 mg daily for 1 week to be increased to twice daily if tolerated without side effects migraine prophylaxis. He was l asked to  Avoid migraine provocative triggers. I advised him to do regular neck stretching exercises as well. Continue aspirin for stroke prevention. He will also stay on nortriptyline and for migraine prevention at night. He will return for follow-up in the future in 3 months with my nurse practitioner or call earlier if necessary.

## 2018-02-19 MED FILL — TOPIRAMATE 50 MG TABLET: 50 | 30 days supply | Qty: 30 | Fill #2

## 2018-02-19 MED FILL — NORTRIPTYLINE HCL 25 MG CAP: 25 | 30 days supply | Qty: 30 | Fill #2

## 2018-02-19 MED FILL — metFORMIN HCL 500 MG TABS: 500 | 30 days supply | Qty: 30 | Fill #2

## 2018-02-19 MED FILL — FLUTICASONE PROP 50 MCG SPR: 50 | 30 days supply | Qty: 16 | Fill #5

## 2018-02-19 MED FILL — ATORVASTATIN 80 MG TABLET: 80 | 30 days supply | Qty: 30 | Fill #2

## 2018-03-05 ENCOUNTER — Telehealth: Payer: Self-pay

## 2018-03-05 NOTE — Telephone Encounter (Signed)
Patient will be at appointment tomorrow at [facility].

## 2018-03-06 ENCOUNTER — Encounter: Payer: Self-pay | Admitting: Family Medicine

## 2018-03-06 ENCOUNTER — Ambulatory Visit (INDEPENDENT_AMBULATORY_CARE_PROVIDER_SITE_OTHER): Payer: Self-pay | Admitting: Family Medicine

## 2018-03-06 VITALS — BP 119/88 | HR 50 | Temp 97.9°F | Resp 16 | Ht 69.0 in | Wt 184.0 lb

## 2018-03-06 DIAGNOSIS — Z789 Other specified health status: Secondary | ICD-10-CM

## 2018-03-06 DIAGNOSIS — Z23 Encounter for immunization: Secondary | ICD-10-CM

## 2018-03-06 DIAGNOSIS — E119 Type 2 diabetes mellitus without complications: Secondary | ICD-10-CM

## 2018-03-06 DIAGNOSIS — E785 Hyperlipidemia, unspecified: Secondary | ICD-10-CM

## 2018-03-06 LAB — POCT URINALYSIS DIPSTICK
Bilirubin, UA: NEGATIVE
Blood, UA: NEGATIVE
Glucose, UA: NEGATIVE
Ketones, UA: NEGATIVE
Leukocytes, UA: NEGATIVE
Nitrite, UA: NEGATIVE
Protein, UA: NEGATIVE
Spec Grav, UA: 1.025 (ref 1.010–1.025)
Urobilinogen, UA: 0.2 E.U./dL
pH, UA: 5 (ref 5.0–8.0)

## 2018-03-06 LAB — POCT GLYCOSYLATED HEMOGLOBIN (HGB A1C): Hemoglobin A1C: 5.4 % (ref 4.0–5.6)

## 2018-03-06 MED ORDER — ATORVASTATIN CALCIUM 80 MG PO TABS: 80.0000 mg | ORAL_TABLET | Freq: Every day | ORAL | 5 refills | Status: DC

## 2018-03-06 MED ORDER — METFORMIN HCL 500 MG PO TABS: 250.0000 mg | ORAL_TABLET | Freq: Every day | ORAL | 2 refills | Status: DC

## 2018-03-06 NOTE — Patient Instructions (Signed)
I have decreased the metformin for the diabetes to 1/2 a pill everyday. I want you to restart the cholesterol medication.   Colesterol Cholesterol El colesterol es grasa. El organismo necesita una pequea cantidad de colesterol. El colesterol (placa) puede acumularse en los vasos sanguneos (arterias). Esto lo hace ms propenso a sufrir infartos de miocardio o accidentes cerebrovasculares. No puede sentir el nivel de colesterol. La nica forma de saber si el nivel es alto es mediante un anlisis de [place]. Guarde los [employer]. Trabaje con el mdico para Investment banker, corporate en un nivel adecuado. [place] significan los resultados?  El colesterol total es la cantidad de colesterol que hay en la [place].  El LDL es el colesterol [place]. Este tipo es el que puede acumularse. Intente mantener el nivel de LDL bajo.  El HDL es el colesterol bueno. Limpia los vasos sanguneos y elimina el colesterol LDL. Intente mantener el nivel de HDL alto.  Los triglicridos son grasas que el organismo puede Financial controller o quemar como fuente de Teacher, early years/pre. Cules son los niveles buenos de colesterol?  El colesterol total debe estar por debajo de 200.  Lo ms aconsejable para quienes tienen riesgos para la salud es mantener el nivel de LDL por debajo de 100. Lo ms aconsejable para quienes tienen muchos riesgos para la salud es mantener el nivel de LDL por debajo de 70.  Se aconseja mantener un nivel de HDL es por encima de 40. Pero lo mejor es tener un nivel de HDL de 60 o superior.  Los triglicridos por debajo de 150. Cmo puedo bajar el colesterol? Dieta Siga el programa de alimentacin que el mdico le indique.  Elija pescados o carne blanca de pollo y pavo asados u horneados. Intente no comer carne roja, alimentos fritos, salchichas ni embutidos.  Coma gran cantidad de frutas y verduras frescas.  Elija los cereales integrales, los frijoles, las pastas, las papas y los cereales.  Elija aceite  de oliva, aceite de maz o aceite de canola. Solo use poca cantidad.  Intente no comer [place], [place], [place] o aceites de [place].  Intente no comer alimentos que contengan grasas trans.  Elija alimentos lcteos descremados o sin grasa. ? Beba leche descremada o sin grasa. ? Coma yogur y quesos descremados o sin grasa. ? Intente no consumir leche entera o crema. ? Intente no comer helados, yemas de huevo ni quesos enteros.  Los postres sanos incluyen la torta ngel, los bocadillos de [place], las Administrator con forma de [place], los caramelos duros, los helados de agua y el yogur descremado o sin grasa. Intente no comer masas, tortas, pasteles y galletas.  La prctica de actividad fsica. Siga el programa de actividad fsica que le indique el mdico.  Sea ms [place]. Intente hacer los trabajos de Guys, salir a Writer y usar las escaleras.  Consulte al mdico cmo puede aumentar su actividad.  Medicamentos  [employer] de venta libre y los recetados solamente como se lo haya indicado el mdico.  Esta informacin no tiene Marine scientist el consejo del mdico. Asegrese de hacerle al mdico cualquier pregunta que tenga. Document Released: 06/01/2010 Document Revised: 07/29/2016 Document Reviewed: 11/09/2015 Elsevier Interactive Patient Education  [employer].

## 2018-03-06 NOTE — Progress Notes (Signed)
Patient [facility]   Progress Note: General Provider: Lanae Boast, FNP  SUBJECTIVE:   Drew [name] is a 42 y.o. male who  has a past medical history of Facial nerve palsy, Stroke ([place]), and Type 2 diabetes mellitus ([place]).. Patient presents today for Diabetes Patient presents for follow-up on diabetes.  Since the last visit, he was seen in the emergency department due to severe headache.  Patient had MRI which was negative and referred to neurology for further evaluation.  Patient advised to take his Topamax twice a day at 50 mg and not as needed.  Patient reports compliance with this. Patient states that he was instructed to stop the Lipitor 80 mg back in May.  He has not taken it since then.  Cholesterol and triglycerides have increased.  He does not follow a low carbohydrate diet or exercise.  Patient denies chest pain shortness of breath dizziness or leg swelling. Spanish interpreter used throughout this visit. Review of Systems  Constitutional: Negative.   HENT: Negative.   Eyes: Negative.   Respiratory: Negative.   Cardiovascular: Negative.   Gastrointestinal: Negative.   Genitourinary: Negative.   Musculoskeletal: Negative.   Skin: Negative.   Neurological: Negative.   Psychiatric/Behavioral: Negative.      OBJECTIVE: BP 119/88 (BP Location: Left Arm, Patient Position: Sitting, Cuff Size: Normal)   Pulse (!) 50   Temp 97.9 F (36.6 C) (Oral)   Resp 16   Ht 5\' 9"  (1.753 m)   Wt 184 lb (83.5 kg)   SpO2 100%   BMI 27.17 kg/m   Physical Exam  Constitutional: He is oriented to person, place, and time. He appears well-developed and well-nourished. No distress.  HENT:  Head: Normocephalic and atraumatic.  Eyes: Pupils are equal, round, and reactive to light. Conjunctivae and EOM are normal.  Neck: Normal range of motion.  Cardiovascular: Normal rate, regular rhythm, normal heart sounds and intact distal pulses.  Pulmonary/Chest:  Effort normal and breath sounds normal. No respiratory distress.  Abdominal: Soft. Bowel sounds are normal. He exhibits no distension.  Musculoskeletal: Normal range of motion.  Neurological: He is alert and oriented to person, place, and time.  Skin: Skin is warm and dry.  Psychiatric: He has a normal mood and affect. His behavior is normal. Thought content normal.  Nursing note and vitals reviewed.   ASSESSMENT/PLAN: 1. Diabetes mellitus without complication ([place]) We will decrease medication to 250 mg of metformin every morning with breakfast.  Patient states that he will exercise and watch his diet over the next few months because he wants to stop taking all medication.  A1c was at goal today at 5.3. - Urinalysis Dipstick - HgB A1c - Comprehensive metabolic panel - metFORMIN (GLUCOPHAGE) 500 MG tablet; Take 0.5 tablets (250 mg total) by mouth daily with breakfast. Restart on 12/21/17  Dispense: 45 tablet; Refill: 2  2. Hyperlipidemia LDL goal <100 Patient advised to restart Lipitor 80 mg daily.  We will repeat labs in 6 months. - atorvastatin (LIPITOR) 80 MG tablet; Take 1 tablet (80 mg total) by mouth daily at 6 PM.  Dispense: 30 tablet; Refill: 5 - Lipid panel  4. Flu vaccine need - Flu Vaccine QUAD 6+ mos PF IM (Fluarix Quad PF)  Non-English-speaking patient Spanish interpreter used throughout the examination.     The patient was given clear instructions to go to ER or return to medical center if symptoms do not improve, worsen or new problems develop. The  patient verbalized understanding and agreed with plan of care.   Ms. Doug Sou. Nathaneil Canary, FNP-BC Patient [facility] [address] [place], [place] [address] [phone]     This note has been created with Dragon speech recognition software and smart phrase technology. Any transcriptional errors are unintentional.

## 2018-03-07 LAB — COMPREHENSIVE METABOLIC PANEL
ALT: 27 IU/L (ref 0–44)
AST: 31 IU/L (ref 0–40)
Albumin/Globulin Ratio: 1.9 (ref 1.2–2.2)
Albumin: 4.5 g/dL (ref 3.5–5.5)
Alkaline Phosphatase: 91 IU/L (ref 39–117)
BUN/Creatinine Ratio: 18 (ref 9–20)
BUN: 15 mg/dL (ref 6–24)
Bilirubin Total: 0.4 mg/dL (ref 0.0–1.2)
CO2: 20 mmol/L (ref 20–29)
Calcium: 9 mg/dL (ref 8.7–10.2)
Chloride: 105 mmol/L (ref 96–106)
Creatinine, Ser: 0.83 mg/dL (ref 0.76–1.27)
GFR calc Af Amer: 126 mL/min/{1.73_m2} (ref >59)
GFR calc non Af Amer: 109 mL/min/{1.73_m2} (ref >59)
Globulin, Total: 2.4 g/dL (ref 1.5–4.5)
Glucose: 120 mg/dL — ABNORMAL HIGH (ref 65–99)
Potassium: 4.4 mmol/L (ref 3.5–5.2)
Sodium: 141 mmol/L (ref 134–144)
Total Protein: 6.9 g/dL (ref 6.0–8.5)

## 2018-03-07 LAB — LIPID PANEL
Chol/HDL Ratio: 3.9 ratio (ref 0.0–5.0)
Cholesterol, Total: 168 mg/dL (ref 100–199)
HDL: 43 mg/dL (ref >39)
LDL Calculated: 109 mg/dL — ABNORMAL HIGH (ref 0–99)
Triglycerides: 79 mg/dL (ref 0–149)
VLDL Cholesterol Cal: 16 mg/dL (ref 5–40)

## 2018-03-26 MED FILL — ATORVASTATIN 80 MG TABLET: 80 | 30 days supply | Qty: 30 | Fill #3

## 2018-03-26 MED FILL — NORTRIPTYLINE HCL 25 MG CAP: 25 | 30 days supply | Qty: 30 | Fill #3

## 2018-03-26 MED FILL — metFORMIN HCL 500 MG TABS: 500 | 30 days supply | Qty: 15 | Fill #0

## 2018-03-26 MED FILL — FLUTICASONE PROP 50 MCG SPR: 50 | 30 days supply | Qty: 16 | Fill #6

## 2018-04-17 ENCOUNTER — Telehealth: Payer: Self-pay | Admitting: Family Medicine

## 2018-04-17 NOTE — Telephone Encounter (Signed)
Was advice what he need to get the financial assitance

## 2018-04-24 ENCOUNTER — Ambulatory Visit (INDEPENDENT_AMBULATORY_CARE_PROVIDER_SITE_OTHER): Payer: Self-pay | Admitting: Adult Health

## 2018-04-24 ENCOUNTER — Encounter: Payer: Self-pay | Admitting: Adult Health

## 2018-04-24 VITALS — BP 122/74 | HR 47 | Ht 69.0 in | Wt 191.6 lb

## 2018-04-24 DIAGNOSIS — E785 Hyperlipidemia, unspecified: Secondary | ICD-10-CM

## 2018-04-24 DIAGNOSIS — G44229 Chronic tension-type headache, not intractable: Secondary | ICD-10-CM

## 2018-04-24 DIAGNOSIS — E119 Type 2 diabetes mellitus without complications: Secondary | ICD-10-CM

## 2018-04-24 DIAGNOSIS — G459 Transient cerebral ischemic attack, unspecified: Secondary | ICD-10-CM

## 2018-04-24 NOTE — Patient Instructions (Addendum)
Continue aspirin 81 mg daily  and lipitor  for secondary stroke prevention  Continue to follow up with PCP regarding cholesterol, diabetes and blood pressure management/monitoring    Continue to stay active and maintain a healthy diet  Continue to monitor blood pressure at home  Maintain strict control of hypertension with blood pressure goal below 130/90, diabetes with hemoglobin A1c goal below 6.5% and cholesterol with LDL cholesterol (bad cholesterol) goal below 70 mg/dL. I also advised the patient to eat a healthy diet with plenty of whole grains, cereals, fruits and vegetables, exercise regularly and maintain ideal body weight.  Followup in the future with me in 6 months or call earlier if needed       Thank you for coming to see [place] at [facility]. I hope we have been able to provide you high quality care today.  You may receive a patient satisfaction survey over the next few weeks. We would appreciate your feedback and comments so that we may continue to improve ourselves and the health of our patients.

## 2018-04-24 NOTE — Progress Notes (Signed)
[facility] [address] [place]. [place] [address] [phone]       OFFICE FOLLOW-UP NOTE  [name] Date of Birth:  Jun 22, 1975 Medical Record Number:  [id number]   HPI initial visit 01/21/2018 PS: 30 year Hispanic male seen today for initial office follow-up visit following hospital admission for TIA versus computed migraine in August 2019.He is accompanied by his wife and Spanish language interpreter.Drew [name] is an 42 y.o. male with PMH of HTN, HLD, DM with history of strokelike symptoms in 2018 where he had a lower facial droop and left facial numbness presents with sudden onset right side weakness and numbness, slurred speech and left facial numbness around 3 pm. Patient had difficulty with walking and presented to OSH ER. History of occipital headache x 3 days. BP 159/77 on arrival to ED.Was seen by tele neurology who recommended admission . EDP called Dr Lorraine Lax who thought this maybe a complicated migraine and asked for face to face neurological evaluation and transferred to [facility] for MRI Brain. Which was normal. Transthoracic echo showed normal ejection fraction.LDL cholesterol was elevated at 148 mg percent and hemoglobin A1c was 5.5. CT angiogram of the brain and neck both were unremarkable. Patient was started on Topamax for headaches. He states he still has headaches off and on but they're mostly dull and posterior located. He had a severe headache a few weeks ago. His primary physician and started him on nortriptyline 25 mg at night which helps him sleep. He supposed to been Topamax twice daily but he takes it only as needed. Is taking aspirin everyday. Patient states his numbness has resolved. Headache is a lot improved but still persistent. He has no new complaints.  Interval history 04/24/2018: Patient returns today for follow-up visit for migraine management and is accompanied by wife and interpreter as patient is spanish speaking. He states he has been doing  well. He states headaches have been much less occurring approx every 3 weeks or so. When he does have his headaches he states he will have the headache for about a day but much less in severity. He has stopped taking nortriptyline and topamax about 1 month ago and denies worsening. He stopped these medications as he was doing better and attempting to decrease his medications. He does continue to take aspirin 81mg  without side effects of bleeding or bruising. He continues to take lipitor without side effects of myalgias. Blood pressure today satisfactory at 122/74. He does monitor at home at this is his normal level.  He has returned back to all previous activities without complications.  No further concerns at this time.  Denies new or worsening stroke/TIA symptoms.   ROS:   14 system review of systems is positive for no concerns and all other systems negative  PMH:  Past Medical History:  Diagnosis Date  . Facial nerve palsy   . Stroke ([place])   . Type 2 diabetes mellitus ([place])     Social History:  Social History   Socioeconomic History  . Marital status: Married    Spouse name: Not on file  . Number of children: Not on file  . Years of education: Not on file  . Highest education level: Not on file  Occupational History  . Not on file  Social Needs  . Financial resource strain: Not on file  . Food insecurity:    Worry: Not on file    Inability: Not on file  . Transportation needs:    Medical: Not  on file    Non-medical: Not on file  Tobacco Use  . Smoking status: Former Research scientist (life sciences)  . Smokeless tobacco: Never Used  Substance and Sexual Activity  . Alcohol use: No    Frequency: Never  . Drug use: No  . Sexual activity: Not on file  Lifestyle  . Physical activity:    Days per week: Not on file    Minutes per session: Not on file  . Stress: Not on file  Relationships  . Social connections:    Talks on phone: Not on file    Gets together: Not on file    Attends religious  service: Not on file    Active member of club or organization: Not on file    Attends meetings of clubs or organizations: Not on file    Relationship status: Not on file  . Intimate partner violence:    Fear of current or ex partner: Not on file    Emotionally abused: Not on file    Physically abused: Not on file    Forced sexual activity: Not on file  Other Topics Concern  . Not on file  Social History Narrative  . Not on file    Medications:   Current Outpatient Medications on File Prior to Visit  Medication Sig Dispense Refill  . aspirin 81 MG EC tablet Take 1 tablet (81 mg total) by mouth daily. 30 tablet 5  . atorvastatin (LIPITOR) 80 MG tablet Take 1 tablet (80 mg total) by mouth daily at 6 PM. 30 tablet 5  . Docusate Calcium (STOOL SOFTENER PO) Take by mouth.    . cetirizine (ZYRTEC) 10 MG tablet Take 1 tablet (10 mg total) by mouth daily. (Patient not taking: Reported on 04/24/2018) 30 tablet 11  . fluticasone (FLONASE) 50 MCG/ACT nasal spray Place 2 sprays into both nostrils daily. (Patient not taking: Reported on 04/24/2018) 16 g 6  . metFORMIN (GLUCOPHAGE) 500 MG tablet Take 0.5 tablets (250 mg total) by mouth daily with breakfast. Restart on 12/21/17 45 tablet 2  . senna-docusate (SENOKOT-S) 8.6-50 MG tablet Take 2 tablets by mouth at bedtime as needed for mild constipation. (Patient not taking: Reported on 04/24/2018) 30 tablet 1   No current facility-administered medications on file prior to visit.     Allergies:  No Known Allergies  Physical Exam General: well developed, well nourished, pleasant Hispanic middle-aged male, seated, in no evident distress Head: head normocephalic and atraumatic.  Neck: supple with no carotid or supraclavicular bruits Cardiovascular: regular rate and rhythm, no murmurs Musculoskeletal: no deformity Skin:  no rash/petichiae Vascular:  Normal pulses all extremities Vitals:   04/24/18 0915  BP: 122/74  Pulse: (!) 47   Neurologic  Exam Mental Status: Awake and fully alert. Oriented to place and time. Recent and remote memory intact. Attention span, concentration and fund of knowledge appropriate. Mood and affect appropriate.  Cranial Nerves: Fundoscopic exam reveals sharp disc margins. Pupils equal, briskly reactive to light. Extraocular movements full without nystagmus. Visual fields full to confrontation. Hearing intact. Facial sensation intact. Face, tongue, palate moves normally and symmetrically.  Motor: Normal bulk and tone. Normal strength in all tested extremity muscles. Sensory.: intact to touch ,pinprick .position and vibratory sensation.  Coordination: Rapid alternating movements normal in all extremities. Finger-to-nose and heel-to-shin performed accurately bilaterally. Gait and Station: Arises from chair without difficulty. Stance is normal. Gait demonstrates normal stride length and balance . Able to heel, toe and tandem walk without difficulty.  Reflexes: 1+  and symmetric. Toes downgoing.      ASSESSMENT: 42 year old Hispanic male with transient episode of headache with face and body paresthesias likely complicated migraine episode. History of similar episode in the past.  Patient is being seen today for 38-month follow-up and overall has been doing well with only occasional headaches.  He has stopped use of nortriptyline and Topamax on his own as he was doing better and has not had any worsening in symptoms.    PLAN: -Continue aspirin 81 mg and atorvastatin 80 mg for secondary stroke prevention -f/u with PCP for HLD, HTN and DM management -Advised patient to continue to stay active and maintain a healthy diet -Continue to monitor blood pressure at home -Maintain strict control of hypertension with blood pressure goal below 130/90, diabetes with hemoglobin A1c goal below 6.5% and cholesterol with LDL cholesterol (bad cholesterol) goal below 70 mg/dL. I also advised the patient to eat a healthy diet with  plenty of whole grains, cereals, fruits and vegetables, exercise regularly and maintain ideal body weight.  Follow-up in 6 months or call earlier if needed.  Patient was advised to call office if he experiences worsening of headaches during the interval time  Greater than 50% of time during this 25 minute visit was spent on counseling,explanation of diagnosis, planning of further management, discussion with patient and family and coordination of care  Venancio Poisson, [facility]  [facility] [address] [place],  [address]  Phone [phone] Fax [phone] Note: This document was prepared with digital dictation and possible smart phrase technology. Any transcriptional errors that result from this process are unintentional.

## 2018-04-24 NOTE — Progress Notes (Signed)
I agree with the above plan 

## 2018-04-29 ENCOUNTER — Ambulatory Visit: Payer: Self-pay | Attending: Family Medicine

## 2018-05-07 MED FILL — metFORMIN HCL 500 MG TABS: 500 | 30 days supply | Qty: 15 | Fill #1

## 2018-05-07 MED FILL — ATORVASTATIN 80 MG TABLET: 80 | 30 days supply | Qty: 30 | Fill #4

## 2018-06-08 ENCOUNTER — Encounter: Payer: Self-pay | Admitting: Family Medicine

## 2018-06-08 ENCOUNTER — Ambulatory Visit (INDEPENDENT_AMBULATORY_CARE_PROVIDER_SITE_OTHER): Payer: Self-pay | Admitting: Family Medicine

## 2018-06-08 VITALS — BP 125/83 | HR 54 | Temp 98.1°F | Resp 14 | Ht 69.0 in | Wt 194.0 lb

## 2018-06-08 DIAGNOSIS — E785 Hyperlipidemia, unspecified: Secondary | ICD-10-CM

## 2018-06-08 DIAGNOSIS — E119 Type 2 diabetes mellitus without complications: Secondary | ICD-10-CM

## 2018-06-08 DIAGNOSIS — Z789 Other specified health status: Secondary | ICD-10-CM

## 2018-06-08 LAB — POCT URINALYSIS DIPSTICK
Bilirubin, UA: NEGATIVE
Blood, UA: NEGATIVE
Glucose, UA: NEGATIVE
Ketones, UA: NEGATIVE
Leukocytes, UA: NEGATIVE
Nitrite, UA: NEGATIVE
Protein, UA: NEGATIVE
Spec Grav, UA: 1.02 (ref 1.010–1.025)
Urobilinogen, UA: 0.2 E.U./dL
pH, UA: 5 (ref 5.0–8.0)

## 2018-06-08 MED ORDER — ATORVASTATIN CALCIUM 80 MG PO TABS: 80.0000 mg | ORAL_TABLET | Freq: Every day | ORAL | 5 refills | Status: DC

## 2018-06-08 MED FILL — ATORVASTATIN 80 MG TABLET: 80 | 30 days supply | Qty: 30 | Fill #0

## 2018-06-08 MED FILL — metFORMIN HCL 500 MG TABS: 500 | 30 days supply | Qty: 30 | Fill #3

## 2018-06-08 NOTE — Progress Notes (Signed)
Patient Drew [name]   Progress Note: General Provider: Lanae Boast, FNP  SUBJECTIVE:   Drew [name] is a 43 y.o. male who  has a past medical history of Facial nerve palsy, Stroke ([place]), and Type 2 diabetes mellitus ([place]).. Patient presents today for Diabetes  Patient accompanied by wife. He is doing well. No complaints today. He states that he has been eating more unhealthy foods since the holidays. Patient states that he has been working and has not had time to exercise.   Review of Systems  Constitutional: Negative.   HENT: Negative.   Eyes: Negative.   Respiratory: Negative.   Cardiovascular: Negative.   Gastrointestinal: Negative.   Genitourinary: Negative.   Musculoskeletal: Negative.   Skin: Negative.   Neurological: Negative.   Psychiatric/Behavioral: Negative.      OBJECTIVE: BP 125/83 (BP Location: Left Arm, Patient Position: Sitting, Cuff Size: Normal)   Pulse (!) 54   Temp 98.1 F (36.7 C) (Oral)   Resp 14   Ht 5\' 9"  (1.753 m)   Wt 194 lb (88 kg)   SpO2 100%   BMI 28.65 kg/m   Wt Readings from Last 3 Encounters:  06/08/18 194 lb (88 kg)  04/24/18 191 lb 9.6 oz (86.9 kg)  03/06/18 184 lb (83.5 kg)     Physical Exam Vitals signs and nursing note reviewed.  Constitutional:      General: He is not in acute distress.    Appearance: He is well-developed.  HENT:     Head: Normocephalic and atraumatic.  Eyes:     Conjunctiva/sclera: Conjunctivae normal.     Pupils: Pupils are equal, round, and reactive to light.  Neck:     Musculoskeletal: Normal range of motion.  Cardiovascular:     Rate and Rhythm: Normal rate and regular rhythm.     Heart sounds: Normal heart sounds.  Pulmonary:     Effort: Pulmonary effort is normal. No respiratory distress.     Breath sounds: Normal breath sounds.  Abdominal:     General: Bowel sounds are normal. There is no distension.     Palpations: Abdomen is soft.    Musculoskeletal: Normal range of motion.  Skin:    General: Skin is warm and dry.  Neurological:     Mental Status: He is alert and oriented to person, place, and time.  Psychiatric:        Behavior: Behavior normal.        Thought Content: Thought content normal.     ASSESSMENT/PLAN:  1. Diabetes mellitus without complication ([place]) No medication changes warranted at the present time.  The patient is asked to make an attempt to improve diet and exercise patterns to aid in medical management of this problem. Patient has gained 10 pounds since October visit.  - Urinalysis Dipstick  2. Hyperlipidemia LDL goal <100 Pending labs. Will adjust medications accordingly.      - atorvastatin (LIPITOR) 80 MG tablet; Take 1 tablet (80 mg total) by mouth daily at 6 PM.  Dispense: 30 tablet; Refill: 5 - Lipid Panel  3. Language barrier to communication Due to language barrier, an interpreter was present during the history-taking and subsequent discussion (and for part of the physical exam) with this patient via video.      Return in about 3 months (around 09/07/2018) for DM.    The patient was given clear instructions to go to ER or return to medical center if symptoms do not improve,  worsen or new problems develop. The patient verbalized understanding and agreed with plan of [name].   Ms. Doug Sou. Nathaneil Canary, FNP-BC Patient [facility] [address] [place], [place] [address] [phone]

## 2018-06-08 NOTE — Patient Instructions (Signed)
La diabetes mellitus y las normas bsicas de atencin mdica Diabetes Mellitus and Standards of Medical Care Tratar la diabetes (diabetes mellitus) puede ser complicado. Su tratamiento de la diabetes puede ser administrado por un equipo de profesionales de la salud, que incluye:  Un mdico especializado en diabetes (endocrinlogo).  Un enfermero especializado o auxiliar mdico.  Enfermeros.  Un especialista en dietas y nutricin (nutricionista registrado).  Un educador certificado para el cuidado de la diabetes.  Un especialista en actividad fsica.  Un farmacutico.  Drew [name].  Un especialista en pies (podlogo).  Un dentista.  Un mdico de cabecera.  Un profesional de salud mental. Los mdicos siguen pautas para ayudarlo a Therapist, music la mejor calidad de atencin. El programa siguiente es una gua general para su plan de control de la diabetes. Los mdicos tambin podrn darle instrucciones ms especficas. Exmenes fsicos Tras ser diagnosticado con diabetes mellitus, y cada ao luego de esto, su mdico le preguntar acerca de sus antecedentes mdicos y familiares. Tambin har un examen fsico. Este examen puede incluir:  Medicin de la estatura, peso e ndice de masa corporal [facility]).  Control de la presin arterial. Esto se realiza en cada visita mdica de rutina. La presin arterial deseada puede variar en funcin de sus enfermedades, edad y otros factores.  Examen de la glndula tiroidea.  Examen de la piel.  Examen para deteccin de dao en los nervios (neuropata perifrica). Esto puede incluir controlar el pulso de las piernas y los pies, y el nivel de sensibilidad en las manos y pies.  Un examen de pies completo para inspeccionar la estructura y la piel de los pies, lo que incluye controlar si hay cortes, hematomas, enrojecimiento, ampollas, llagas u otros problemas.  Examen de deteccin de problemas con los vasos sanguneos (vasculares), lo que puede incluir  controlar el pulso de las piernas y los pies y Therapist, sports. Anlisis de [place]. Segn el plan de tratamiento y [place] necesidades personales, es posible que se le realicen las siguientes pruebas:  Anlisis de HbA1c (hemoglobina A1c). Este anlisis proporciona informacin sobre el control de la glucemia (glucosa en la sangre) durante los ltimos 2 o 72mses. Se uCanadapara ajustar el plan de tratamiento, de ser necesario. Este anlisis se har: ? Al menos 2 veces al ao si cumple los objetivos del tratamiento. ? [employer] si no cumple los objetivos del tratamiento o si sus objetivos del [place]  Anlisis de lpidos, lo que incluye colesterol LDL y HDL y niveles de triglicridos. ? En relacin al LDL, el objetivo es tener menos de 10105mdl (5,5 mmol/l). Si tiene alto riesgo de complicaciones, el objetivo es tener menos de 70 mg/dl (3.9 mmol/l). ? En relacin al HDL, el objetivo es tener 40 mg/dl (2.2 mmol/l) o ms para los hombres y 50 mg/dl (2.8 mmol/l) o ms para las mujeres. Un nivel de colesterol HDL de 60 mg/dl (3.12m74m/l) o superior da una cierta proteccin contra la enfermedad cardaca. ? En relacin a los triglicridos, el objetivo es tener menos de 150 mg/dl (8,3 mmol/l).  Pruebas funcionales hepticas.  Pruebas de la funcin renal.  Pruebas de la funcin tiroidea. Exmenes dentales y ocuThe ServiceMaster Companyisite al denExxon Mobil Corporationces por ao.  Si tiene diabetes tipo1, el mdico puede recomendarle que se haga un examen ocular 3 a 5aos despus del diagnstico y, luego, unaArdelia Memsz al ao despus del priTree surgeon Para los nios que tienen diabetes tipo1, el pedEcologist  examen ocular cuando el nio tiene 10aos o ms y ha tenido diabetes durante 3 a 5 aos. Despus del primer examen, el nio debe hacerse un examen ocular una vez al ao.  Si tiene diabetes tipo2, el mdico puede recomendarle que se haga un examen ocular ni bien haya sido  diagnosticado y, luego, Dollene Cleveland al ao despus del Tree surgeon. Vacunas   Se recomienda aplicar de forma anual la vacuna contra la gripe (influenza) a todas las personas de 53meses en adelante que tengan diabetes.  La vacuna contra la neumona (antineumoccica) est recomendada para todas las personas de 2aos en adelante que tengan diabetes. Si tiene 65aos o ms, puede recibir Engineer, manufacturing como una serie de dos inyecciones [place].  Se recomienda administrar la vacuna contra la hepatitisB en adultos poco despus de que hayan recibido el diagnstico de diabetes.  Los adultos y los nios que tienen diabetes deben recibir todas las vacunas de acuerdo con las recomendaciones especficas segn la edad de los Centros para el Control y la Prevencin de Probation officer for Disease Control and Prevention, CDC). Salud mental y emocional Se recomienda Optometrist controles para Hydrographic surveyor sntomas de trastornos de Youth worker, ansiedad y depresin en el momento del diagnstico, y en una etapa posterior segn sea necesario. Si los controles revelan la presencia de sntomas (resultado positivo de los controles), es posible que deba someterse a ms evaluaciones y Fish farm manager con un profesional de salud mental. Plan de tratamiento Su plan de tratamiento ser revisado en cada visita mdica. Usted y Nature conservation officer lo siguiente:  Cmo est recibiendo los medicamentos, incluso la [place].  Cualquier efectos secundarios que est experimentando.  Sus objetivos deseados con relacin a la glucemia.  La frecuencia de su control de glucemia.  Hbitos del estilo de vida, como nivel de [place] as como consumo de tabaco, alcohol y Engineer, materials. Educacin para el autocontrol de la diabetes El mdico evaluar qu tan bien se encuentran los niveles de glucemia y si est recibiendo la cantidad correcta de [place]. El mdico puede derivarlo a:  Teaching laboratory technician en educacin para la diabetes  certificado para Air cabin crew la diabetes durante toda la vida, comenzando desde el diagnstico.  Un nutricionista matriculado que puede crear o revisar un plan de nutricin personal.  Un especialista en ejercicios que puede analizar el nivel de actividad y un plan de ejercicios. Resumen  Tratar la diabetes (diabetes mellitus) puede ser complicado. Su tratamiento de la diabetes puede ser administrado por un equipo de profesionales de Technical sales engineer.  Los mdicos siguen una gua para ayudarlo a Personal assistant mejor calidad de atencin.  Las normas asistenciales incluyen realizarse, regularmente, exmenes fsicos, anlisis de sangre y controles de la presin arterial, aplicarse vacunas, someterse a estudios de control e informarse sobre cmo controlar su diabetes.  Sus mdicos tambin podrn darle instrucciones ms especficas basndose en su salud. Esta informacin no tiene Marine scientist el consejo del mdico. Asegrese de hacerle al mdico cualquier pregunta que tenga. Document Released: 07/24/2009 Document Revised: 02/24/2017 Document Reviewed: 09/29/2012 Elsevier Interactive Patient Education  2019 [employer].

## 2018-06-09 LAB — LIPID PANEL
Chol/HDL Ratio: 2.5 ratio (ref 0.0–5.0)
Cholesterol, Total: 101 mg/dL (ref 100–199)
HDL: 40 mg/dL (ref >39)
LDL Calculated: 45 mg/dL (ref 0–99)
Triglycerides: 80 mg/dL (ref 0–149)
VLDL Cholesterol Cal: 16 mg/dL (ref 5–40)

## 2018-07-10 MED FILL — ?METFORMIN HCL 500MG TABL: 500 | 30 days supply | Qty: 30 | Fill #4

## 2018-07-10 MED FILL — ATORVASTATIN 80 MG TABLET: 80 | 30 days supply | Qty: 30 | Fill #0

## 2018-08-10 MED FILL — ?METFORMIN HCL 500MG TABL: 500 | 30 days supply | Qty: 30 | Fill #5

## 2018-08-10 MED FILL — ATORVASTATIN 80 MG TABLET: 80 | 30 days supply | Qty: 30 | Fill #1

## 2018-09-07 ENCOUNTER — Ambulatory Visit: Payer: Self-pay | Admitting: Family Medicine

## 2018-10-09 ENCOUNTER — Telehealth: Payer: Self-pay

## 2018-10-09 NOTE — Telephone Encounter (Signed)
Called using Language [employer] [phone].Called to do COVID Screening for appointment tomorrow. No answer. Left a message to call back. Thanks!

## 2018-10-12 ENCOUNTER — Other Ambulatory Visit: Payer: Self-pay

## 2018-10-12 ENCOUNTER — Ambulatory Visit (INDEPENDENT_AMBULATORY_CARE_PROVIDER_SITE_OTHER): Payer: Self-pay | Admitting: Family Medicine

## 2018-10-12 ENCOUNTER — Encounter: Payer: Self-pay | Admitting: Family Medicine

## 2018-10-12 VITALS — BP 117/74 | HR 50 | Temp 98.3°F | Resp 16 | Ht 69.0 in | Wt 190.0 lb

## 2018-10-12 DIAGNOSIS — M2550 Pain in unspecified joint: Secondary | ICD-10-CM

## 2018-10-12 DIAGNOSIS — Z789 Other specified health status: Secondary | ICD-10-CM

## 2018-10-12 DIAGNOSIS — J301 Allergic rhinitis due to pollen: Secondary | ICD-10-CM

## 2018-10-12 DIAGNOSIS — E119 Type 2 diabetes mellitus without complications: Secondary | ICD-10-CM

## 2018-10-12 DIAGNOSIS — E785 Hyperlipidemia, unspecified: Secondary | ICD-10-CM

## 2018-10-12 LAB — POCT URINALYSIS DIPSTICK
Bilirubin, UA: NEGATIVE
Blood, UA: NEGATIVE
Glucose, UA: NEGATIVE
Ketones, UA: NEGATIVE
Leukocytes, UA: NEGATIVE
Nitrite, UA: NEGATIVE
Protein, UA: NEGATIVE
Spec Grav, UA: 1.025 (ref 1.010–1.025)
Urobilinogen, UA: 0.2 E.U./dL
pH, UA: 5 (ref 5.0–8.0)

## 2018-10-12 LAB — POCT GLYCOSYLATED HEMOGLOBIN (HGB A1C): Hemoglobin A1C: 6.2 % — AB (ref 4.0–5.6)

## 2018-10-12 MED ORDER — ASPIRIN 81 MG PO TBEC: 81.0000 mg | DELAYED_RELEASE_TABLET | Freq: Every day | ORAL | 3 refills | Status: DC

## 2018-10-12 MED ORDER — CETIRIZINE HCL 10 MG PO TABS: 10.0000 mg | ORAL_TABLET | Freq: Every day | ORAL | 11 refills | Status: DC

## 2018-10-12 MED ORDER — ATORVASTATIN CALCIUM 80 MG PO TABS: 80.0000 mg | ORAL_TABLET | Freq: Every day | ORAL | 3 refills | Status: DC

## 2018-10-12 MED ORDER — FLUTICASONE PROPIONATE 50 MCG/ACT NA SUSP: 2.0000 | Freq: Every day | NASAL | 6 refills | Status: DC

## 2018-10-12 MED ORDER — METFORMIN HCL 500 MG PO TABS: 500.0000 mg | ORAL_TABLET | Freq: Every day | ORAL | 2 refills | Status: DC

## 2018-10-12 MED FILL — ?ATORVASTATIN 40MG TABLET: 40 | 30 days supply | Qty: 60 | Fill #0

## 2018-10-12 MED FILL — ?CETIRIZINE HCL 10 MG TABLE: 10 | 30 days supply | Qty: 30 | Fill #0

## 2018-10-12 MED FILL — ?METFORMIN HCL 500MG TABLET: 500 | 30 days supply | Qty: 30 | Fill #0

## 2018-10-12 MED FILL — FLUTICASONE PROP 50 MCG SPR: 50 | 30 days supply | Qty: 16 | Fill #0

## 2018-10-12 NOTE — Progress Notes (Signed)
[facility]   Progress Note: General Provider: Lanae Boast, FNP  SUBJECTIVE:   Drew [name] is a 43 y.o. male who  has a past medical history of Facial nerve palsy, Stroke ([place]), and Type 2 diabetes mellitus ([place]).. Patient presents today for Diabetes and Hand Pain (fingers hurts and he can't bend them sometimes )  Patient followed by neurology for management of stroke-like symptoms and headache. He was last seen by [facility] on 04/24/2018 and instructed to follow up in 6 months.  Today, he is accompanied by his wife and the interpreter services are being used. He states that he has pain in his fingers and at times, he has decreased range of motion due to this. He has not tried any medications for this. He states that his home BP and glucose readings are within normal range. He reports compliance with medications and denies side effects at the present time.    ROS   OBJECTIVE: BP 117/74 (BP Location: Right Arm, Patient Position: Sitting, Cuff Size: Normal)   Pulse (!) 50   Temp 98.3 F (36.8 C) (Oral)   Resp 16   Ht 5\' 9"  (1.753 m)   Wt 190 lb (86.2 kg)   SpO2 100%   BMI 28.06 kg/m   Wt Readings from Last 3 Encounters:  10/12/18 190 lb (86.2 kg)  06/08/18 194 lb (88 kg)  04/24/18 191 lb 9.6 oz (86.9 kg)     Physical Exam Vitals signs and nursing note reviewed.  Constitutional:      General: He is not in acute distress.    Appearance: Normal appearance.  HENT:     Head: Normocephalic and atraumatic.  Eyes:     Extraocular Movements: Extraocular movements intact.     Conjunctiva/sclera: Conjunctivae normal.     Pupils: Pupils are equal, round, and reactive to light.  Cardiovascular:     Rate and Rhythm: Normal rate and regular rhythm.     Heart sounds: No murmur.  Pulmonary:     Effort: Pulmonary effort is normal.     Breath sounds: Normal breath sounds.  Musculoskeletal: Normal range of motion.     Right hand: He  exhibits bony tenderness.     Left hand: He exhibits bony tenderness.  Skin:    General: Skin is warm and dry.  Neurological:     Mental Status: He is alert and oriented to person, place, and time.  Psychiatric:        Mood and Affect: Mood normal.        Behavior: Behavior normal.        Thought Content: Thought content normal.        Judgment: Judgment normal.     ASSESSMENT/PLAN:  1. Diabetes mellitus without complication (HCC) - Urinalysis Dipstick - HgB A1c - Microalbumin/Creatinine Ratio, Urine - metFORMIN (GLUCOPHAGE) 500 MG tablet; Take 1 tablet (500 mg total) by mouth daily with breakfast.  Dispense: 90 tablet; Refill: 2 - aspirin 81 MG EC tablet; Take 1 tablet (81 mg total) by mouth daily.  Dispense: 90 tablet; Refill: 3  2. Hyperlipidemia LDL goal <100 - atorvastatin (LIPITOR) 80 MG tablet; Take 1 tablet (80 mg total) by mouth daily at 6 PM.  Dispense: 90 tablet; Refill: 3 - aspirin 81 MG EC tablet; Take 1 tablet (81 mg total) by mouth daily.  Dispense: 90 tablet; Refill: 3  3. Arthralgia, unspecified joint Will check for rheumatoid arthritis due to pain and swelling.  -  Rheumatoid Arthritis Profile  4. Seasonal allergic rhinitis due to pollen - fluticasone (FLONASE) 50 MCG/ACT nasal spray; Place 2 sprays into both nostrils daily.  Dispense: 16 g; Refill: 6  Zyrtec 10 mg po daily. 5. Language barrier to communication.  Interpreting services used throughout the interview.  Return in about 6 months (around 04/13/2019) for DM.    The patient was given clear instructions to go to ER or return to medical center if symptoms do not improve, worsen or new problems develop. The patient verbalized understanding and agreed with plan of care.   Ms. Doug Sou. Nathaneil Canary, FNP-BC Patient [facility] [address] [place], [place] [address] [phone]

## 2018-10-12 NOTE — Patient Instructions (Addendum)
La diabetes mellitus y las normas bsicas de atencin mdica Diabetes Mellitus and Standards of Medical Care Tratar la diabetes (diabetes mellitus) puede ser complicado. Su tratamiento de la diabetes puede ser administrado por un equipo de profesionales de la salud, que incluye:  Un mdico especializado en diabetes (endocrinlogo).  Un enfermero especializado o auxiliar mdico.  Enfermeros.  Un especialista en dietas y nutricin (nutricionista registrado).  Un educador certificado para el cuidado de la diabetes.  Un especialista en actividad fsica.  Un farmacutico.  Eliot Ford.  Un especialista en pies (podlogo).  Un dentista.  Un mdico de cabecera.  Un profesional de salud mental. Los mdicos siguen pautas para ayudarlo a Therapist, music la mejor calidad de atencin. El programa siguiente es una gua general para su plan de control de la diabetes. Los mdicos tambin podrn darle instrucciones ms especficas. Exmenes fsicos Tras ser diagnosticado con diabetes mellitus, y cada ao luego de esto, su mdico le preguntar acerca de sus antecedentes mdicos y familiares. Tambin har un examen fsico. Este examen puede incluir:  Medicin de la estatura, peso e ndice de masa corporal [facility]).  Control de la presin arterial. Esto se realiza en cada visita mdica de rutina. La presin arterial deseada puede variar en funcin de sus enfermedades, edad y otros factores.  Examen de la glndula tiroidea.  Examen de la piel.  Examen para deteccin de dao en los nervios (neuropata perifrica). Esto puede incluir controlar el pulso de las piernas y los pies, y el nivel de sensibilidad en las manos y pies.  Un examen de pies completo para inspeccionar la estructura y la piel de los pies, lo que incluye controlar si hay cortes, hematomas, enrojecimiento, ampollas, llagas u otros problemas.  Examen de deteccin de problemas con los vasos sanguneos (vasculares), lo que puede incluir  controlar el pulso de las piernas y los pies y Therapist, sports. Anlisis de [place]. Segn el plan de tratamiento y [place] necesidades personales, es posible que se le realicen las siguientes pruebas:  Anlisis de HbA1c (hemoglobina A1c). Este anlisis proporciona informacin sobre el control de la glucemia (glucosa en la sangre) durante los ltimos 2 o 23meses. [place] para ajustar el plan de tratamiento, de ser necesario. Este anlisis se har: ? Al menos 2 veces al ao si cumple los objetivos del tratamiento. ? [employer], si no cumple los objetivos del tratamiento o si sus objetivos del [place].  Anlisis de lpidos, lo que incluye colesterol LDL y HDL y niveles de triglicridos. ? En relacin al LDL, el objetivo es tener menos de 100mg /dl (5,5 mmol/l). Si tiene alto riesgo de complicaciones, el objetivo es tener menos de 70 mg/dl (3.9 mmol/l). ? En relacin al HDL, el objetivo es tener 40 mg/dl (2.2 mmol/l) o ms para los hombres y 50 mg/dl (2.8 mmol/l) o ms para las mujeres. Un nivel de colesterol HDL de 60 mg/dl (3.59mmol/l) o superior da una cierta proteccin contra la enfermedad cardaca. ? En relacin a los triglicridos, el objetivo es tener menos de 150 mg/dl (8,3 mmol/l).  Pruebas funcionales hepticas.  Pruebas de la funcin renal.  Pruebas de la funcin tiroidea. Exmenes dentales y [employer]  Visite al [employer] veces por ao.  Si tiene diabetes tipo1, el mdico puede recomendarle que se haga un examen ocular 3 a 5aos despus del diagnstico y, luego, Ardelia Mems vez al ao despus del Tree surgeon. ? Para los nios que tienen diabetes tipo1, el Ecologist un  examen ocular cuando el nio tiene 10aos o ms y ha tenido diabetes durante 3 a 5 aos. Despus del primer examen, el nio debe hacerse un examen ocular una vez al ao.  Si tiene diabetes tipo2, el mdico puede recomendarle que se haga un examen ocular ni bien haya sido  diagnosticado y, luego, Dollene Cleveland al ao despus del Tree surgeon. Vacunas   Se recomienda aplicar de forma anual la vacuna contra la gripe (influenza) a todas las personas de 66meses en adelante que tengan diabetes.  La vacuna contra la neumona (antineumoccica) est recomendada para todas las personas de 2aos en adelante que tengan diabetes. Si tiene 65aos o ms, puede recibir Engineer, manufacturing como una serie de dos inyecciones [place].  Se recomienda administrar la vacuna contra la hepatitisB en adultos poco despus de que hayan recibido el diagnstico de diabetes.  Los adultos y los nios que tienen diabetes deben recibir todas las vacunas de acuerdo con las recomendaciones especficas segn la edad de los Centros para el Control y la Prevencin de Probation officer for Disease Control and Prevention, CDC). Salud mental y emocional Se recomienda Optometrist controles para Hydrographic surveyor sntomas de trastornos de Youth worker, ansiedad y depresin en el momento del diagnstico, y en una etapa posterior segn sea necesario. Si los controles revelan la presencia de sntomas (resultado positivo de los controles), es posible que deba someterse a ms evaluaciones y Fish farm manager con un profesional de salud mental. Plan de tratamiento Su plan de tratamiento ser revisado en cada visita mdica. Usted y Nature conservation officer lo siguiente:  Cmo est recibiendo los medicamentos, incluso la [place].  Cualquier efectos secundarios que est experimentando.  Sus objetivos deseados con relacin a la glucemia.  La frecuencia de su control de glucemia.  Hbitos del estilo de vida, como nivel de [place] as como consumo de tabaco, alcohol y Engineer, materials. Educacin para el autocontrol de la diabetes El mdico evaluar qu tan bien se encuentran los niveles de glucemia y si est recibiendo la cantidad correcta de [place]. El mdico puede derivarlo a:  Teaching laboratory technician en educacin para la diabetes  certificado para Air cabin crew la diabetes durante toda la vida, comenzando desde el diagnstico.  Un nutricionista matriculado que puede crear o revisar un plan de nutricin personal.  Un especialista en ejercicios que puede analizar el nivel de actividad y un plan de ejercicios. Resumen  Tratar la diabetes (diabetes mellitus) puede ser complicado. Su tratamiento de la diabetes puede ser administrado por un equipo de profesionales de Technical sales engineer.  Los mdicos siguen una gua para ayudarlo a Personal assistant mejor calidad de atencin.  Las normas asistenciales incluyen realizarse, regularmente, exmenes fsicos, anlisis de sangre y controles de la presin arterial, aplicarse vacunas, someterse a estudios de control e informarse sobre cmo controlar su diabetes.  Sus mdicos tambin podrn darle instrucciones ms especficas basndose en su salud. Esta informacin no tiene Marine scientist el consejo del mdico. Asegrese de hacerle al mdico cualquier pregunta que tenga. Document Released: 07/24/2009 Document Revised: 02/24/2017 Document Reviewed: 09/29/2012 Elsevier Interactive Patient Education  2019 [place] (Arthritis) El significado del trmino artritis es dolor de las articulaciones. Tambin puede significar enfermedad articular. Una articulacin es TEFL teacher de unin de [place]. Las personas que sufren artritis pueden tener lo siguiente:  Enrojecimiento de las articulaciones.  Inflamaciones articulares.  Articulaciones rgidas.  Articulaciones calientes.  Cristy Hilts.  Sensacin de estar enfermo. CUIDADOS EN EL [place] Est atento a cualquier cambio en los sntomas.  Tome estas medidas para Best boy y la hinchazn. Medicamentos  [employer] de venta libre y los recetados solamente como se lo haya indicado el mdico.  No tome aspirina para Best boy si el mdico le dice que puede tener gota. Actividades  Ponga la articulacin en reposo si el  mdico se lo indica.  Evite las actividades que intensifiquen Conservation officer, historic buildings.  Haga ejercicios con la articulacin como se lo haya indicado el mdico. Intente hacer ejercicios tales como: ? Natacin. ? Gimnasia acutica. ? Andar en bicicleta. ? Caminar. Cuidado de la articulacin  Si la articulacin est hinchada, mantngala elevada si el mdico se lo indic.  Si al despertar por la maana, nota que la articulacin est rgida, intente tomar una ducha con agua tibia.  Si es diabtico, no se ponga calor sin consultarle al mdico.  Si se lo indican, pngase calor en la articulacin: ? Coloque una toalla entre la articulacin y la compresa caliente o la almohadilla trmica. ? Coloque el calor en la zona durante 20 o 56minutos.  Si se lo indican, pngase hielo en la articulacin: ? Ponga el hielo en una bolsa plstica. ? Coloque una [employer] piel y la bolsa de hielo. ? Coloque el hielo durante 57minutos, 2 a 3veces por da.  Concurra a todas las visitas de control como se lo haya indicado el mdico. SOLICITE AYUDA SI:  El dolor empeora.  Tiene fiebre. SOLICITE AYUDA DE INMEDIATO SI:  Siente un dolor muy intenso en la articulacin.  Se le hincha la articulacin.  La articulacin est enrojecida.  Siente dolor en muchas articulaciones y se le hinchan.  Siente un dolor muy intenso en la espalda.  Tiene la pierna muy dbil.  No puede controlar la miccin (orina) o la defecacin (materia fecal). Esta informacin no tiene Marine scientist el consejo del mdico. Asegrese de hacerle al mdico cualquier pregunta que tenga. Document Released: 10/29/2011 Document Revised: 08/21/2015 Document Reviewed: 07/25/2014 Elsevier Interactive Patient Education  2019 [employer].

## 2018-10-13 LAB — MICROALBUMIN / CREATININE URINE RATIO
Creatinine, Urine: 95.4 mg/dL
Microalb/Creat Ratio: <3 mg/g creat (ref 0–29)
Microalbumin, Urine: <3 ug/mL

## 2018-10-14 ENCOUNTER — Telehealth: Payer: Self-pay

## 2018-10-14 LAB — RHEUMATOID ARTHRITIS PROFILE
Cyclic Citrullin Peptide Ab: 9 units (ref 0–19)
Rhuematoid fact SerPl-aCnc: <10 IU/mL (ref 0.0–13.9)

## 2018-10-14 NOTE — Telephone Encounter (Signed)
Called using [id number], no answer. Left a message for patient to call back to our office. Thanks!

## 2018-10-14 NOTE — Telephone Encounter (Signed)
-----   Message from Lanae Boast, [place] sent at 10/14/2018  8:22 AM EDT ----- Please let patient know that his labs did not reveal rheumatoid arthritis that is caused by an autoimmune disorder. He most likely has osteoarthritis which is a degeneration of bones. This can come from overuse of the joints. You can take over the counter acetaminophen or ibuprofen to help with the pain involved. No other medication changes. Labs are stable.

## 2018-10-15 NOTE — Telephone Encounter (Signed)
Called using Interpreter [id number]. Advised patient that labs did nor reveal rheumatoid arthritis and he most likely has osteoarthritis. Advised that he can take otc acetaminophen or ibuprofen for pain involved. Advised that all other labs were stale and no medication changes needed at this time. Patient verbalized understanding. Thanks !

## 2018-10-21 ENCOUNTER — Telehealth: Payer: Self-pay

## 2018-10-21 NOTE — Telephone Encounter (Signed)
Spoke with the patient and they have given verbal consent to file their insurance and to do a doxy.me visit. Mobile number and carrier have been confirmed and sent.   Text: [phone] [facility])

## 2018-10-26 ENCOUNTER — Ambulatory Visit (INDEPENDENT_AMBULATORY_CARE_PROVIDER_SITE_OTHER): Payer: Self-pay | Admitting: Adult Health

## 2018-10-26 ENCOUNTER — Other Ambulatory Visit: Payer: Self-pay

## 2018-10-26 ENCOUNTER — Encounter: Payer: Self-pay | Admitting: Adult Health

## 2018-10-26 DIAGNOSIS — E119 Type 2 diabetes mellitus without complications: Secondary | ICD-10-CM

## 2018-10-26 DIAGNOSIS — E785 Hyperlipidemia, unspecified: Secondary | ICD-10-CM

## 2018-10-26 DIAGNOSIS — G43009 Migraine without aura, not intractable, without status migrainosus: Secondary | ICD-10-CM

## 2018-10-26 NOTE — Progress Notes (Signed)
[facility] [address] [place]. [place] [address] [phone]       FOLLOW-UP NOTE  [name] Date of Birth:  1976-05-04 Medical Record Number:  [id number]   Virtual Visit via Video Note  I connected with Drew [name] on 10/26/18 at  9:15 AM EDT by a video enabled telemedicine application located remotely in my own home and verified that I am speaking with the correct person using two identifiers who was located at their own home accompanied by his son who is interpreting.   I discussed the limitations of evaluation and management by telemedicine and the availability of in person appointments. The patient expressed understanding and agreed to proceed.    HPI initial visit 01/21/2018 PS: 35 year Hispanic male seen today for initial office follow-up visit following hospital admission for TIA versus computed migraine in August 2019.He is accompanied by his wife and Spanish language interpreter.Drew [name] is an 43 y.o. male with PMH of HTN, HLD, DM with history of strokelike symptoms in 2018 where he had a lower facial droop and left facial numbness presents with sudden onset right side weakness and numbness, slurred speech and left facial numbness around 3 pm. Patient had difficulty with walking and presented to OSH ER. History of occipital headache x 3 days. BP 159/77 on arrival to ED.Was seen by tele neurology who recommended admission . EDP called Dr Lorraine Lax who thought this maybe a complicated migraine and asked for face to face neurological evaluation and transferred to [facility] for MRI Brain. Which was normal. Transthoracic echo showed normal ejection fraction.LDL cholesterol was elevated at 148 mg percent and hemoglobin A1c was 5.5. CT angiogram of the brain and neck both were unremarkable. Patient was started on Topamax for headaches. He states he still has headaches off and on but they're mostly dull and posterior located. He had a severe headache a few weeks ago. His  primary physician and started him on nortriptyline 25 mg at night which helps him sleep. He supposed to been Topamax twice daily but he takes it only as needed. Is taking aspirin everyday. Patient states his numbness has resolved. Headache is a lot improved but still persistent. He has no new complaints.  history 04/24/2018: Patient returns today for follow-up visit for migraine management and is accompanied by wife and interpreter as patient is spanish speaking. He states he has been doing well. He states headaches have been much less occurring approx every 3 weeks or so. When he does have his headaches he states he will have the headache for about a day but much less in severity. He has stopped taking nortriptyline and topamax about 1 month ago and denies worsening. He stopped these medications as he was doing better and attempting to decrease his medications. He does continue to take aspirin 81mg  without side effects of bleeding or bruising. He continues to take lipitor without side effects of myalgias. Blood pressure today satisfactory at 122/74. He does monitor at home at this is his normal level.  He has returned back to all previous activities without complications.  No further concerns at this time.  Denies new or worsening stroke/TIA symptoms.  10/26/2018 VIRTUAL VISIT: Drew [name] is being seen today for 37-month follow-up visit regarding TIA versus complicated migraine with ED admission in 12/2017.  He was initially prescribed nortriptyline and Topamax with overall improvement and had discontinued both prior to previous appointment in 04/2018 as headaches improved.  Headaches are very mild and only occur on occasion.  He will take tylenol with benefit.  He continues on aspirin 81 mg and atorvastatin 80 mg without reported side effects.  Recent lab work by PCP on 10/12/2018 showed A1c at 6.2 and lipid panel on 06/08/2018 showed satisfactory LDL at 45. Glucose levels monitored at home which have been stable  without any elevated readings. Blood pressure not monitored at home as it is typically stable. No concerns today. Denies new or worsening stroke/TIA symptoms.     ROS:   14 system review of systems is positive for mild headaches and all other systems negative  PMH:  Past Medical History:  Diagnosis Date   Facial nerve palsy    Stroke ([place])    Type 2 diabetes mellitus (HCC)     Social History:  Social History   Socioeconomic History   Marital status: Married    Spouse name: Not on file   Number of children: Not on file   Years of education: Not on file   Highest education level: Not on file  Occupational History   Not on file  Social Needs   Financial resource strain: Not on file   Food insecurity    Worry: Not on file    Inability: Not on file   Transportation needs    Medical: Not on file    Non-medical: Not on file  Tobacco Use   Smoking status: Former Smoker   Smokeless tobacco: Never Used  Substance and Sexual Activity   Alcohol use: No    Frequency: Never   Drug use: No   Sexual activity: Not on file  Lifestyle   Physical activity    Days per week: Not on file    Minutes per session: Not on file   Stress: Not on file  Relationships   Social connections    Talks on phone: Not on file    Gets together: Not on file    Attends religious service: Not on file    Active member of club or organization: Not on file    Attends meetings of clubs or organizations: Not on file    Relationship status: Not on file   Intimate partner violence    Fear of current or ex partner: Not on file    Emotionally abused: Not on file    Physically abused: Not on file    Forced sexual activity: Not on file  Other Topics Concern   Not on file  Social History Narrative   Not on file    Medications:   Current Outpatient Medications on File Prior to Visit  Medication Sig Dispense Refill   aspirin 81 MG EC tablet Take 1 tablet (81 mg total) by mouth  daily. 90 tablet 3   atorvastatin (LIPITOR) 80 MG tablet Take 1 tablet (80 mg total) by mouth daily at 6 PM. 90 tablet 3   cetirizine (ZYRTEC) 10 MG tablet Take 1 tablet (10 mg total) by mouth daily. 30 tablet 11   fluticasone (FLONASE) 50 MCG/ACT nasal spray Place 2 sprays into both nostrils daily. 16 g 6   metFORMIN (GLUCOPHAGE) 500 MG tablet Take 1 tablet (500 mg total) by mouth daily with breakfast. 90 tablet 2   No current facility-administered medications on file prior to visit.     Allergies:  No Known Allergies  Physical Exam General: well developed, well nourished, pleasant middle-aged Spanish-speaking Hispanic male, seated, in no evident distress Head: head normocephalic and atraumatic.    Neurologic Exam Mental Status: Awake and fully alert.  Oriented to place and time person. Recent and remote memory intact. Attention span, concentration and fund of knowledge appropriate. Mood and affect appropriate.  Cranial Nerves: Extraocular movements full without nystagmus. Hearing intact to voice. Facial sensation intact. Face, tongue, palate moves normally and symmetrically.  Shoulder shrug symmetric. Motor: No evidence of weakness per drift assessment Sensory.: intact to light touch Coordination: Rapid alternating movements normal in all extremities. Finger-to-nose and heel-to-shin performed accurately bilaterally. Gait and Station: Arises from chair without difficulty. Stance is normal. Gait demonstrates normal stride length and balance .  Reflexes: UTA     ASSESSMENT: 43 year old Hispanic male with transient episode of headache with face and body paresthesias likely complicated migraine episode. History of similar episode in the past.  Residual daily headaches with benefit on nortriptyline and Topamax.  Patient self discontinued nortriptyline and Topamax due to improvement of headaches.  Headaches occur rarely and will take Tylenol with benefit.   PLAN: -Continue aspirin 81 mg  and atorvastatin 80 mg for secondary stroke prevention -f/u with PCP for HLD, HTN and DM management -Advised patient to continue to stay active and maintain a healthy diet -Continue to monitor blood pressure at home -Continue Tylenol as needed for headaches -Maintain strict control of hypertension with blood pressure goal below 130/90, diabetes with hemoglobin A1c goal below 6.5% and cholesterol with LDL cholesterol (bad cholesterol) goal below 70 mg/dL. I also advised the patient to eat a healthy diet with plenty of whole grains, cereals, fruits and vegetables, exercise regularly and maintain ideal body weight.  Follow up as needed  Greater than 50% of time during this 15 minute visit was spent on counseling,explanation of diagnosis, planning of further management, discussion with patient and family and coordination of care  Venancio Poisson, [facility]  [facility] [address] [place], [place] [address]  Phone [phone] Fax [phone] Note: This document was prepared with digital dictation and possible smart phrase technology. Any transcriptional errors that result from this process are unintentional.

## 2018-10-26 NOTE — Progress Notes (Signed)
Medication and pharmacy updated for chart.

## 2018-10-26 NOTE — Telephone Encounter (Signed)
I called pt to update chart. I stated he had interpreter services schedule. Pt gave permission to have his son who is 69 to interpreted for him if needed. I stated to click link 10 minutes early.RN call the interpreter to have the services cancel.

## 2018-10-26 NOTE — Progress Notes (Signed)
I agree with the above plan 

## 2018-11-11 MED FILL — ?ATORVASTATIN 40MG TABLET: 40 | 30 days supply | Qty: 60 | Fill #1

## 2018-11-11 MED FILL — FLUTICASONE PROP 50 MCG SPR: 50 | 30 days supply | Qty: 16 | Fill #1

## 2018-11-11 MED FILL — ?METFORMIN HCL 500MG TABLET: 500 | 30 days supply | Qty: 30 | Fill #1

## 2018-11-11 MED FILL — ?CETIRIZINE HCL 10 MG TABLE: 10 | 30 days supply | Qty: 30 | Fill #1

## 2018-12-14 MED FILL — ?METFORMIN HCL 500MG TABLET: 500 | 30 days supply | Qty: 30 | Fill #2

## 2018-12-14 MED FILL — ?ATORVASTATIN 40MG TABLET: 40 | 30 days supply | Qty: 60 | Fill #2

## 2018-12-14 MED FILL — FLUTICASONE PROP 50 MCG SPR: 50 | 30 days supply | Qty: 16 | Fill #2

## 2018-12-14 MED FILL — ?CETIRIZINE HCL 10 MG TABLE: 10 | 30 days supply | Qty: 30 | Fill #2

## 2018-12-30 IMAGING — CT CT HEAD W/O CM
3 series · 16 of 47 positions shown, 19 images · non-contrast
Comparison: None.

CLINICAL DATA: Right facial, arm and leg weakness.

EXAM:
CT HEAD WITHOUT CONTRAST
TECHNIQUE: Contiguous axial images were obtained from the base of the skull
through the vertex without intravenous contrast.

[Series 2: head wo · axial · 0.40mm/px · z∈[-139,-14]mm · 10 of 31 slices shown, 13 images]
[im 3/31  brain]
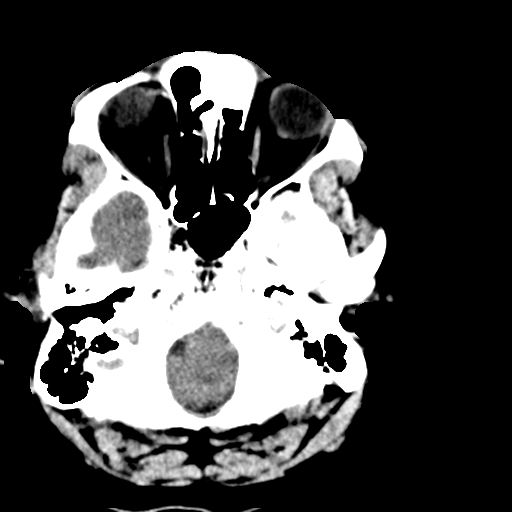
[im 3/31  bone]
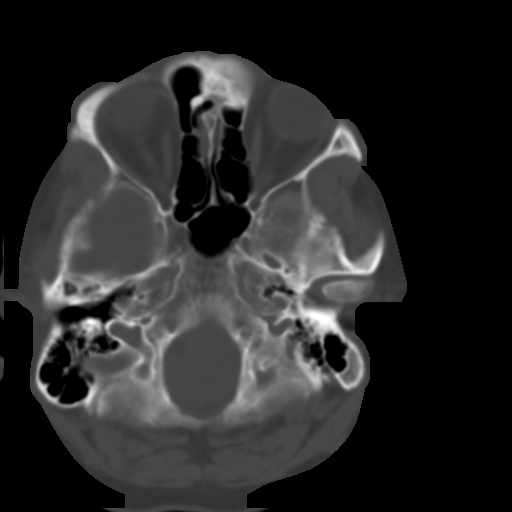
[im 6/31  brain]
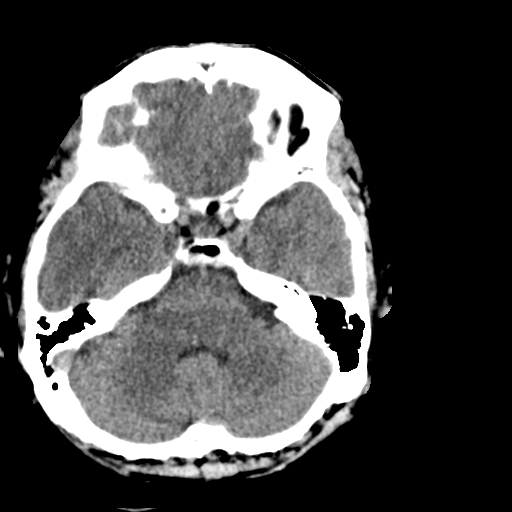
[im 9/31  brain]
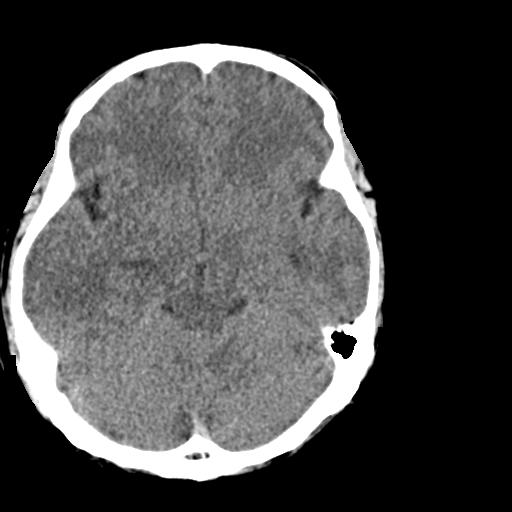
[im 11/31  brain]
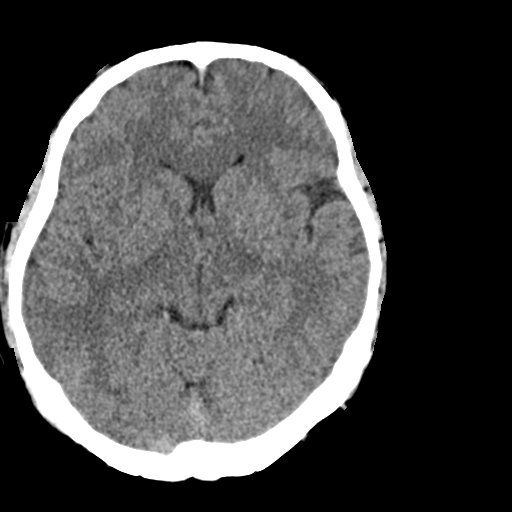
[im 14/31  brain]
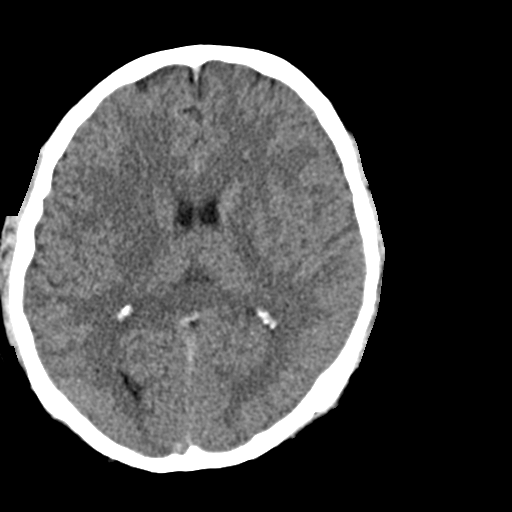
[im 14/31  bone]
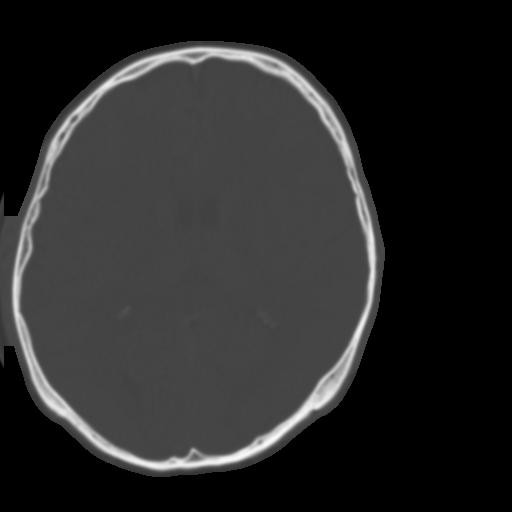
[im 17/31  brain]
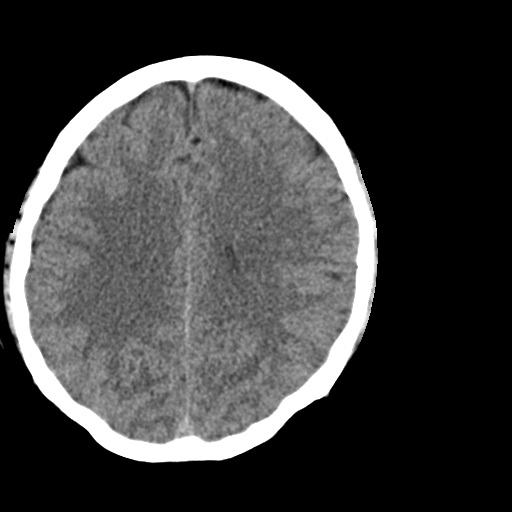
[im 20/31  brain]
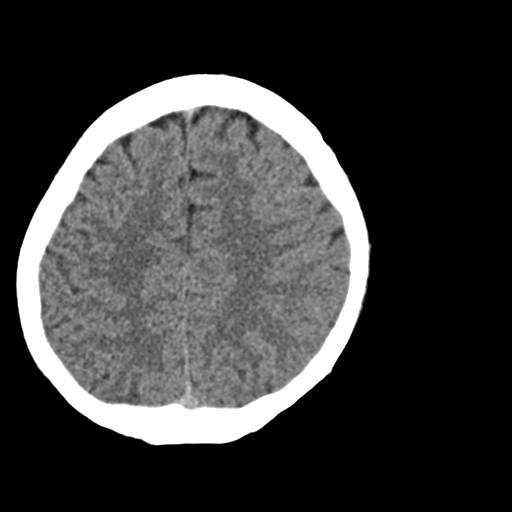
[im 23/31  brain]
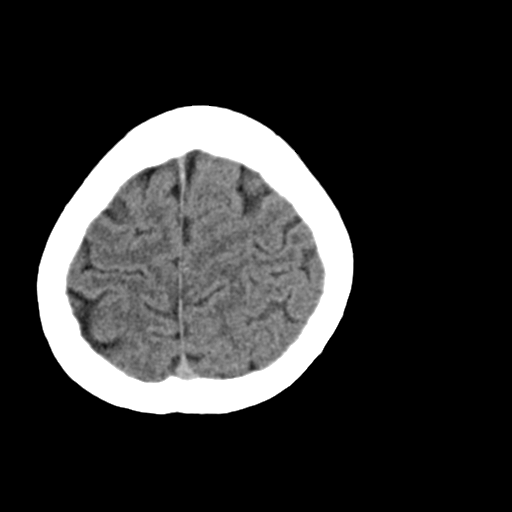
[im 25/31  brain]
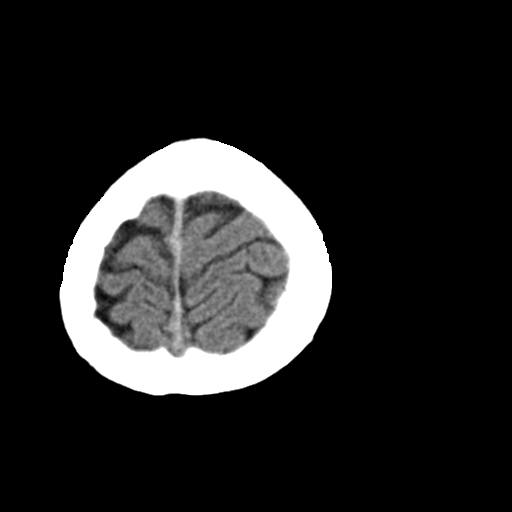
[im 25/31  bone]
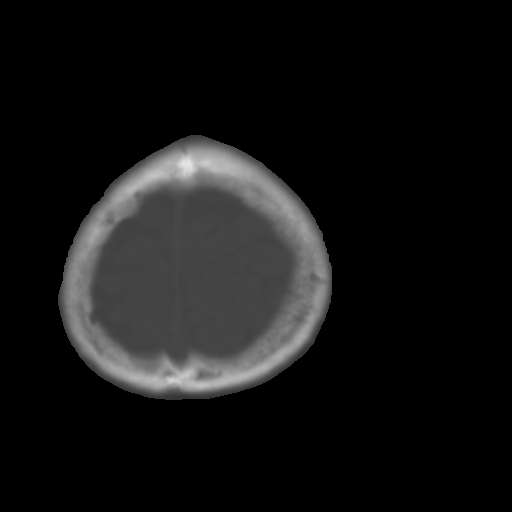
[im 28/31  brain]
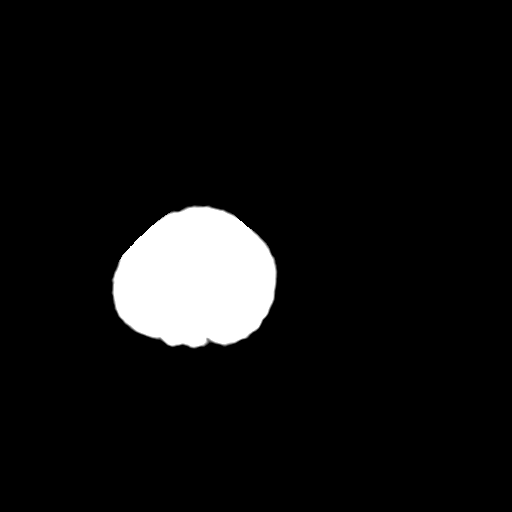

[Series 4: coronal soft · coronal · 0.31mm/px · 3 of 64 slices shown]
[im 22/64  brain]
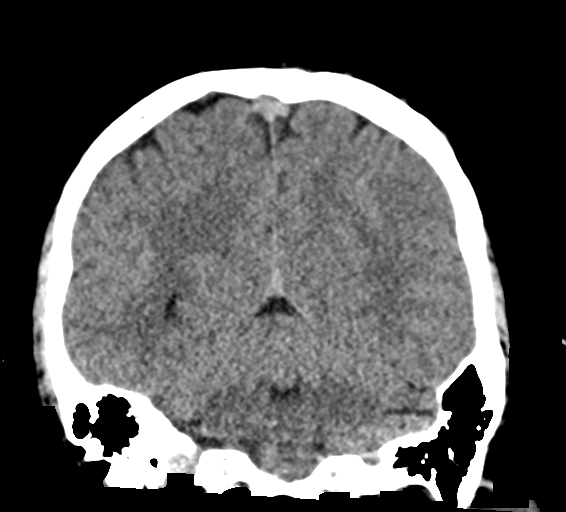
[im 29/64  brain]
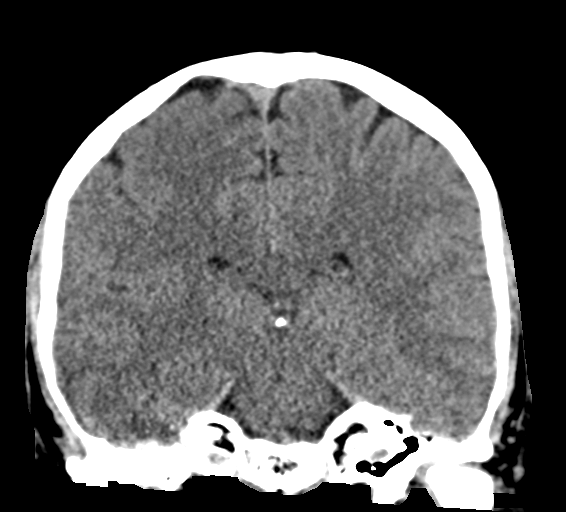
[im 36/64  brain]
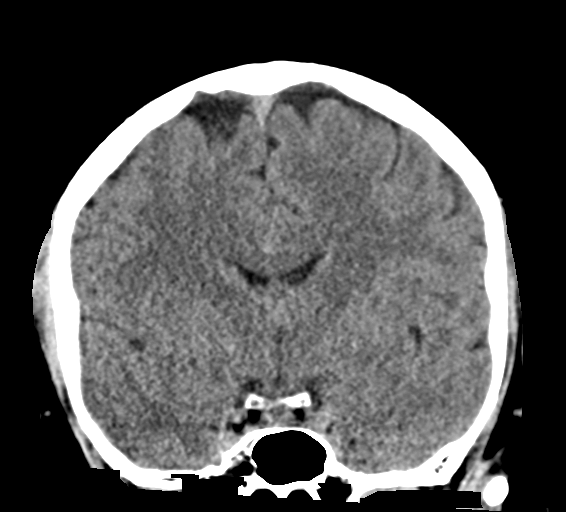

[Series 5: sag soft · sagittal · 0.31mm/px · 3 of 57 slices shown]
[im 19/57  brain]
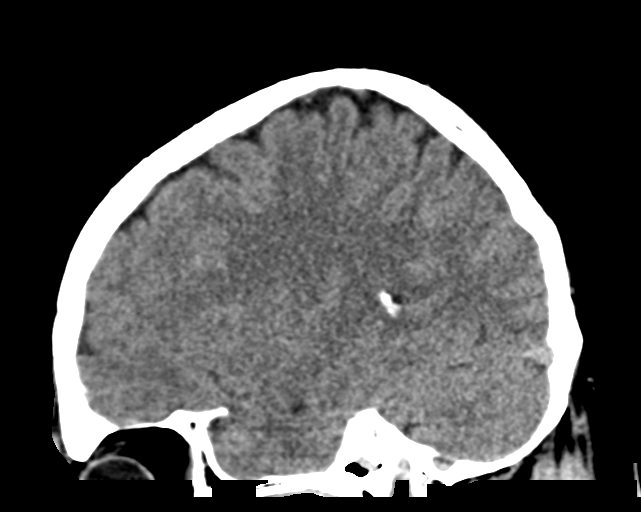
[im 29/57  brain]
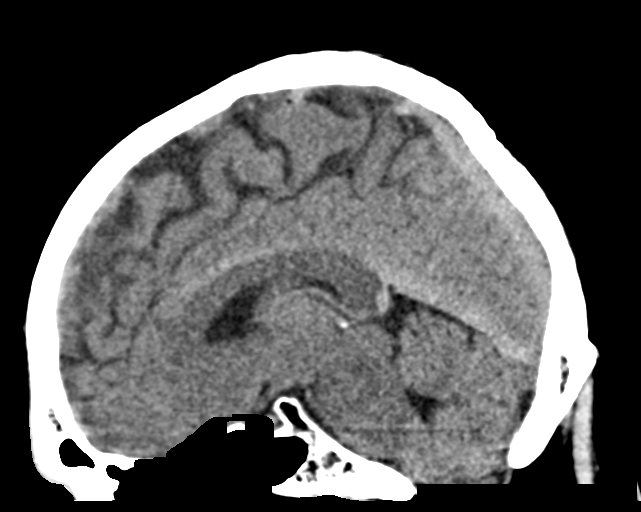
[im 38/57  brain]
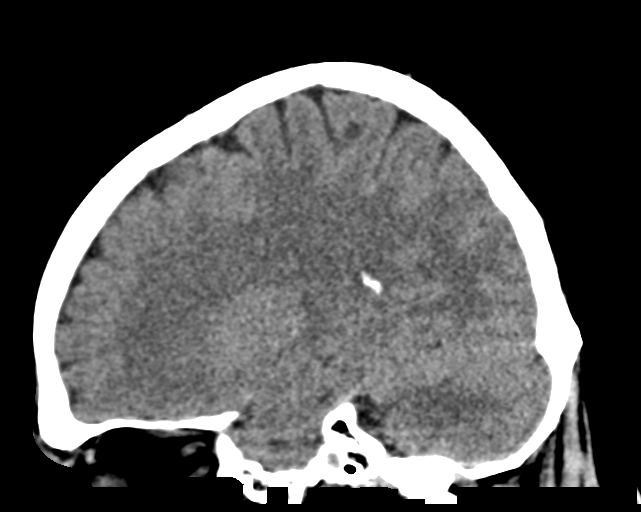

[16 of 47 positions shown; findings below may reference images not displayed]

FINDINGS: Brain: No acute intracranial abnormality. Specifically, no
hemorrhage, hydrocephalus, mass lesion, acute infarction, or
significant intracranial injury.

Vascular: No hyperdense vessel or unexpected calcification.

Skull: No acute calvarial abnormality.

Sinuses/Orbits: Visualized paranasal sinuses and mastoids clear.
Orbital soft tissues unremarkable.

Other: None
IMPRESSION: Normal study.

## 2019-01-15 MED FILL — ?METFORMIN HCL 500MG TABLET: 500 | 30 days supply | Qty: 30 | Fill #3

## 2019-01-15 MED FILL — FLUTICASONE PROP 50 MCG SPR: 50 | 30 days supply | Qty: 16 | Fill #3

## 2019-01-15 MED FILL — ?ATORVASTATIN 40MG TABLET: 40 | 30 days supply | Qty: 60 | Fill #3

## 2019-01-20 ENCOUNTER — Encounter (HOSPITAL_COMMUNITY): Payer: Self-pay | Admitting: *Deleted

## 2019-01-20 ENCOUNTER — Encounter (HOSPITAL_COMMUNITY): Payer: Self-pay

## 2019-01-22 ENCOUNTER — Encounter (HOSPITAL_COMMUNITY): Payer: Self-pay | Admitting: Optometry

## 2019-02-15 MED FILL — ?CETIRIZINE HCL 10 MG TABLE: 10 | 30 days supply | Qty: 30 | Fill #3

## 2019-02-15 MED FILL — FLUTICASONE PROP 50 MCG SPR: 50 | 30 days supply | Qty: 16 | Fill #4

## 2019-02-15 MED FILL — ?ATORVASTATIN 40MG TABLET: 40 | 30 days supply | Qty: 60 | Fill #4

## 2019-02-15 MED FILL — ?METFORMIN HCL 500MG TABLET: 500 | 30 days supply | Qty: 30 | Fill #4

## 2019-03-18 MED FILL — ?ATORVASTATIN 40MG TABLET: 40 | 30 days supply | Qty: 60 | Fill #5

## 2019-03-18 MED FILL — ?METFORMIN HCL 500MG TABLET: 500 | 30 days supply | Qty: 30 | Fill #5

## 2019-03-18 MED FILL — FLUTICASONE PROP 50 MCG SPR: 50 | 30 days supply | Qty: 16 | Fill #5

## 2019-03-18 MED FILL — ?CETIRIZINE HCL 10 MG TABLE: 10 | 30 days supply | Qty: 30 | Fill #4

## 2019-04-14 ENCOUNTER — Ambulatory Visit: Payer: Self-pay | Admitting: Family Medicine

## 2019-04-21 ENCOUNTER — Other Ambulatory Visit: Payer: Self-pay | Admitting: Family Medicine

## 2019-04-21 DIAGNOSIS — E785 Hyperlipidemia, unspecified: Secondary | ICD-10-CM

## 2019-04-21 MED FILL — ?METFORMIN HCL 500MG TABLET: 500 | 30 days supply | Qty: 30 | Fill #6

## 2019-04-21 MED FILL — FLUTICASONE PROP 50 MCG SPR: 50 | 30 days supply | Qty: 16 | Fill #6

## 2019-04-21 MED FILL — ?ATORVASTATIN 40MG TABLET: 40 | 30 days supply | Qty: 60 | Fill #0

## 2019-04-21 MED FILL — ?CETIRIZINE HCL 10 MG TABLE: 10 | 30 days supply | Qty: 30 | Fill #5

## 2019-04-21 MED FILL — ?METFORMIN HCL 500MG TABLET: 500 | 30 days supply | Qty: 30 | Fill #7

## 2019-04-21 MED FILL — ?CETIRIZINE HCL 10 MG TABLE: 10 | 30 days supply | Qty: 30 | Fill #6

## 2019-04-26 ENCOUNTER — Other Ambulatory Visit: Payer: Self-pay

## 2019-04-26 DIAGNOSIS — J301 Allergic rhinitis due to pollen: Secondary | ICD-10-CM

## 2019-04-26 MED ORDER — FLUTICASONE PROPIONATE 50 MCG/ACT NA SUSP: 2.0000 | Freq: Every day | NASAL | 6 refills | Status: DC

## 2019-06-22 MED FILL — metFORMIN HCL 500 MG TABS: 500 | 30 days supply | Qty: 30 | Fill #7

## 2019-06-29 ENCOUNTER — Encounter: Payer: Self-pay | Admitting: Family Medicine

## 2019-06-29 ENCOUNTER — Other Ambulatory Visit: Payer: Self-pay

## 2019-06-29 ENCOUNTER — Ambulatory Visit (INDEPENDENT_AMBULATORY_CARE_PROVIDER_SITE_OTHER): Payer: Self-pay | Admitting: Family Medicine

## 2019-06-29 VITALS — BP 124/78 | HR 58 | Temp 98.5°F | Resp 14 | Ht 69.0 in | Wt 194.0 lb

## 2019-06-29 DIAGNOSIS — Z8673 Personal history of transient ischemic attack (TIA), and cerebral infarction without residual deficits: Secondary | ICD-10-CM

## 2019-06-29 DIAGNOSIS — M542 Cervicalgia: Secondary | ICD-10-CM

## 2019-06-29 DIAGNOSIS — E119 Type 2 diabetes mellitus without complications: Secondary | ICD-10-CM

## 2019-06-29 DIAGNOSIS — E785 Hyperlipidemia, unspecified: Secondary | ICD-10-CM

## 2019-06-29 DIAGNOSIS — Z789 Other specified health status: Secondary | ICD-10-CM

## 2019-06-29 DIAGNOSIS — Z23 Encounter for immunization: Secondary | ICD-10-CM

## 2019-06-29 DIAGNOSIS — R5383 Other fatigue: Secondary | ICD-10-CM

## 2019-06-29 LAB — POCT URINALYSIS DIPSTICK
Bilirubin, UA: NEGATIVE
Blood, UA: NEGATIVE
Glucose, UA: NEGATIVE
Ketones, UA: NEGATIVE
Leukocytes, UA: NEGATIVE
Nitrite, UA: NEGATIVE
Protein, UA: NEGATIVE
Spec Grav, UA: 1.025 (ref 1.010–1.025)
Urobilinogen, UA: 0.2 E.U./dL
pH, UA: 5.5 (ref 5.0–8.0)

## 2019-06-29 LAB — POCT GLYCOSYLATED HEMOGLOBIN (HGB A1C): Hemoglobin A1C: 6.4 % — AB (ref 4.0–5.6)

## 2019-06-29 MED ORDER — METFORMIN HCL 500 MG PO TABS: 500.0000 mg | ORAL_TABLET | Freq: Every day | ORAL | 2 refills | Status: DC

## 2019-06-29 MED ORDER — MELOXICAM 7.5 MG PO TABS: 7.5000 mg | ORAL_TABLET | Freq: Every day | ORAL | 0 refills | Status: DC

## 2019-06-29 MED ORDER — ATORVASTATIN CALCIUM 40 MG PO TABS: ORAL_TABLET | ORAL | 3 refills | Status: DC

## 2019-06-29 MED FILL — ATORVASTATIN CALCIUM 40 MG: 40 | 30 days supply | Qty: 60 | Fill #0

## 2019-06-29 MED FILL — MELOXICAM 7.5 MG TABLET: 7.5 | 30 days supply | Qty: 30 | Fill #0

## 2019-06-29 NOTE — Patient Instructions (Signed)
Radiculopata cervical Cervical Radiculopathy  La radiculopata cervical se presenta cuando un nervio del cuello (un nervio cervical) est comprimido o daado. Esta afeccin puede ocurrir debido a una lesin en la columna vertebral cervical (vrtebras) del cuello, o como parte del proceso de envejecimiento normal. La compresin de los nervios cervicales puede causar dolor o adormecimiento que se extiende desde el cuello hasta el brazo y los dedos de la [place]. Generalmente, esta afeccin mejora con reposo. Si no mejora, tal vez sea necesario administrar un tratamiento. Cules son las causas? Esta afeccin puede ser causada por lo siguiente:  Lesin en el cuello.  Un abombamiento (hernia) discal.  Espasmos musculares.  Rigidez en el cuello debido al [employer].  Artritis.  Fractura o degeneracin de los huesos y las articulaciones de la columna (espondiloartrosis) debido al envejecimiento.  Espolones seos que pueden formarse cerca de los nervios cervicales. Cules son los signos o los sntomas? Los sntomas de esta afeccin incluyen:  Social research officer, government. El dolor puede extenderse desde el cuello hasta el brazo y [place]. El dolor puede ser intenso o [place]. Puede empeorar al mover el cuello.  Adormecimiento u hormigueo en el brazo o la mano.  Debilidad en el brazo y la mano afectados, en casos graves. Cmo se diagnostica? Esta afeccin se puede diagnosticar en funcin de los sntomas, la historia clnica y los antecedentes mdicos. Tambin pueden hacerle estudios, que incluyen los siguientes:  Radiografas.  Una exploracin por tomografa computarizada (TC).  Una resonancia magntica (RM).  Un electromiograma (EMG).  Pruebas de conduccin nerviosa. Cmo se trata? En muchos casos, no se requiere tratamiento para esta afeccin. Con reposo, esta suele mejorar con [employer]. Si es Arts development officer, las opciones pueden incluir lo siguiente:  El uso de un collarn  cervical blando (collarn cervical) durante perodos cortos, como se lo haya indicado el mdico.  Hacer fisioterapia para fortalecer los msculos del cuello.  Tomar medicamentos, como antiinflamatorios no esteroideos (AINE) o corticoesteroides por va oral.  Aplicarse inyecciones en la columna vertebral, en los casos graves.  Someterse a [place]. Esto puede ser necesario si otros tratamientos no son eficaces. Segn la causa de esta afeccin, podrn implementarse diferentes tipos de [place]. Siga estas instrucciones en su casa: Si tiene un collarn cervical:  selo como se lo haya indicado el mdico. Quteselo solamente como se lo haya indicado el mdico.  Pregntele al mdico si puede quitarse el collarn para baarse e higienizarse. Si lo autorizan a Programmer, systems para baarse o higienizarse: ? Siga las instrucciones del mdico acerca de cmo quitarse el collarn de manera segura. ? Para limpiar el collarn, psele un pao con agua y [place], y squelo bien. ? Quite las almohadillas desmontables del collarn, si las tiene, cada 1 o 2das y lvelas a [place] con agua y [place]. Djelas que se sequen por completo antes de volver a ponerlas en el collarn. ? Contrlese la piel debajo del collarn para ver si hay irritacin o llagas. Si presenta alguna de estas, informe a su mdico. Control del [employer] de venta libre y los recetados solamente como se lo haya indicado el mdico.  Si se lo indican, aplique hielo sobre la zona afectada. ? Si tiene un collarn cervical blando, quteselo como se lo haya indicado el mdico. ? Ponga el hielo en una bolsa plstica. ? Coloque una [employer] piel y Therapist, nutritional. ? Coloque el hielo durante 12minutos, 2 a 3veces  por Training and development officer.  Si aplicarse hielo no le Ship broker, intente Multimedia programmer. Use la fuente de calor que el mdico le recomiende, como una compresa de calor hmedo o una almohadilla trmica. ? Coloque  una [employer] piel y la fuente de Freight forwarder. ? Aplique calor durante 20 a 82minutos. ? Retire la fuente de calor si la piel se pone de color rojo brillante. Esto es especialmente importante si no puede sentir dolor, calor o fro. Puede correr un riesgo mayor de sufrir quemaduras.  Intente darse un masaje suave en el cuello y el hombro para ayudar a Public house manager los sntomas. Actividad  Descanse todo lo que sea necesario.  Retome sus actividades normales segn lo indicado por el mdico. Pregntele al mdico qu actividades son seguras para usted.  Realice ejercicios de elongacin y fortalecimiento como se lo hayan indicado el mdico o el fisioterapeuta.  No levante objetos que pesen ms de 10libras (4.5kg) hasta que el mdico le diga que es seguro. Instrucciones generales  Use una almohada plana para dormir.  No conduzca mientras [place] un collarn cervical. Si no tiene un collarn cervical, pregntele al mdico si es seguro que conduzca durante el proceso de curacin del cuello.  Pregntele al mdico si el medicamento recetado le impide conducir o usar maquinaria pesada.  No consuma ningn producto que contenga nicotina o tabaco, como cigarrillos, cigarrillos electrnicos y tabaco de Higher education careers adviser. Estos pueden retrasar la recuperacin. Si necesita ayuda para dejar de fumar, consulte al mdico.  Concurra a todas las visitas de seguimiento como se lo haya indicado el mdico. Esto es importante. Comunquese con un mdico si:  La afeccin no mejora con tratamiento. Solicite ayuda inmediatamente si:  El dolor se intensifica y no se Engineer, production con los medicamentos.  Siente debilidad o adormecimiento en la mano, el brazo, el rostro o la pierna.  Tiene fiebre alta.  Tiene rigidez de cuello.  Pierde el control de la vejiga o los intestinos (tiene incontinencia).  Tiene dificultad para caminar, mantener el equilibrio o hablar. Resumen  La radiculopata cervical se presenta cuando un nervio del  cuello est comprimido o daado.  Un nervio puede pinzarse por un abultamiento discal, artritis, espasmos musculares o una lesin en el cuello.  Los sntomas Research scientist (physical sciences), hormigueo o adormecimiento que se irradia desde el cuello hacia el brazo o la [place]. En los casos graves, tambin puede presentarse debilidad.  El tratamiento puede incluir reposo, fisioterapia y usar un collarn cervical. Pueden recetarle medicamentos para Best boy. En casos graves, tal vez haya que aplicar inyecciones o realizar [place]. Esta informacin no tiene Marine scientist el consejo del mdico. Asegrese de hacerle al mdico cualquier pregunta que tenga. Document Revised: 05/14/2018 Document Reviewed: 05/14/2018 Elsevier Patient Education  2020 [employer].

## 2019-06-29 NOTE — Progress Notes (Signed)
Patient [facility]   Established Patient Office Visit  Subjective:  Patient ID: Drew [name], male    DOB: March 22, 1976  Age: 44 y.o. MRN: [id number]  CC:  Chief Complaint  Patient presents with  . Diabetes  . Neck Pain    cramping on left side of neck that comes and goes    Drew [name], a 44 year old male with a medical history significant for type 2 diabetes mellitus, history of TIA, and history of right-sided weakness presents for follow-up and has a new complaint of left neck pain. Left neck pain is been present over the past several months. Patient primarily speaks Spanish, interpreter utilized for assistance with communication.  Diabetes He has type 2 (Patient is following up for type II DM, he has not been taking medications consistently, following a low-fat low carbohydrate diet, or exercising consistently.) diabetes mellitus. His disease course has been stable. Hypoglycemia symptoms include dizziness. Associated symptoms include polydipsia, polyphagia and polyuria. Pertinent negatives for diabetes include no visual change. He is compliant with treatment all of the time.  Neck Pain  Episode onset: Left neck pain is characterized as cramping and has been present over the past 3 months.  Patient has not sustained injury.  He has not attempted any over-the-counter interventions to assist with this problem. Progression since onset: Left neck pain occurs intermittently and is characterized as cramping. The pain is at a severity of 2/10. The pain is mild. Pertinent negatives include no visual change.     Past Medical History:  Diagnosis Date  . Facial nerve palsy   . Stroke ([place])   . Type 2 diabetes mellitus ([place])     Past Surgical History:  Procedure Laterality Date  . APPENDECTOMY      Family History  Problem Relation Age of Onset  . Stroke Father     Social History   Socioeconomic History  . Marital status: Married    Spouse name:  Not on file  . Number of children: Not on file  . Years of education: Not on file  . Highest education level: Not on file  Occupational History  . Not on file  Tobacco Use  . Smoking status: Former Research scientist (life sciences)  . Smokeless tobacco: Never Used  Substance and Sexual Activity  . Alcohol use: No  . Drug use: No  . Sexual activity: Not on file  Other Topics Concern  . Not on file  Social History Narrative  . Not on file   Social Determinants of Health   Financial Resource Strain:   . Difficulty of Paying Living Expenses: Not on file  Food Insecurity:   . Worried About Charity fundraiser in the Last Year: Not on file  . Ran Out of Food in the Last Year: Not on file  Transportation Needs:   . Lack of Transportation (Medical): Not on file  . Lack of Transportation (Non-Medical): Not on file  Physical Activity:   . Days of Exercise per Week: Not on file  . Minutes of Exercise per Session: Not on file  Stress:   . Feeling of Stress : Not on file  Social Connections:   . Frequency of Communication with Friends and Family: Not on file  . Frequency of Social Gatherings with Friends and Family: Not on file  . Attends Religious Services: Not on file  . Active Member of Clubs or Organizations: Not on file  . Attends Archivist Meetings: Not  on file  . Marital Status: Not on file  Intimate Partner Violence:   . Fear of Current or Ex-Partner: Not on file  . Emotionally Abused: Not on file  . Physically Abused: Not on file  . Sexually Abused: Not on file    Outpatient Medications Prior to Visit  Medication Sig Dispense Refill  . aspirin 81 MG EC tablet Take 1 tablet (81 mg total) by mouth daily. 90 tablet 3  . atorvastatin (LIPITOR) 40 MG tablet TAKE 2 TABLETS (80 MG TOTAL) BY MOUTH DAILY AT 6 PM. 60 tablet 3  . metFORMIN (GLUCOPHAGE) 500 MG tablet Take 1 tablet (500 mg total) by mouth daily with breakfast. 90 tablet 2  . cetirizine (ZYRTEC) 10 MG tablet Take 1 tablet (10 mg  total) by mouth daily. (Patient not taking: Reported on 06/29/2019) 30 tablet 11  . fluticasone (FLONASE) 50 MCG/ACT nasal spray Place 2 sprays into both nostrils daily. (Patient not taking: Reported on 06/29/2019) 16 g 6   No facility-administered medications prior to visit.    No Known Allergies  ROS Review of Systems  Constitutional: Negative.   HENT: Negative.   Respiratory: Negative.   Cardiovascular: Negative.   Gastrointestinal: Negative.   Endocrine: Positive for polydipsia, polyphagia and polyuria.  Musculoskeletal: Positive for neck pain.  Neurological: Positive for dizziness.  Hematological: Negative.   Psychiatric/Behavioral: Negative.       Objective:    Physical Exam  Constitutional: He is oriented to person, place, and time.  HENT:  Head: Normocephalic and atraumatic.  Eyes: Pupils are equal, round, and reactive to light.  Neck: No thyroid mass present.  Cardiovascular: Normal rate, regular rhythm and normal heart sounds.  Pulmonary/Chest: Effort normal and breath sounds normal.  Abdominal: Soft. Bowel sounds are normal.  Musculoskeletal:        General: Normal range of motion.     Cervical back: Normal range of motion and neck supple. No edema or rigidity. No muscular tenderness. Normal range of motion.  Neurological: He is alert and oriented to person, place, and time.  Skin: Skin is warm and dry.  Psychiatric: He has a normal mood and affect. His behavior is normal. Judgment and thought content normal.    BP 124/78 (BP Location: Right Arm, Patient Position: Sitting, Cuff Size: Large)   Pulse (!) 58   Temp 98.5 F (36.9 C) (Oral)   Resp 14   Ht 5\' 9"  (1.753 m)   Wt 194 lb (88 kg)   SpO2 100%   BMI 28.65 kg/m  Wt Readings from Last 3 Encounters:  06/29/19 194 lb (88 kg)  10/12/18 190 lb (86.2 kg)  06/08/18 194 lb (88 kg)     Health Maintenance Due  Topic Date Due  . OPHTHALMOLOGY EXAM  12/27/2018    There are no preventive care reminders  to display for this patient.  No results found for: TSH Lab Results  Component Value Date   WBC 7.4 12/17/2017   HGB 15.0 12/17/2017   HCT 41.2 12/17/2017   MCV 90.2 12/17/2017   PLT 236 12/17/2017   Lab Results  Component Value Date   NA 141 03/06/2018   K 4.4 03/06/2018   CO2 20 03/06/2018   GLUCOSE 120 (H) 03/06/2018   BUN 15 03/06/2018   CREATININE 0.83 03/06/2018   BILITOT 0.4 03/06/2018   ALKPHOS 91 03/06/2018   AST 31 03/06/2018   ALT 27 03/06/2018   PROT 6.9 03/06/2018   ALBUMIN 4.5 03/06/2018  CALCIUM 9.0 03/06/2018   ANIONGAP 11 12/17/2017   Lab Results  Component Value Date   CHOL 101 06/08/2018   Lab Results  Component Value Date   HDL 40 06/08/2018   Lab Results  Component Value Date   LDLCALC 45 06/08/2018   Lab Results  Component Value Date   TRIG 80 06/08/2018   Lab Results  Component Value Date   CHOLHDL 2.5 06/08/2018   Lab Results  Component Value Date   HGBA1C 6.4 (A) 06/29/2019      Assessment & Plan:   Problem List Items Addressed This Visit      Endocrine   Diabetes mellitus without complication ([place]) - Primary   Relevant Orders   HgB A1c (Completed)   Urinalysis Dipstick (Completed)      Diabetes mellitus without complication (HCC) Hemoglobin A1c is 6.4, which is increased from previous.  Discussed the importance of taking medications consistently in order to achieve positive outcomes.  Recommend low carbohydrate diet divided over small meals throughout the day.  Patient expressed understanding. - HgB A1c - Urinalysis Dipstick - Basic Metabolic Panel - Lipid Panel - metFORMIN (GLUCOPHAGE) 500 MG tablet; Take 1 tablet (500 mg total) by mouth daily with breakfast.  Dispense: 90 tablet; Refill: 2  Hyperlipidemia LDL goal <100 - atorvastatin (LIPITOR) 40 MG tablet; TAKE 2 TABLETS (80 MG TOTAL) BY MOUTH DAILY AT 6 PM.  Dispense: 60 tablet; Refill  language barrier to communication Utilize Spanish interpreter to assist  with communication.  History of stroke History of TIA, continue aspirin 81 mg daily and atorvastatin.  Also regular physical activity and low-fat diet.  Neck pain on left side Will start a trial of meloxicam 7.5 mg daily for mild to moderate neck pain. - meloxicam (MOBIC) 7.5 MG tablet; Take 1 tablet (7.5 mg total) by mouth daily.  Dispense: 30 tablet; Refill: 0  Influenza vaccine needed - Pneumococcal conjugate vaccine 13-valent  Other fatigue - CBC Follow-up: Return in about 3 months (around 09/26/2019) for diabetes.    [name]  APRN, MSN, FNP-C [facility] [address] [place], [place] [address] [phone]

## 2019-06-30 ENCOUNTER — Telehealth: Payer: Self-pay

## 2019-06-30 LAB — LIPID PANEL
Chol/HDL Ratio: 2.8 ratio (ref 0.0–5.0)
Cholesterol, Total: 111 mg/dL (ref 100–199)
HDL: 40 mg/dL (ref >39)
LDL Chol Calc (NIH): 56 mg/dL (ref 0–99)
Triglycerides: 75 mg/dL (ref 0–149)
VLDL Cholesterol Cal: 15 mg/dL (ref 5–40)

## 2019-06-30 LAB — BASIC METABOLIC PANEL
BUN/Creatinine Ratio: 20 (ref 9–20)
BUN: 15 mg/dL (ref 6–24)
CO2: 21 mmol/L (ref 20–29)
Calcium: 9.3 mg/dL (ref 8.7–10.2)
Chloride: 103 mmol/L (ref 96–106)
Creatinine, Ser: 0.76 mg/dL (ref 0.76–1.27)
GFR calc Af Amer: 130 mL/min/{1.73_m2} (ref >59)
GFR calc non Af Amer: 113 mL/min/{1.73_m2} (ref >59)
Glucose: 132 mg/dL — ABNORMAL HIGH (ref 65–99)
Potassium: 4.1 mmol/L (ref 3.5–5.2)
Sodium: 140 mmol/L (ref 134–144)

## 2019-06-30 LAB — CBC
Hematocrit: 42.7 % (ref 37.5–51.0)
Hemoglobin: 15 g/dL (ref 13.0–17.7)
MCH: 32.4 pg (ref 26.6–33.0)
MCHC: 35.1 g/dL (ref 31.5–35.7)
MCV: 92 fL (ref 79–97)
Platelets: 209 10*3/uL (ref 150–450)
RBC: 4.63 x10E6/uL (ref 4.14–5.80)
RDW: 13.1 % (ref 11.6–15.4)
WBC: 6.3 10*3/uL (ref 3.4–10.8)

## 2019-06-30 NOTE — Telephone Encounter (Signed)
-----   Message from Drew [name], [place] sent at 06/30/2019  6:22 AM EST ----- Regarding: lab results Please inform patient that all labs are within a normal range. Discuss the importance of taking prescribed medications consistently, increasing physical activity, and following a carbohydrate modified diet. Follow up in office as scheduled.   Drew Pounds  APRN, MSN, FNP-C [facility] [address] [place], [place] [address] [phone]

## 2019-06-30 NOTE — Telephone Encounter (Signed)
Called using [employer] [phone]. Spoke with patient, advised that labs are within normal limits and that he should continue taking medications consistently and increase physical activity and work on eating a crab modified diet. Asked that he keep next scheduled appointment. Patient verbalized understanding. Thanks!

## 2019-07-27 ENCOUNTER — Other Ambulatory Visit: Payer: Self-pay

## 2019-07-27 DIAGNOSIS — M542 Cervicalgia: Secondary | ICD-10-CM

## 2019-07-27 MED ORDER — MELOXICAM 7.5 MG PO TABS: 7.5000 mg | ORAL_TABLET | Freq: Every day | ORAL | 3 refills | Status: DC

## 2019-07-27 MED FILL — ?METFORMIN HCL 500MG TABLET: 500 | 30 days supply | Qty: 30 | Fill #8

## 2019-07-27 MED FILL — ?ATORVASTATIN 40MG TABLET: 40 | 30 days supply | Qty: 60 | Fill #1

## 2019-07-27 MED FILL — MELOXICAM 7.5 MG TABLET: 7.5 | 30 days supply | Qty: 30 | Fill #0

## 2019-08-27 MED FILL — METFORMIN HCL 500 MG TABS: 500 | 30 days supply | Qty: 30 | Fill #0

## 2019-08-27 MED FILL — ?ATORVASTATIN 40MG TABLET: 40 | 30 days supply | Qty: 60 | Fill #2

## 2019-09-03 ENCOUNTER — Emergency Department (HOSPITAL_COMMUNITY)
Admission: EM | Admit: 2019-09-03 | Discharge: 2019-09-03 | Disposition: A | Discharge status: Home / Self Care | Payer: Self-pay | Attending: Emergency Medicine | Admitting: Emergency Medicine

## 2019-09-03 ENCOUNTER — Other Ambulatory Visit: Payer: Self-pay

## 2019-09-03 ENCOUNTER — Encounter (HOSPITAL_COMMUNITY): Payer: Self-pay

## 2019-09-03 DIAGNOSIS — E119 Type 2 diabetes mellitus without complications: Secondary | ICD-10-CM | POA: Insufficient documentation

## 2019-09-03 DIAGNOSIS — Z7982 Long term (current) use of aspirin: Secondary | ICD-10-CM | POA: Insufficient documentation

## 2019-09-03 DIAGNOSIS — Z79899 Other long term (current) drug therapy: Secondary | ICD-10-CM | POA: Diagnosis not present

## 2019-09-03 DIAGNOSIS — R200 Anesthesia of skin: Secondary | ICD-10-CM | POA: Diagnosis not present

## 2019-09-03 DIAGNOSIS — M79604 Pain in right leg: Secondary | ICD-10-CM | POA: Diagnosis not present

## 2019-09-03 DIAGNOSIS — Z8673 Personal history of transient ischemic attack (TIA), and cerebral infarction without residual deficits: Secondary | ICD-10-CM | POA: Insufficient documentation

## 2019-09-03 DIAGNOSIS — Z87891 Personal history of nicotine dependence: Secondary | ICD-10-CM | POA: Diagnosis not present
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
## 2019-09-03 NOTE — Discharge Instructions (Addendum)
Return to ER if new symptoms develop or current symptoms worsen. Otherwise- recheck with your doctor.

## 2019-09-28 ENCOUNTER — Ambulatory Visit (INDEPENDENT_AMBULATORY_CARE_PROVIDER_SITE_OTHER): Payer: Self-pay | Admitting: Family Medicine

## 2019-09-28 ENCOUNTER — Other Ambulatory Visit: Payer: Self-pay

## 2019-09-28 ENCOUNTER — Encounter: Payer: Self-pay | Admitting: Family Medicine

## 2019-09-28 VITALS — BP 119/74 | HR 49 | Temp 98.4°F | Ht 66.0 in | Wt 190.0 lb

## 2019-09-28 DIAGNOSIS — M542 Cervicalgia: Secondary | ICD-10-CM

## 2019-09-28 DIAGNOSIS — E119 Type 2 diabetes mellitus without complications: Secondary | ICD-10-CM

## 2019-09-28 DIAGNOSIS — Z789 Other specified health status: Secondary | ICD-10-CM

## 2019-09-28 MED ORDER — MELOXICAM 7.5 MG PO TABS: 7.5000 mg | ORAL_TABLET | Freq: Every day | ORAL | 3 refills | Status: DC

## 2019-09-28 MED FILL — METFORMIN HCL 500 MG TABS: 500 | 30 days supply | Qty: 30 | Fill #1

## 2019-09-28 MED FILL — ?ATORVASTATIN 40MG TABLET: 40 | 30 days supply | Qty: 60 | Fill #3

## 2019-09-28 MED FILL — MELOXICAM 7.5 MG TABLET: 7.5 | 30 days supply | Qty: 30 | Fill #0

## 2019-09-28 NOTE — Progress Notes (Signed)
Patient [facility]   Established Patient Office Visit  Subjective:  Patient ID: Drew [name], male    DOB: Nov 24, 1975  Age: 44 y.o. MRN: [id number]  CC:  Chief Complaint  Patient presents with  . Follow-up    3 month follow up   . Diabetes    HPI Dartanyan Manolis is a 44 year old male with a medical history significant for type 2 diabetes mellitus, history of TIA, and history of right-sided weakness that presents for follow-up of chronic conditions.  Patient has been doing well and is without complaint.  Patient primarily speaking Spanish, using video interpreter for assistance with communication. Patient has history of type 2 diabetes mellitus.  He has been taking medications consistently.  He has been following a low-fat, carbohydrate modified diet over the past 6 months.  Patient does not exercise intentionally, however he works a very physical job.  His disease course has been stable.  He denies dizziness, shortness of breath, blurred vision, polyuria, polydipsia, or polyphagia.  Patient checks feet daily.  He is not under the care of podiatrist.  Patient has not established care with ophthalmology due to insurance constraints.  Past Medical History:  Diagnosis Date  . Facial nerve palsy   . Stroke ([place])   . Type 2 diabetes mellitus ([place])     Past Surgical History:  Procedure Laterality Date  . APPENDECTOMY      Family History  Problem Relation Age of Onset  . Stroke Father     Social History   Socioeconomic History  . Marital status: Married    Spouse name: Not on file  . Number of children: Not on file  . Years of education: Not on file  . Highest education level: Not on file  Occupational History  . Not on file  Tobacco Use  . Smoking status: Former Smoker    Types: Cigarettes  . Smokeless tobacco: Never Used  Substance and Sexual Activity  . Alcohol use: Yes    Comment: occ  . Drug use: No  . Sexual activity: Not on file   Other Topics Concern  . Not on file  Social History Narrative  . Not on file   Social Determinants of Health   Financial Resource Strain:   . Difficulty of Paying Living Expenses:   Food Insecurity:   . Worried About Charity fundraiser in the Last Year:   . Arboriculturist in the Last Year:   Transportation Needs:   . Film/video editor (Medical):   Marland Kitchen Lack of Transportation (Non-Medical):   Physical Activity:   . Days of Exercise per Week:   . Minutes of Exercise per Session:   Stress:   . Feeling of Stress :   Social Connections:   . Frequency of Communication with Friends and Family:   . Frequency of Social Gatherings with Friends and Family:   . Attends Religious Services:   . Active Member of Clubs or Organizations:   . Attends Archivist Meetings:   Marland Kitchen Marital Status:   Intimate Partner Violence:   . Fear of Current or Ex-Partner:   . Emotionally Abused:   Marland Kitchen Physically Abused:   . Sexually Abused:     Outpatient Medications Prior to Visit  Medication Sig Dispense Refill  . aspirin 81 MG EC tablet Take 1 tablet (81 mg total) by mouth daily. 90 tablet 3  . atorvastatin (LIPITOR) 40 MG tablet TAKE 2 TABLETS (  80 MG TOTAL) BY MOUTH DAILY AT 6 PM. 60 tablet 3  . cetirizine (ZYRTEC) 10 MG tablet Take 1 tablet (10 mg total) by mouth daily. 30 tablet 11  . fluticasone (FLONASE) 50 MCG/ACT nasal spray Place 2 sprays into both nostrils daily. 16 g 6  . metFORMIN (GLUCOPHAGE) 500 MG tablet Take 1 tablet (500 mg total) by mouth daily with breakfast. 90 tablet 2  . meloxicam (MOBIC) 7.5 MG tablet Take 1 tablet (7.5 mg total) by mouth daily. (Patient not taking: Reported on 09/28/2019) 30 tablet 3   No facility-administered medications prior to visit.    No Known Allergies  ROS Review of Systems  Constitutional: Negative for activity change and appetite change.  HENT: Negative.   Eyes: Negative.   Respiratory: Negative.   Cardiovascular: Negative.  Negative  for chest pain, palpitations and leg swelling.  Gastrointestinal: Negative.   Endocrine: Negative.   Genitourinary: Negative.   Musculoskeletal: Negative.   Skin: Negative.   Neurological: Negative.   Psychiatric/Behavioral: Negative.       Objective:    Physical Exam  Constitutional: He is oriented to person, place, and time. He appears well-developed and well-nourished.  HENT:  Head: Normocephalic.  Eyes: Pupils are equal, round, and reactive to light.  Cardiovascular: Normal rate and regular rhythm.  Pulmonary/Chest: Effort normal and breath sounds normal.  Abdominal: Soft. Bowel sounds are normal.  Musculoskeletal:        General: Normal range of motion.     Cervical back: Normal range of motion.  Neurological: He is alert and oriented to person, place, and time.  Skin: Skin is warm.    BP 119/74 (BP Location: Left Arm)   Pulse (!) 49   Temp 98.4 F (36.9 C)   Ht 5\' 6"  (1.676 m)   Wt 190 lb (86.2 kg)   SpO2 100%   BMI 30.67 kg/m  Wt Readings from Last 3 Encounters:  09/28/19 190 lb (86.2 kg)  09/03/19 188 lb (85.3 kg)  06/29/19 194 lb (88 kg)     Health Maintenance Due  Topic Date Due  . OPHTHALMOLOGY EXAM  12/27/2018  . URINE MICROALBUMIN  10/12/2019    There are no preventive care reminders to display for this patient.  No results found for: TSH Lab Results  Component Value Date   WBC 6.3 06/29/2019   HGB 15.0 06/29/2019   HCT 42.7 06/29/2019   MCV 92 06/29/2019   PLT 209 06/29/2019   Lab Results  Component Value Date   NA 140 06/29/2019   K 4.1 06/29/2019   CO2 21 06/29/2019   GLUCOSE 132 (H) 06/29/2019   BUN 15 06/29/2019   CREATININE 0.76 06/29/2019   BILITOT 0.4 03/06/2018   ALKPHOS 91 03/06/2018   AST 31 03/06/2018   ALT 27 03/06/2018   PROT 6.9 03/06/2018   ALBUMIN 4.5 03/06/2018   CALCIUM 9.3 06/29/2019   ANIONGAP 11 12/17/2017   Lab Results  Component Value Date   CHOL 111 06/29/2019   Lab Results  Component Value Date    HDL 40 06/29/2019   Lab Results  Component Value Date   LDLCALC 56 06/29/2019   Lab Results  Component Value Date   TRIG 75 06/29/2019   Lab Results  Component Value Date   CHOLHDL 2.8 06/29/2019   Lab Results  Component Value Date   HGBA1C 6.4 (A) 06/29/2019      Assessment & Plan:   Problem List Items Addressed This Visit  Endocrine   Diabetes mellitus without complication ([place])   Relevant Orders   Microalbumin/Creatinine Ratio, Urine    Other Visit Diagnoses    Type 2 diabetes mellitus without complication, without long-term current use of insulin ([place])    -  Primary   Relevant Orders   Basic Metabolic Panel   Urinalysis Dipstick   Hemoglobin A1c     Type 2 diabetes mellitus without complication, without long-term current use of insulin (HCC) Patient's hemoglobin A1c remains at goal.  Recommend that he continues a carbohydrate modified diet divided over 5-6 small meals per day.  Increase daily cardiovascular activity.  Also, increase water intake to around 32 ounces per day.  No medication changes warranted on today. - Basic Metabolic Panel - Urinalysis Dipstick - Hemoglobin A1c - Microalbumin/Creatinine Ratio, Urine   Neck pain on left side Left carotid - meloxicam (MOBIC) 7.5 MG tablet; Take 1 tablet (7.5 mg total) by mouth daily.  Dispense: 30 tablet; Refill: 3  Language barrier to communication: Using video interpreter to assist with communication.  Patient expresses understanding to care plan.      Follow-up: Return in about 6 months (around 03/30/2020).    [name]  APRN, MSN, FNP-C [facility] [address] [place], [place] [address] [phone]

## 2019-09-29 LAB — BASIC METABOLIC PANEL
BUN/Creatinine Ratio: 15 (ref 9–20)
BUN: 12 mg/dL (ref 6–24)
CO2: 20 mmol/L (ref 20–29)
Calcium: 9.3 mg/dL (ref 8.7–10.2)
Chloride: 107 mmol/L — ABNORMAL HIGH (ref 96–106)
Creatinine, Ser: 0.82 mg/dL (ref 0.76–1.27)
GFR calc Af Amer: 125 mL/min/{1.73_m2} (ref >59)
GFR calc non Af Amer: 108 mL/min/{1.73_m2} (ref >59)
Glucose: 134 mg/dL — ABNORMAL HIGH (ref 65–99)
Potassium: 4.8 mmol/L (ref 3.5–5.2)
Sodium: 141 mmol/L (ref 134–144)

## 2019-09-29 LAB — MICROALBUMIN / CREATININE URINE RATIO
Creatinine, Urine: 73.2 mg/dL
Microalb/Creat Ratio: <4 mg/g creat (ref 0–29)
Microalbumin, Urine: <3 ug/mL

## 2019-10-01 ENCOUNTER — Telehealth: Payer: Self-pay

## 2019-10-01 NOTE — Telephone Encounter (Signed)
-----   Message from Dorena Dew, [place] sent at 09/30/2019 12:54 PM EDT ----- Regarding: lab results Please inform patient that all laboratory values are within a normal range.  No medication changes warranted at this time.  Recommend that he continues to follow a low-carb, low-fat diet divided over small meals throughout the day.  Also, increase water intake.  Recommend regular physical activity.  Follow-up in office as scheduled.   [name]  APRN, MSN, FNP-C [facility] [address] [place], [place] [address] [phone]

## 2019-10-01 NOTE — Telephone Encounter (Signed)
Called and notified pt of his lab results, pt understood results without  any concerns.

## 2019-11-01 MED FILL — MELOXICAM 7.5 MG TABLET: 7.5 | 30 days supply | Qty: 30 | Fill #1

## 2019-11-01 MED FILL — ?ATORVASTATIN 40MG TABLET: 40 | 30 days supply | Qty: 60 | Fill #0

## 2019-11-01 MED FILL — METFORMIN HCL 500 MG TABS: 500 | 30 days supply | Qty: 30 | Fill #2

## 2019-12-01 MED FILL — METFORMIN HCL 500 MG TABS: 500 | 30 days supply | Qty: 30 | Fill #3

## 2019-12-01 MED FILL — MELOXICAM 7.5 MG TABLET: 7.5 | 30 days supply | Qty: 30 | Fill #2

## 2019-12-01 MED FILL — ?ATORVASTATIN 40MG TABLET: 40 | 30 days supply | Qty: 60 | Fill #1

## 2020-01-03 MED FILL — MELOXICAM 7.5 MG TABLET: 7.5 | 30 days supply | Qty: 30 | Fill #3

## 2020-01-03 MED FILL — ?METFORMIN HCL 500MG TABL: 500 | 30 days supply | Qty: 30 | Fill #4

## 2020-01-03 MED FILL — ?ATORVASTATIN 40MG TABLET: 40 | 30 days supply | Qty: 60 | Fill #2

## 2020-01-11 ENCOUNTER — Encounter (HOSPITAL_BASED_OUTPATIENT_CLINIC_OR_DEPARTMENT_OTHER): Payer: Self-pay

## 2020-01-11 ENCOUNTER — Other Ambulatory Visit: Payer: Self-pay

## 2020-01-11 ENCOUNTER — Emergency Department (HOSPITAL_BASED_OUTPATIENT_CLINIC_OR_DEPARTMENT_OTHER)
Admission: EM | Admit: 2020-01-11 | Discharge: 2020-01-11 | Disposition: A | Discharge status: Home / Self Care | Payer: Self-pay | Attending: Emergency Medicine | Admitting: Emergency Medicine

## 2020-01-11 DIAGNOSIS — Z87891 Personal history of nicotine dependence: Secondary | ICD-10-CM | POA: Insufficient documentation

## 2020-01-11 DIAGNOSIS — L539 Erythematous condition, unspecified: Secondary | ICD-10-CM | POA: Insufficient documentation

## 2020-01-11 DIAGNOSIS — I889 Nonspecific lymphadenitis, unspecified: Secondary | ICD-10-CM

## 2020-01-11 DIAGNOSIS — L0390 Cellulitis, unspecified: Secondary | ICD-10-CM

## 2020-01-11 DIAGNOSIS — Z79899 Other long term (current) drug therapy: Secondary | ICD-10-CM | POA: Insufficient documentation

## 2020-01-11 DIAGNOSIS — L02414 Cutaneous abscess of left upper limb: Secondary | ICD-10-CM | POA: Insufficient documentation

## 2020-01-11 DIAGNOSIS — Z7984 Long term (current) use of oral hypoglycemic drugs: Secondary | ICD-10-CM | POA: Insufficient documentation

## 2020-01-11 DIAGNOSIS — E119 Type 2 diabetes mellitus without complications: Secondary | ICD-10-CM | POA: Insufficient documentation

## 2020-01-11 MED ORDER — SULFAMETHOXAZOLE-TRIMETHOPRIM 800-160 MG PO TABS
1.0000 | ORAL_TABLET | Freq: Once | ORAL | Status: AC
  Administered 2020-01-11: 1 via ORAL
  Filled 2020-01-11: qty 1

## 2020-01-11 MED ORDER — SULFAMETHOXAZOLE-TRIMETHOPRIM 800-160 MG PO TABS
1.0000 | ORAL_TABLET | Freq: Two times a day (BID) | ORAL | 0 refills | Status: AC
Start: 2020-01-11 — End: 2020-01-18

## 2020-01-11 NOTE — ED Provider Notes (Signed)
[name] Provider Note   CSN: [id number] Arrival date & time: 01/11/20  1656     History Chief Complaint  Patient presents with  . Abscess    [name] is a 44 y.o. male.  The history is provided by the patient.  Illness Location:  Left medial arm  Quality:  Swollen, red  Severity:  Moderate Onset quality:  Gradual Duration:  4 days Timing:  Constant Progression:  Unchanged Chronicity:  New Context:  No known trauma Relieved by:  Nothing Worsened by:  Nothing  Ineffective treatments:  None tried  Associated symptoms: no abdominal pain, no congestion, no cough, no diarrhea and no fever   Risk factors:  None      Past Medical History:  Diagnosis Date  . Facial nerve palsy   . Stroke ([place])   . Type 2 diabetes mellitus [facility])     Patient Active Problem List   Diagnosis Date Noted  . HLD (hyperlipidemia) 12/18/2017  . Diabetes mellitus without complication ([place]) 99/24/2683  . TIA (transient ischemic attack) 12/18/2017  . Right-sided muscle weakness 05/08/2017  . Right sided numbness 05/08/2017  . Headache 05/08/2017  . Stroke-like symptoms 05/08/2017  . Non-English speaking patient 05/08/2017  . Weakness 05/07/2017    Past Surgical History:  Procedure Laterality Date  . APPENDECTOMY         Family History  Problem Relation Age of Onset  . Stroke Father     Social History   Tobacco Use  . Smoking status: Former Smoker    Types: Cigarettes  . Smokeless tobacco: Never Used  Vaping Use  . Vaping Use: Never used  Substance Use Topics  . Alcohol use: Yes    Comment: occ  . Drug use: No    Home Medications Prior to Admission medications   Medication Sig Start Date End Date Taking? Authorizing Provider  aspirin 81 MG EC tablet Take 1 tablet (81 mg total) by mouth daily. 10/12/18   Lanae Boast, FNP  atorvastatin (LIPITOR) 40 MG tablet TAKE 2 TABLETS (80 MG TOTAL) BY MOUTH DAILY AT 6 PM. 06/29/19   Dorena Dew, FNP  cetirizine (ZYRTEC) 10 MG tablet Take 1 tablet (10 mg total) by mouth daily. 10/12/18   Lanae Boast, FNP  fluticasone (FLONASE) 50 MCG/ACT nasal spray Place 2 sprays into both nostrils daily. 04/26/19   Tresa Garter, MD  meloxicam (MOBIC) 7.5 MG tablet Take 1 tablet (7.5 mg total) by mouth daily. 09/28/19   Dorena Dew, FNP  metFORMIN (GLUCOPHAGE) 500 MG tablet Take 1 tablet (500 mg total) by mouth daily with breakfast. 06/29/19   Dorena Dew, FNP    Allergies    Patient has no known allergies.  Review of Systems   Review of Systems  Constitutional: Negative for fever.  HENT: Negative for congestion.   Eyes: Negative for visual disturbance.  Respiratory: Negative for cough.   Gastrointestinal: Negative for abdominal pain and diarrhea.  Genitourinary: Negative for difficulty urinating.  Skin: Positive for color change.  Neurological: Negative for dizziness.  Psychiatric/Behavioral: Negative for agitation.  All other systems reviewed and are negative.   Physical Exam Updated Vital Signs BP 125/86 (BP Location: Right Arm)   Pulse (!) 48   Temp 98.2 F (36.8 C) (Oral)   Resp 16   Ht 5\' 8"  (1.727 m)   Wt 86.2 kg   SpO2 100%   BMI 28.89 kg/m   Physical Exam Vitals and nursing note  reviewed.  Constitutional:      General: He is not in acute distress.    Appearance: Normal appearance.  HENT:     Head: Normocephalic and atraumatic.     Nose: Nose normal.  Eyes:     Conjunctiva/sclera: Conjunctivae normal.     Pupils: Pupils are equal, round, and reactive to light.  Cardiovascular:     Rate and Rhythm: Normal rate and regular rhythm.     Pulses: Normal pulses.     Heart sounds: Normal heart sounds.  Pulmonary:     Effort: Pulmonary effort is normal.     Breath sounds: Normal breath sounds.  Abdominal:     General: Abdomen is flat. Bowel sounds are normal.     Tenderness: There is no abdominal tenderness. There is no guarding.    Musculoskeletal:     Cervical back: Normal range of motion and neck supple.  Skin:    General: Skin is warm.     Findings: Erythema present.       Neurological:     General: No focal deficit present.     Mental Status: He is alert and oriented to person, place, and time.  Psychiatric:        Mood and Affect: Mood normal.        Behavior: Behavior normal.     ED Results / Procedures / Treatments   Labs (all labs ordered are listed, but only abnormal results are displayed) Labs Reviewed - No data to display  EKG None  Radiology No results found.  Procedures Procedures (including critical care time)  Medications Ordered in ED Medications - No data to display  ED Course  I have reviewed the triage vital signs and the nursing notes.  Pertinent labs & imaging results that were available during my care of the patient were reviewed by me and considered in my medical decision making (see chart for details).    There is no abscess on [place], it is a lymph node.  Will treat with bactrim.  Have patient follow up for recheck in 2 days with PMD, strict return precautions given.   [name] was evaluated in Emergency Department on 01/11/2020 for the symptoms described in the history of present illness. He was evaluated in the context of the global COVID-19 pandemic, which necessitated consideration that the patient might be at risk for infection with the SARS-CoV-2 virus that causes COVID-19. Institutional protocols and algorithms that pertain to the evaluation of patients at risk for COVID-19 are in a state of rapid change based on information released by regulatory bodies including the CDC and federal and state organizations. These policies and algorithms were followed during the patient's care in the ED.  Final Clinical Impression(s) / ED Diagnoses Return for intractable cough, coughing up blood,fevers >100.4 unrelieved by medication, shortness of breath, intractable vomiting, chest  pain, shortness of breath, weakness,numbness, changes in speech, facial asymmetry,abdominal pain, passing out,Inability to tolerate liquids or food, cough, altered mental status or any concerns. No signs of systemic illness or infection. The patient is nontoxic-appearing on exam and vital signs are within normal limits.   I have reviewed the triage vital signs and the nursing notes. Pertinent labs &imaging results that were available during my care of the patient were reviewed by me and considered in my medical decision making (see chart for details).After history, exam, and medical workup I feel the patient has beenappropriately medically screened and is safe for discharge home. Pertinent diagnoses were discussed with  the patient. Patient was given return precautions.      Shaton Lore, MD 01/11/20 2320

## 2020-01-11 NOTE — ED Triage Notes (Addendum)
Pt c/o abscess to left UE x 4 days-redness/swelling noted left medial elbow area-denies injury-NAD-steady gait-adult son interpreting

## 2020-01-12 MED FILL — SULFAMETHOXAZOLE-TMP DS TAB: 800-160 | 7 days supply | Qty: 14 | Fill #0

## 2020-02-08 MED FILL — ?METFORMIN HCL 500MG TABL: 500 | 30 days supply | Qty: 30 | Fill #5

## 2020-02-08 MED FILL — ?ATORVASTATIN 40MG TABLET: 40 | 30 days supply | Qty: 60 | Fill #3

## 2020-03-09 MED FILL — METFORMIN HCL 500 MG TABS: 500 | 30 days supply | Qty: 30 | Fill #6

## 2020-03-28 ENCOUNTER — Other Ambulatory Visit: Payer: Self-pay

## 2020-03-28 ENCOUNTER — Other Ambulatory Visit: Payer: Self-pay | Admitting: Family Medicine

## 2020-03-28 ENCOUNTER — Ambulatory Visit (INDEPENDENT_AMBULATORY_CARE_PROVIDER_SITE_OTHER): Payer: Self-pay | Admitting: Family Medicine

## 2020-03-28 ENCOUNTER — Encounter: Payer: Self-pay | Admitting: Family Medicine

## 2020-03-28 VITALS — BP 119/91 | HR 57 | Temp 97.7°F | Resp 16 | Ht 66.0 in | Wt 190.0 lb

## 2020-03-28 DIAGNOSIS — E119 Type 2 diabetes mellitus without complications: Secondary | ICD-10-CM

## 2020-03-28 DIAGNOSIS — E785 Hyperlipidemia, unspecified: Secondary | ICD-10-CM

## 2020-03-28 DIAGNOSIS — Z789 Other specified health status: Secondary | ICD-10-CM

## 2020-03-28 DIAGNOSIS — Z23 Encounter for immunization: Secondary | ICD-10-CM

## 2020-03-28 LAB — POCT URINALYSIS DIPSTICK
Bilirubin, UA: NEGATIVE
Blood, UA: NEGATIVE
Glucose, UA: NEGATIVE
Ketones, UA: NEGATIVE
Leukocytes, UA: NEGATIVE
Nitrite, UA: NEGATIVE
Protein, UA: NEGATIVE
Spec Grav, UA: >=1.03 — AB (ref 1.010–1.025)
Urobilinogen, UA: 0.2 E.U./dL
pH, UA: 5.5 (ref 5.0–8.0)

## 2020-03-28 LAB — POCT GLYCOSYLATED HEMOGLOBIN (HGB A1C): Hemoglobin A1C: 6.3 % — AB (ref 4.0–5.6)

## 2020-03-28 LAB — GLUCOSE, POCT (MANUAL RESULT ENTRY): POC Glucose: 141 mg/dl — AB (ref 70–99)

## 2020-03-28 MED ORDER — ATORVASTATIN CALCIUM 40 MG PO TABS: ORAL_TABLET | ORAL | 3 refills | Status: DC

## 2020-03-28 MED ORDER — METFORMIN HCL 500 MG PO TABS: 500.0000 mg | ORAL_TABLET | Freq: Every day | ORAL | 2 refills | Status: DC

## 2020-03-28 MED ORDER — ASPIRIN 81 MG PO TBEC: 81.0000 mg | DELAYED_RELEASE_TABLET | Freq: Every day | ORAL | 3 refills | Status: DC

## 2020-03-28 MED FILL — ?ATORVASTATIN 40MG TABLET: 40 | 30 days supply | Qty: 60 | Fill #0

## 2020-03-28 NOTE — Progress Notes (Signed)
[facility]   Established Patient Office Visit  Subjective:  Patient ID: [name], male    DOB: 1975/12/11  Age: 44 y.o. MRN: [id number]  CC:  Chief Complaint  Patient presents with  . Follow-up    HPI [name] is a 44 year old male with a medical history significant for type 2 diabetes mellitus, history of TIA, and history of right-sided weakness was admitted for follow-up of chronic conditions.  Patient primarily speaks Spanish, interpreter utilized to assist with communication.  Patient states that he has been doing well.  His only complaint has been craving sweets for greater than 1 month.  Patient states that he has not been following a carbohydrate modified diet or exercising.  He says that he has been taking all medications consistently and without interruption.  He denies any headache, blurred vision, urinary symptoms, nausea, vomiting, or diarrhea.  No polyuria, polydipsia, or polyphagia.  Past Medical History:  Diagnosis Date  . Facial nerve palsy   . Stroke ([place])   . Type 2 diabetes mellitus ([place])     Past Surgical History:  Procedure Laterality Date  . APPENDECTOMY      Family History  Problem Relation Age of Onset  . Stroke Father     Social History   Socioeconomic History  . Marital status: Married    Spouse name: Not on file  . Number of children: Not on file  . Years of education: Not on file  . Highest education level: Not on file  Occupational History  . Not on file  Tobacco Use  . Smoking status: Former Smoker    Types: Cigarettes  . Smokeless tobacco: Never Used  Vaping Use  . Vaping Use: Never used  Substance and Sexual Activity  . Alcohol use: Yes    Comment: occ  . Drug use: No  . Sexual activity: Not on file  Other Topics Concern  . Not on file  Social History Narrative  . Not on file   Social Determinants of Health   Financial Resource Strain:   . Difficulty of Paying Living  Expenses: Not on file  Food Insecurity:   . Worried About Charity fundraiser in the Last Year: Not on file  . Ran Out of Food in the Last Year: Not on file  Transportation Needs:   . Lack of Transportation (Medical): Not on file  . Lack of Transportation (Non-Medical): Not on file  Physical Activity:   . Days of Exercise per Week: Not on file  . Minutes of Exercise per Session: Not on file  Stress:   . Feeling of Stress : Not on file  Social Connections:   . Frequency of Communication with Friends and Family: Not on file  . Frequency of Social Gatherings with Friends and Family: Not on file  . Attends Religious Services: Not on file  . Active Member of Clubs or Organizations: Not on file  . Attends Archivist Meetings: Not on file  . Marital Status: Not on file  Intimate Partner Violence:   . Fear of Current or Ex-Partner: Not on file  . Emotionally Abused: Not on file  . Physically Abused: Not on file  . Sexually Abused: Not on file    Outpatient Medications Prior to Visit  Medication Sig Dispense Refill  . aspirin 81 MG EC tablet Take 1 tablet (81 mg total) by mouth daily. 90 tablet 3  . atorvastatin (LIPITOR) 40  MG tablet TAKE 2 TABLETS (80 MG TOTAL) BY MOUTH DAILY AT 6 PM. 60 tablet 3  . cetirizine (ZYRTEC) 10 MG tablet Take 1 tablet (10 mg total) by mouth daily. 30 tablet 11  . fluticasone (FLONASE) 50 MCG/ACT nasal spray Place 2 sprays into both nostrils daily. 16 g 6  . meloxicam (MOBIC) 7.5 MG tablet Take 1 tablet (7.5 mg total) by mouth daily. 30 tablet 3  . metFORMIN (GLUCOPHAGE) 500 MG tablet Take 1 tablet (500 mg total) by mouth daily with breakfast. 90 tablet 2   No facility-administered medications prior to visit.    No Known Allergies  ROS Review of Systems  Constitutional: Negative.   HENT: Negative.   Eyes: Negative.   Respiratory: Negative.   Cardiovascular: Negative.   Gastrointestinal: Negative.   Genitourinary: Negative.     Musculoskeletal: Negative.   Neurological: Negative.   Hematological: Negative.   Psychiatric/Behavioral: Negative.       Objective:    Physical Exam Constitutional:      Appearance: Normal appearance.  Eyes:     Pupils: Pupils are equal, round, and reactive to light.  Cardiovascular:     Rate and Rhythm: Normal rate and regular rhythm.     Pulses: Normal pulses.  Abdominal:     General: Abdomen is flat. Bowel sounds are normal.  Musculoskeletal:        General: Normal range of motion.  Skin:    General: Skin is warm.  Neurological:     General: No focal deficit present.     Mental Status: He is alert. Mental status is at baseline.  Psychiatric:        Mood and Affect: Mood normal.        Behavior: Behavior normal.        Thought Content: Thought content normal.        Judgment: Judgment normal.     There were no vitals taken for this visit. Wt Readings from Last 3 Encounters:  01/11/20 190 lb (86.2 kg)  09/28/19 190 lb (86.2 kg)  09/03/19 188 lb (85.3 kg)     Health Maintenance Due  Topic Date Due  . Hepatitis C Screening  Never done  . FOOT EXAM  10/12/2019  . OPHTHALMOLOGY EXAM  12/14/2019  . HEMOGLOBIN A1C  12/27/2019    There are no preventive care reminders to display for this patient.  No results found for: TSH Lab Results  Component Value Date   WBC 6.3 06/29/2019   HGB 15.0 06/29/2019   HCT 42.7 06/29/2019   MCV 92 06/29/2019   PLT 209 06/29/2019   Lab Results  Component Value Date   NA 141 09/28/2019   K 4.8 09/28/2019   CO2 20 09/28/2019   GLUCOSE 134 (H) 09/28/2019   BUN 12 09/28/2019   CREATININE 0.82 09/28/2019   BILITOT 0.4 03/06/2018   ALKPHOS 91 03/06/2018   AST 31 03/06/2018   ALT 27 03/06/2018   PROT 6.9 03/06/2018   ALBUMIN 4.5 03/06/2018   CALCIUM 9.3 09/28/2019   ANIONGAP 11 12/17/2017   Lab Results  Component Value Date   CHOL 111 06/29/2019   Lab Results  Component Value Date   HDL 40 06/29/2019   Lab  Results  Component Value Date   LDLCALC 56 06/29/2019   Lab Results  Component Value Date   TRIG 75 06/29/2019   Lab Results  Component Value Date   CHOLHDL 2.8 06/29/2019   Lab Results  Component Value Date  HGBA1C 6.4 (A) 06/29/2019      Assessment & Plan:   Problem List Items Addressed This Visit    None    Visit Diagnoses    Type 2 diabetes mellitus without complication, without long-term current use of insulin ([place])    -  Primary   Relevant Orders   Glucose (CBG)   HgB A1c   Urinalysis Dipstick     1. Type 2 diabetes mellitus without complication, without long-term current use of insulin (H[facility]) Hemoglobin A1c is 6.3, improved from previous.  Patient advised to continue to follow a carbohydrate modified diet. - Glucose (CBG) - HgB A1c - Urinalysis Dipstick - Comprehensive metabolic panel  2. Diabetes mellitus without complication ([facility]) - metFORMIN (GLUCOPHAGE) 500 MG tablet; Take 1 tablet (500 mg total) by mouth daily with breakfast.  Dispense: 90 tablet; Refill: 2 - aspirin 81 MG EC tablet; Take 1 tablet (81 mg total) by mouth daily.  Dispense: 90 tablet; Refill: 3  3. Hyperlipidemia LDL goal <100 - atorvastatin (LIPITOR) 40 MG tablet; TAKE 2 TABLETS (80 MG TOTAL) BY MOUTH DAILY AT 6 PM.  Dispense: 60 tablet; Refill: 3 - aspirin 81 MG EC tablet; Take 1 tablet (81 mg total) by mouth daily.  Dispense: 90 tablet; Refill: 3  4. Need for prophylactic vaccination and inoculation against influenza - Flu Vaccine QUAD 36+ mos IM (Fluarix, Fluzone & Afluria Quad PF  5. Language barrier to communication Interpreter used assist with communication  Follow-up: Return in about 6 months (around 09/25/2020).    [name]  APRN, MSN, FNP-C Patient [facility] [address] [place], [place] [address] [phone]

## 2020-03-29 LAB — COMPREHENSIVE METABOLIC PANEL
ALT: 29 IU/L (ref 0–44)
AST: 25 IU/L (ref 0–40)
Albumin/Globulin Ratio: 2.2 (ref 1.2–2.2)
Albumin: 4.6 g/dL (ref 4.0–5.0)
Alkaline Phosphatase: 93 IU/L (ref 44–121)
BUN/Creatinine Ratio: 16 (ref 9–20)
BUN: 12 mg/dL (ref 6–24)
Bilirubin Total: 0.5 mg/dL (ref 0.0–1.2)
CO2: 22 mmol/L (ref 20–29)
Calcium: 9.4 mg/dL (ref 8.7–10.2)
Chloride: 107 mmol/L — ABNORMAL HIGH (ref 96–106)
Creatinine, Ser: 0.76 mg/dL (ref 0.76–1.27)
GFR calc Af Amer: 129 mL/min/{1.73_m2} (ref >59)
GFR calc non Af Amer: 112 mL/min/{1.73_m2} (ref >59)
Globulin, Total: 2.1 g/dL (ref 1.5–4.5)
Glucose: 122 mg/dL — ABNORMAL HIGH (ref 65–99)
Potassium: 4.4 mmol/L (ref 3.5–5.2)
Sodium: 140 mmol/L (ref 134–144)
Total Protein: 6.7 g/dL (ref 6.0–8.5)

## 2020-03-31 ENCOUNTER — Telehealth: Payer: Self-pay

## 2020-03-31 NOTE — Telephone Encounter (Signed)
-----   Message from Dorena Dew, [place] sent at 03/31/2020  6:42 AM EST ----- Regarding: lab results Please inform patient that all laboratory values are within a normal range.  No further medication changes are warranted at this time.  It is very important that patient follows L low carbohydrate diet divided over small meals throughout the day.  Recommend 150 minutes of exercise per week.  Also increase daily water intake.  Follow-up in clinic as scheduled.  [name]  APRN, MSN, FNP-C [facility] [address] [place],  [address] [phone]

## 2020-03-31 NOTE — Telephone Encounter (Signed)
ATC pt via [address] [place], [place]), no answer, LM to [facility], CRM created for follow-up.

## 2020-04-05 NOTE — Telephone Encounter (Signed)
Pt aware of results via [address] [place], [place]) and voiced understanding, all questions answered/concerns addressed.

## 2020-04-10 MED FILL — METFORMIN HCL 500 MG TABS: 500 | 30 days supply | Qty: 30 | Fill #7

## 2020-05-01 MED FILL — ?ATORVASTATIN 40MG TABLET: 40 | 30 days supply | Qty: 60 | Fill #1

## 2020-05-04 MED FILL — METFORMIN HCL 500 MG TABS: 500 | 30 days supply | Qty: 30 | Fill #8

## 2020-05-10 MED FILL — ?ATORVASTATIN 40MG TABLET: 40 | 30 days supply | Qty: 60 | Fill #1

## 2020-06-12 MED FILL — METFORMIN HCL 500 MG TABS: 500 | 30 days supply | Qty: 30 | Fill #0

## 2020-06-12 MED FILL — ?ATORVASTATIN 40MG TABLET: 40 | 30 days supply | Qty: 60 | Fill #2

## 2020-06-28 ENCOUNTER — Other Ambulatory Visit: Payer: Self-pay

## 2020-07-11 MED FILL — METFORMIN HCL 500 MG TABS: 500 | 30 days supply | Qty: 30 | Fill #1

## 2020-07-11 MED FILL — ?ATORVASTATIN 40MG TABLET: 40 | 30 days supply | Qty: 60 | Fill #3

## 2020-08-11 MED FILL — METFORMIN HCL 500 MG TABS: 500 | 30 days supply | Qty: 30 | Fill #2

## 2020-08-22 ENCOUNTER — Encounter (HOSPITAL_BASED_OUTPATIENT_CLINIC_OR_DEPARTMENT_OTHER): Payer: Self-pay

## 2020-08-22 ENCOUNTER — Emergency Department (HOSPITAL_BASED_OUTPATIENT_CLINIC_OR_DEPARTMENT_OTHER)
Admission: EM | Admit: 2020-08-22 | Discharge: 2020-08-22 | Disposition: A | Discharge status: Home / Self Care | Payer: Self-pay | Attending: Emergency Medicine | Admitting: Emergency Medicine

## 2020-08-22 ENCOUNTER — Other Ambulatory Visit: Payer: Self-pay

## 2020-08-22 ENCOUNTER — Emergency Department (HOSPITAL_BASED_OUTPATIENT_CLINIC_OR_DEPARTMENT_OTHER): Payer: Self-pay

## 2020-08-22 DIAGNOSIS — S61214A Laceration without foreign body of right ring finger without damage to nail, initial encounter: Secondary | ICD-10-CM | POA: Insufficient documentation

## 2020-08-22 DIAGNOSIS — Y9389 Activity, other specified: Secondary | ICD-10-CM | POA: Insufficient documentation

## 2020-08-22 DIAGNOSIS — Z7984 Long term (current) use of oral hypoglycemic drugs: Secondary | ICD-10-CM | POA: Insufficient documentation

## 2020-08-22 DIAGNOSIS — W312XXA Contact with powered woodworking and forming machines, initial encounter: Secondary | ICD-10-CM | POA: Insufficient documentation

## 2020-08-22 DIAGNOSIS — Z7982 Long term (current) use of aspirin: Secondary | ICD-10-CM | POA: Insufficient documentation

## 2020-08-22 DIAGNOSIS — E119 Type 2 diabetes mellitus without complications: Secondary | ICD-10-CM | POA: Insufficient documentation

## 2020-08-22 DIAGNOSIS — Z87891 Personal history of nicotine dependence: Secondary | ICD-10-CM | POA: Insufficient documentation

## 2020-08-22 MED ORDER — CEPHALEXIN 250 MG PO CAPS
250.0000 mg | ORAL_CAPSULE | Freq: Once | ORAL | Status: AC
  Administered 2020-08-22: 250 mg via ORAL
  Filled 2020-08-22: qty 1

## 2020-08-22 MED ORDER — ACETAMINOPHEN 325 MG PO TABS
650.0000 mg | ORAL_TABLET | Freq: Once | ORAL | Status: AC
  Administered 2020-08-22: 650 mg via ORAL
  Filled 2020-08-22: qty 2

## 2020-08-22 MED ORDER — CEPHALEXIN 500 MG PO CAPS
500.0000 mg | ORAL_CAPSULE | Freq: Two times a day (BID) | ORAL | 0 refills | Status: AC
  Filled 2020-08-22: qty 14, 7d supply, fill #0

## 2020-08-22 MED ORDER — LIDOCAINE-EPINEPHRINE (PF) 2 %-1:200000 IJ SOLN
20.0000 mL | Freq: Once | INTRAMUSCULAR | Status: AC
  Administered 2020-08-22: 20 mL
  Filled 2020-08-22: qty 20

## 2020-08-22 NOTE — ED Provider Notes (Signed)
[facility] EMERGENCY DEPARTMENT Provider Note   CSN: [id number] Arrival date & time: 08/22/20  1420     History Chief Complaint  Patient presents with  . Finger Injury    [name] is a 45 y.o. male.  HPI 45 year old male with a history of DM type II, facial nerve palsy presents to the ER with a laceration to his right ring finger.  Patient states he was cutting some wood and accidentally let his hand slipped under the saw.  Bleeding controlled on arrival.  Denies any numbness or tingling.  Denies any blood thinners.  He does endorse history of diabetes.  He reports that his last tetanus shot was approximately a year ago.    Past Medical History:  Diagnosis Date  . Facial nerve palsy   . Stroke ([place])   . Type 2 diabetes mellitus [facility])     Patient Active Problem List   Diagnosis Date Noted  . HLD (hyperlipidemia) 12/18/2017  . Diabetes mellitus without complication ([place]) 32/67/1245  . TIA (transient ischemic attack) 12/18/2017  . Right-sided muscle weakness 05/08/2017  . Right sided numbness 05/08/2017  . Headache 05/08/2017  . Stroke-like symptoms 05/08/2017  . Non-English speaking patient 05/08/2017  . Weakness 05/07/2017    Past Surgical History:  Procedure Laterality Date  . APPENDECTOMY         Family History  Problem Relation Age of Onset  . Stroke Father     Social History   Tobacco Use  . Smoking status: Former Smoker    Types: Cigarettes  . Smokeless tobacco: Never Used  Vaping Use  . Vaping Use: Never used  Substance Use Topics  . Alcohol use: Yes    Comment: weekly  . Drug use: No    Home Medications Prior to Admission medications   Medication Sig Start Date End Date Taking? Authorizing Provider  cephALEXin (KEFLEX) 500 MG capsule Take 1 capsule (500 mg total) by mouth 2 (two) times daily for 7 days. 08/22/20 08/29/20 Yes Garald Balding, PA-C  aspirin 81 MG EC tablet Take 1 tablet (81 mg total) by mouth daily. 03/28/20    Dorena Dew, FNP  atorvastatin (LIPITOR) 40 MG tablet TAKE 2 TABLETS (80 MG TOTAL) BY MOUTH DAILY AT 6 PM. 03/28/20 03/28/21  Dorena Dew, FNP  cetirizine (ZYRTEC) 10 MG tablet Take 1 tablet (10 mg total) by mouth daily. Patient not taking: Reported on 03/28/2020 10/12/18   Lanae Boast, FNP  fluticasone [facility]) 50 MCG/ACT nasal spray Place 2 sprays into both nostrils daily. Patient not taking: Reported on 03/28/2020 04/26/19   Tresa Garter, MD  meloxicam (MOBIC) 7.5 MG tablet Take 1 tablet (7.5 mg total) by mouth daily. 09/28/19   Dorena Dew, FNP  metFORMIN (GLUCOPHAGE) 500 MG tablet TAKE 1 TABLET (500 MG TOTAL) BY MOUTH DAILY WITH BREAKFAST. 03/28/20 03/28/21  Dorena Dew, FNP    Allergies    Patient has no known allergies.  Review of Systems   Review of Systems  Skin: Positive for color change and wound.    Physical Exam Updated Vital Signs BP 131/88 (BP Location: Right Arm)   Pulse 72   Temp 98.2 F (36.8 C) (Oral)   Resp 18   Wt 90.7 kg   SpO2 100%   BMI 32.28 kg/m   Physical Exam Vitals and nursing note reviewed.  Constitutional:      Appearance: He is well-developed.  HENT:     Head: Normocephalic and atraumatic.  Eyes:     Conjunctiva/sclera: Conjunctivae normal.  Cardiovascular:     Rate and Rhythm: Normal rate and regular rhythm.     Heart sounds: No murmur heard.   Pulmonary:     Effort: Pulmonary effort is normal. No respiratory distress.     Breath sounds: Normal breath sounds.  Abdominal:     Palpations: Abdomen is soft.     Tenderness: There is no abdominal tenderness.  Musculoskeletal:        General: Signs of injury present. Normal range of motion.     Cervical back: Neck supple.  Skin:    General: Skin is warm and dry.     Findings: Erythema and lesion present.     Comments: 2 cm x 1 mm deep laceration to the pad of the DIP.  Bleeding controlled.  No visible foreign bodies.  Sensations intact.  Full flexion  extension of the DIP and PIP joint.  Neurological:     General: No focal deficit present.     Mental Status: He is alert and oriented to person, place, and time.  Psychiatric:        Mood and Affect: Mood normal.        Behavior: Behavior normal.     ED Results / Procedures / Treatments   Labs (all labs ordered are listed, but only abnormal results are displayed) Labs Reviewed - No data to display  EKG None  Radiology DG Finger Ring Left  Result Date: 08/22/2020 CLINICAL DATA:  Laceration of left fourth finger. EXAM: LEFT RING FINGER 2+V COMPARISON:  None. FINDINGS: There is no evidence of fracture or dislocation. There is no evidence of arthropathy or other focal bone abnormality. Small laceration is seen involving distal tip of the finger. IMPRESSION: No fracture or dislocation is noted. No radiopaque foreign body is noted. Electronically Signed   By: Marijo Conception M.D.   On: 08/22/2020 15:08    Procedures .Marland KitchenLaceration Repair  Date/Time: 08/22/2020 5:12 PM Performed by: Garald Balding, PA-C Authorized by: Garald Balding, PA-C   Consent:    Consent obtained:  Verbal   Consent given by:  Patient   Risks discussed:  Infection, need for additional repair, pain, poor cosmetic result and poor wound healing   Alternatives discussed:  No treatment and delayed treatment Universal protocol:    Procedure explained and questions answered to patient or proxy's satisfaction: yes     Relevant documents present and verified: yes     Test results available: yes     Imaging studies available: yes     Required blood products, implants, devices, and special equipment available: yes     Site/side marked: yes     Immediately prior to procedure, a time out was called: yes     Patient identity confirmed:  Verbally with patient Anesthesia:    Anesthesia method:  Local infiltration   Local anesthetic:  Lidocaine 2% WITH epi Laceration details:    Location:  Finger   Finger location:  R  ring finger   Length (cm):  2   Depth (mm):  1 Exploration:    Imaging obtained: x-ray     Imaging outcome: foreign body not noted     Wound exploration: wound explored through full range of motion     Wound extent: no underlying fracture noted   Treatment:    Area cleansed with:  Povidone-iodine   Amount of cleaning:  Standard   Irrigation solution:  Sterile saline  Irrigation volume:  30   Irrigation method:  Syringe Skin repair:    Repair method:  Sutures   Suture size:  5-0   Suture material:  Prolene   Suture technique:  Simple interrupted   Number of sutures:  3 Approximation:    Approximation:  Close Repair type:    Repair type:  Simple Post-procedure details:    Dressing:  Non-adherent dressing   Procedure completion:  Tolerated well, no immediate complications     Medications Ordered in ED Medications  acetaminophen (TYLENOL) tablet 650 mg (has no administration in time range)  cephALEXin (KEFLEX) capsule 250 mg (has no administration in time range)  lidocaine-EPINEPHrine (XYLOCAINE W/EPI) 2 %-1:200000 (PF) injection 20 mL (20 mLs Infiltration Given 08/22/20 1628)    ED Course  I have reviewed the triage vital signs and the nursing notes.  Pertinent labs & imaging results that were available during my care of the patient were reviewed by me and considered in my medical decision making (see chart for details).    MDM Rules/Calculators/A&P                          45 year old male with a laceration to the DIP of his right ring finger.  Bleeding controlled.  Plain films without evidence of underlying fractures or foreign bodies.  Neurovascularly intact.  Laceration repaired without any complications.  Patient with a history of diabetes, will start on prophylactic antibiotics.  Patient was instructed to have sutures removed in 7 to 10 days.  Educated on signs of infection.  Given a dose of antibiotics here.  Return precautions discussed.  He voiced her standing and  is agreeable.  Stable for discharge.   Final Clinical Impression(s) / ED Diagnoses Final diagnoses:  Laceration of right ring finger without foreign body without damage to nail, initial encounter    Rx / DC Orders ED Discharge Orders         Ordered    cephALEXin (KEFLEX) 500 MG capsule  2 times daily        08/22/20 1714           Garald Balding, PA-C 08/22/20 1716    Lennice Sites, DO 08/22/20 2219

## 2020-08-22 NOTE — Discharge Instructions (Addendum)
Mantenga el rea limpia lavndola con agua y jabn diariamente. No sumerja en agua ni frote los puntos Aplique un vendaje al menos una vez al da, cmbielo con ms frecuencia si est sucio Est atento a signos de infeccin (enrojecimiento, drenaje, empeoramiento del dolor) Tome Tylenol o ibuprofeno para Conservation officer, historic buildings segn sea necesario Asegrese de tomar el antibitico segn las indicaciones hasta que termine. Quitarle los puntos en 7-10 das   Keep area clean by washing with soap and water daily. Do not submerge in water or scrub stitches Apply a bandage at least once daily, change more often if it is dirty Watch for signs of infection (redness, drainage, worsening pain) Take Tylenol or Ibuprofen for pain as needed Make sure to take the antibiotic as directed until finished. Have stitches removed in 7-10 days

## 2020-08-22 NOTE — ED Triage Notes (Signed)
With spanish interpreter-Pt cut left ring finger with a saw ~45 min PTA-lac noted-dsg applied in triage-NAD-steady gait

## 2020-08-23 ENCOUNTER — Other Ambulatory Visit: Payer: Self-pay

## 2020-09-26 ENCOUNTER — Ambulatory Visit (INDEPENDENT_AMBULATORY_CARE_PROVIDER_SITE_OTHER): Payer: Self-pay | Admitting: Family Medicine

## 2020-09-26 ENCOUNTER — Other Ambulatory Visit: Payer: Self-pay

## 2020-09-26 ENCOUNTER — Encounter: Payer: Self-pay | Admitting: Family Medicine

## 2020-09-26 VITALS — BP 144/91 | HR 49 | Temp 97.2°F | Ht 66.0 in | Wt 194.1 lb

## 2020-09-26 DIAGNOSIS — E119 Type 2 diabetes mellitus without complications: Secondary | ICD-10-CM

## 2020-09-26 DIAGNOSIS — E7849 Other hyperlipidemia: Secondary | ICD-10-CM

## 2020-09-26 DIAGNOSIS — Z789 Other specified health status: Secondary | ICD-10-CM

## 2020-09-26 DIAGNOSIS — J301 Allergic rhinitis due to pollen: Secondary | ICD-10-CM

## 2020-09-26 DIAGNOSIS — E785 Hyperlipidemia, unspecified: Secondary | ICD-10-CM

## 2020-09-26 LAB — POCT GLYCOSYLATED HEMOGLOBIN (HGB A1C)
HbA1c POC (<> result, manual entry): 6.9 % (ref 4.0–5.6)
HbA1c, POC (controlled diabetic range): 6.9 % (ref 0.0–7.0)
HbA1c, POC (prediabetic range): 6.9 % — AB (ref 5.7–6.4)
Hemoglobin A1C: 6.9 % — AB (ref 4.0–5.6)

## 2020-09-26 LAB — GLUCOSE, POCT (MANUAL RESULT ENTRY): POC Glucose: 144 mg/dl — AB (ref 70–99)

## 2020-09-26 MED ORDER — CETIRIZINE HCL 10 MG PO TABS
10.0000 mg | ORAL_TABLET | Freq: Every day | ORAL | 11 refills | Status: DC
Start: 2020-09-26 — End: 2021-08-27
  Filled 2020-09-26: qty 30, 30d supply, fill #0

## 2020-09-26 MED ORDER — ATORVASTATIN CALCIUM 40 MG PO TABS
40.0000 mg | ORAL_TABLET | Freq: Every day | ORAL | 3 refills | Status: DC
  Filled 2020-09-26: qty 30, 30d supply, fill #0
  Filled 2020-10-27: qty 30, 30d supply, fill #1
  Filled 2020-11-27: qty 30, 30d supply, fill #2
  Filled 2020-12-28: qty 30, 30d supply, fill #3
  Filled 2021-01-29: qty 30, 30d supply, fill #4
  Filled 2021-02-28: qty 30, 30d supply, fill #5
  Filled 2021-04-04: qty 30, 30d supply, fill #6
  Filled 2021-05-04: qty 30, 30d supply, fill #7
  Filled 2021-06-04: qty 30, 30d supply, fill #0
  Filled 2021-07-05: qty 30, 30d supply, fill #1
  Filled 2021-08-02: qty 30, 30d supply, fill #2
  Filled 2021-09-04: qty 30, 30d supply, fill #3

## 2020-09-26 MED ORDER — FLUTICASONE PROPIONATE 50 MCG/ACT NA SUSP
2.0000 | Freq: Every day | NASAL | 6 refills | Status: DC
  Filled 2020-09-26: qty 16, 25d supply, fill #0
  Filled 2020-10-27: qty 16, 25d supply, fill #1
  Filled 2021-08-02: qty 16, 25d supply, fill #0

## 2020-09-26 MED FILL — Metformin HCl Tab 500 MG: ORAL | 30 days supply | Qty: 30 | Fill #0 | Status: AC

## 2020-09-26 NOTE — Progress Notes (Signed)
Patient Drew [name]    Established Patient Office Visit  Subjective:  Patient ID: Drew [name], male    DOB: January 25, 1976  Age: 45 y.o. MRN: [id number]  CC:  Chief Complaint  Patient presents with  . Follow-up    6 month follow up, needing medications refilled.     HPI  Drew [name] is a 45 year old male with a medical history significant for type 2 diabetes mellitus, hyperlipidemia, and seasonal allergies that presents for follow-up of chronic conditions.  Patient says that he has been doing well, however he has not been following a low carbohydrate, low-fat diet.  He works a very physical job, so he does not exercise consistently.  Diabetes He presents for his follow-up diabetic visit. He has type 2 diabetes mellitus. His disease course has been stable. Pertinent negatives for diabetes include no blurred vision, no chest pain, no fatigue, no foot paresthesias, no foot ulcerations, no polydipsia, no polyphagia, no polyuria, no visual change, no weakness and no weight loss. There are no hypoglycemic complications. Symptoms are stable. There are no diabetic complications. Risk factors for coronary artery disease include obesity and sedentary lifestyle. He is compliant with treatment most of the time.   Past Medical History:  Diagnosis Date  . Facial nerve palsy   . Stroke ([place])   . Type 2 diabetes mellitus ([place])     Past Surgical History:  Procedure Laterality Date  . APPENDECTOMY      Family History  Problem Relation Age of Onset  . Stroke Father     Social History   Socioeconomic History  . Marital status: Married    Spouse name: Not on file  . Number of children: Not on file  . Years of education: Not on file  . Highest education level: Not on file  Occupational History  . Not on file  Tobacco Use  . Smoking status: Former Smoker    Types: Cigarettes  . Smokeless tobacco: Never Used  Vaping Use  . Vaping Use: Never used   Substance and Sexual Activity  . Alcohol use: Yes    Comment: weekly  . Drug use: No  . Sexual activity: Not on file  Other Topics Concern  . Not on file  Social History Narrative  . Not on file   Social Determinants of Health   Financial Resource Strain: Not on file  Food Insecurity: Not on file  Transportation Needs: Not on file  Physical Activity: Not on file  Stress: Not on file  Social Connections: Not on file  Intimate Partner Violence: Not on file    Outpatient Medications Prior to Visit  Medication Sig Dispense Refill  . aspirin 81 MG EC tablet Take 1 tablet (81 mg total) by mouth daily. 90 tablet 3  . atorvastatin (LIPITOR) 40 MG tablet TAKE 2 TABLETS (80 MG TOTAL) BY MOUTH DAILY AT 6 PM. 60 tablet 3  . cetirizine (ZYRTEC) 10 MG tablet Take 1 tablet (10 mg total) by mouth daily. 30 tablet 11  . fluticasone (FLONASE) 50 MCG/ACT nasal spray Place 2 sprays into both nostrils daily. 16 g 6  . metFORMIN (GLUCOPHAGE) 500 MG tablet TAKE 1 TABLET (500 MG TOTAL) BY MOUTH DAILY WITH BREAKFAST. 90 tablet 2  . meloxicam (MOBIC) 7.5 MG tablet Take 1 tablet (7.5 mg total) by mouth daily. 30 tablet 3   No facility-administered medications prior to visit.    No Known Allergies  ROS Review of Systems  Constitutional:  Negative for fatigue, unexpected weight change and weight loss.  HENT: Negative.   Eyes: Negative for blurred vision.  Respiratory: Negative.   Cardiovascular: Negative for chest pain.  Gastrointestinal: Negative.   Endocrine: Negative for polydipsia, polyphagia and polyuria.  Musculoskeletal: Negative.   Neurological: Negative.  Negative for weakness.  Psychiatric/Behavioral: Negative.       Objective:    Physical Exam Constitutional:      Appearance: Normal appearance.  Eyes:     Pupils: Pupils are equal, round, and reactive to light.  Cardiovascular:     Rate and Rhythm: Normal rate and regular rhythm.     Pulses: Normal pulses.  Pulmonary:      Effort: Pulmonary effort is normal.  Abdominal:     General: Bowel sounds are normal.  Musculoskeletal:        General: Normal range of motion.  Skin:    General: Skin is warm.  Neurological:     General: No focal deficit present.     Mental Status: He is alert. Mental status is at baseline.  Psychiatric:        Mood and Affect: Mood normal.        Thought Content: Thought content normal.        Judgment: Judgment normal.     BP (!) 144/91 (BP Location: Left Arm, Patient Position: Sitting, Cuff Size: Large)   Pulse (!) 49   Temp (!) 97.2 F (36.2 C)   Ht 5\' 6"  (1.676 m)   Wt 194 lb 0.8 oz (88 kg)   SpO2 100%   BMI 31.32 kg/m  Wt Readings from Last 3 Encounters:  09/26/20 194 lb 0.8 oz (88 kg)  08/22/20 200 lb (90.7 kg)  03/28/20 190 lb (86.2 kg)     Health Maintenance Due  Topic Date Due  . OPHTHALMOLOGY EXAM  12/14/2019  . COVID-19 Vaccine (3 - Booster for Pfizer series) 01/19/2020  . URINE MICROALBUMIN  09/27/2020    There are no preventive care reminders to display for this patient.  No results found for: TSH Lab Results  Component Value Date   WBC 6.3 06/29/2019   HGB 15.0 06/29/2019   HCT 42.7 06/29/2019   MCV 92 06/29/2019   PLT 209 06/29/2019   Lab Results  Component Value Date   NA 140 03/28/2020   K 4.4 03/28/2020   CO2 22 03/28/2020   GLUCOSE 122 (H) 03/28/2020   BUN 12 03/28/2020   CREATININE 0.76 03/28/2020   BILITOT 0.5 03/28/2020   ALKPHOS 93 03/28/2020   AST 25 03/28/2020   ALT 29 03/28/2020   PROT 6.7 03/28/2020   ALBUMIN 4.6 03/28/2020   CALCIUM 9.4 03/28/2020   ANIONGAP 11 12/17/2017   Lab Results  Component Value Date   CHOL 111 06/29/2019   Lab Results  Component Value Date   HDL 40 06/29/2019   Lab Results  Component Value Date   LDLCALC 56 06/29/2019   Lab Results  Component Value Date   TRIG 75 06/29/2019   Lab Results  Component Value Date   CHOLHDL 2.8 06/29/2019   Lab Results  Component Value Date    HGBA1C 6.9 (A) 09/26/2020   HGBA1C 6.9 09/26/2020   HGBA1C 6.9 (A) 09/26/2020   HGBA1C 6.9 09/26/2020      Assessment & Plan:   Problem List Items Addressed This Visit   None   Visit Diagnoses    Type 2 diabetes mellitus without complication, without long-term current use of insulin ([place])    -  Primary   Relevant Orders   Glucose (CBG) (Completed)   HgB A1c (Completed)     Type 2 diabetes mellitus without complication, without long-term current use of insulin ([facility]) - Glucose (CBG) - HgB A1c - Microalbumin/Creatinine Ratio, Urine - Basic Metabolic Panel   Hyperlipidemia LDL goal <100  - atorvastatin (LIPITOR) 40 MG tablet; Take 1 tablet (40 mg total) by mouth daily.  Dispense: 90 tablet; Refill: 3 - Lipid Panel  Seasonal allergic rhinitis due to pollen - fluticasone (FLONASE) 50 MCG/ACT nasal spray; Place 2 sprays into both nostrils daily.  Dispense: 16 g; Refill: 6 - cetirizine (ZYRTEC) 10 MG tablet; Take 1 tablet (10 mg total) by mouth daily.  Dispense: 30 tablet; Refill: 11  Language barrier to communication Interpreter utilized to assist with communication  Follow-up: Return in about 6 months (around 03/29/2021) for diabetes.    Cammie Sickle, FNP

## 2020-09-26 NOTE — Patient Instructions (Signed)
Diabetes mellitus y nutricin, en adultos Diabetes Mellitus and Nutrition, Adult Si sufre de diabetes, o diabetes mellitus, es muy importante tener hbitos alimenticios saludables debido a que sus niveles de Designer, television/film set sangre (glucosa) se ven afectados en gran medida por lo que come y bebe. [place] alimentos saludables en las cantidades correctas, aproximadamente a la misma hora todos los [place], [place] ayudar a:  Aeronautical engineer glucemia.  Disminuir el riesgo de sufrir una enfermedad cardaca.  Mejorar la presin arterial.  Science writer o mantener un peso saludable. Qu puede afectar mi plan de alimentacin? Todas las personas que sufren de diabetes son diferentes y cada una tiene necesidades diferentes en cuanto a un plan de alimentacin. El mdico puede recomendarle que trabaje con un nutricionista para elaborar el mejor plan para usted. Su plan de alimentacin puede variar segn factores como:  Las caloras que necesita.  Los medicamentos que toma.  Su peso.  Sus niveles de glucemia, presin arterial y colesterol.  Su nivel de [place].  Otras afecciones que tenga, como enfermedades cardacas o renales. Cmo me afectan los carbohidratos? Los carbohidratos, o hidratos de carbono, afectan su nivel de glucemia ms que cualquier otro tipo de alimento. La ingesta de carbohidratos naturalmente aumenta la cantidad de [employer]. El recuento de carbohidratos es un mtodo destinado a Catering manager un registro de la cantidad de carbohidratos que se consumen. El recuento de carbohidratos es importante para Theatre manager la glucemia a un nivel saludable, especialmente si utiliza insulina o toma determinados medicamentos por va oral para la diabetes. Es importante conocer la cantidad de carbohidratos que se pueden ingerir en cada comida sin correr Engineer, manufacturing. Esto es Psychologist, forensic. Su nutricionista puede ayudarlo a calcular la cantidad de carbohidratos que debe ingerir en cada comida y en cada  refrigerio. Cmo me afecta el alcohol? El alcohol puede provocar disminuciones sbitas de la glucemia (hipoglucemia), especialmente si utiliza insulina o toma determinados medicamentos por va oral para la diabetes. La hipoglucemia es una afeccin potencialmente mortal. Los sntomas de la hipoglucemia, como somnolencia, mareos y confusin, son similares a los sntomas de haber consumido demasiado alcohol.  No beba alcohol si: ? Su mdico le indica no hacerlo. ? Est embarazada, puede estar embarazada o est tratando de quedar embarazada.  Si bebe alcohol: ? No beba con el estmago vaco. ? Limite la cantidad que bebe:  De 0 a 1 medida por da para las mujeres.  De 0 a 2 medidas por da para los hombres. ? Est atento a la cantidad de alcohol que hay en las bebidas que toma. En los [place], una medida equivale a una botella de cerveza de 12oz (368m), un vaso de vino de 5oz (1468m o un vaso de una bebida alcohlica de alta graduacin de 1oz (4485m ? Mantngase hidratado bebiendo agua, refrescos dietticos o t helado sin azcar.  Tenga en cuenta que los refrescos comunes, los jugos y otras bebida para mezOptician, dispensingeden contener mucha azcar y se deben contar como carbohidratos. Consejos para seguir estPhotographers etiquetas de los alimentos  Comience por leer el tamao de la porcin en la "Informacin nutricional" en las etiquetas de los alimentos envasados y las bebidas. La cantidad de caloras, carbohidratos, grasas y otros nutrientes mencionados en la etiqueta se basan en una porcin del alimento. Muchos alimentos contienen ms de una porcin por envase.  Verifique la cantidad total de gramos (g) de carbohidratos totales en una porcin. Puede calcular la cantidad de porciones  de carbohidratos al dividir el total de carbohidratos por 15. Por ejemplo, si un alimento tiene un total de 30g de carbohidratos totales por porcin, equivale a 2 porciones de  carbohidratos.  Verifique la cantidad de gramos (g) de grasas saturadas y grasas trans de una porcin. Escoja alimentos que no contengan estas grasas o que su contenido de estas sea [place].  Verifique la cantidad de miligramos (mg) de sal (sodio) en una porcin. [place] [employer] de las personas deben limitar la ingesta de sodio total a menos de 2333m por dTraining and development officer  Siempre consulte la informacin nutricional de los alimentos etiquetados como "con bajo contenido de grasa" o "sin grasa". Estos alimentos pueden tener un mayor contenido de aLocation manageragregada o carbohidratos refinados, y deben evitarse.  Hable con su nutricionista para identificar sus objetivos diarios en cuanto a los nutrientes mencionados en la etiqueta. Al ir de compras  Evite comprar alimentos procesados, enlatados o precocidos. Estos alimentos tienden a tSpecial educational needs teachermayor cantidad de gF[place] sodio y azcar agregada.  Compre en la zona exterior de la tienda de comestibles. Esta es la zona donde se encuentran con mayor frecuencia las frutas y las verduras frescas, los cereales a granel, las carnes frescas y los productos lcteos frescos. Al cocinar  Utilice mtodos de coccin a baja temperatura, como hornear, en lugar de mtodos de coccin a alta temperatura, como frer en abundante aceite.  Cocine con aceites saludables, como el aceite de oFrontier canola o gPiketon  Evite cocinar con manteca, crema o carnes con alto contenido de grasa. Planificacin de las comidas  Coma las comidas y los refrigerios regularmente, preferentemente a la misma hora todos lK[place] Evite pasar largos perodos de tiempo sin comer.  Consuma alimentos ricos en fibra, como frutas frescas, verduras, frijoles y cereales integrales. Consulte a su nutricionista sobre cuntas porciones de carbohidratos puede consumir en cada comida.  Consuma entre 4 y 6 onzas (entre 112 y 168g) de protenas magras por da, como carnes magras, pollo, pescado, huevos o tofu. Una onza (oz) de  protena magra equivale a: ? 1 onza (28g) de carne, pollo o pescado. ? 1huevo. ?  de taza (62 g) de tofu.  Coma algunos alimentos por da que contengan grasas saludables, como aguacates, frutos secos, semillas y pescado.   Qu alimentos debo comer? FLambert ModyBayas. Manzanas. Naranjas. Duraznos. Damascos. Ciruelas. Uvas. Mango. Papaya. GTolna Kiwi. Cerezas. VHolland CommonsLValeda Malm Espinaca. Verduras de hBoeing que incluyen col rizada, aFolsom hojas de bIraqy de mFountain Remolachas. Coliflor. Repollo. Brcoli. Zanahorias. Judas verdes. Tomates. Pimientos. Cebollas. Pepinos. Coles de Bruselas. Granos Granos integrales, como panes, galletas, tortillas, cereales y pastas de salvado o integrales. Avena sin azcar. Quinua. Arroz integral o salvaje. Carnes y oPsychiatric nurse Carne de ave sin piel. Cortes magros de ave y carne de res. Tofu. Frutos secos. Semillas. Lcteos Productos lcteos sin grasa o con bajo contenido de gPepin cUpper Stewartsville yogur y qKahaluu-Keauhou Es posible que los productos que se enumeran ms aNew Caledoniano constituyan una lista completa de los alimentos y las bebidas que puede tomar. Consulte a un nutricionista para obtener ms informacin. Qu alimentos debo evitar? FLambert ModyFrutas enlatadas al almbar. Verduras Verduras enlatadas. Verduras congeladas con mantequilla o salsa de crema. Granos Productos elaborados con hIsraely hLao People's Democratic Republic como panes, pastas, bocadillos y cereales. Evite todos los alimentos procesados. Carnes y otras protenas Cortes de carne con alto contenido de gLobbyist Carne de ave con piel. Carnes empanizadas o fritas. Carne procesada. Evite las grasas saturadas.  los alimentos y las bebidas que debe evitar. Consulte a un nutricionista para obtener ms informacin. Preguntas para hacerle al mdico Es necesario que me rena con un instructor en el cuidado  de la diabetes? Es necesario que me rena con un nutricionista? A qu nmero puedo llamar si tengo preguntas? Cules son los mejores momentos para controlar la glucemia? Dnde encontrar ms informacin: Asociacin Estadounidense de la Diabetes (American Diabetes Association): diabetes.org Academy of Nutrition and Dietetics (Academia de Nutricin y Diettica): www.eatright.org National Institute of Diabetes and Digestive and Kidney Diseases (Instituto Nacional de la Diabetes y las Enfermedades Digestivas y Renales): www.niddk.nih.gov Association of Diabetes Care and Education Specialists (Asociacin de Especialistas en Atencin y Educacin sobre la Diabetes): www.diabeteseducator.org Resumen Es importante tener hbitos alimenticios saludables debido a que sus niveles de azcar en la sangre (glucosa) se ven afectados en gran medida por lo que come y bebe. Un plan de alimentacin saludable lo ayudar a controlar la glucemia y mantener un estilo de vida saludable. El mdico puede recomendarle que trabaje con un nutricionista para elaborar el mejor plan para usted. Tenga en cuenta que los carbohidratos (hidratos de carbono) y el alcohol tienen efectos inmediatos en sus niveles de glucemia. Es importante contar los carbohidratos que ingiere y consumir alcohol con prudencia. Esta informacin no tiene como fin reemplazar el consejo del mdico. Asegrese de hacerle al mdico cualquier pregunta que tenga. Document Revised: 06/03/2019 Document Reviewed: 06/03/2019 Elsevier Patient Education  2021 Elsevier Inc.  

## 2020-09-27 LAB — BASIC METABOLIC PANEL
BUN/Creatinine Ratio: 18 (ref 9–20)
BUN: 13 mg/dL (ref 6–24)
CO2: 21 mmol/L (ref 20–29)
Calcium: 9.2 mg/dL (ref 8.7–10.2)
Chloride: 106 mmol/L (ref 96–106)
Creatinine, Ser: 0.73 mg/dL — ABNORMAL LOW (ref 0.76–1.27)
Glucose: 142 mg/dL — ABNORMAL HIGH (ref 65–99)
Potassium: 4.6 mmol/L (ref 3.5–5.2)
Sodium: 141 mmol/L (ref 134–144)
eGFR: 115 mL/min/{1.73_m2} (ref >59)

## 2020-09-27 LAB — LIPID PANEL
Chol/HDL Ratio: 2.9 ratio (ref 0.0–5.0)
Cholesterol, Total: 150 mg/dL (ref 100–199)
HDL: 51 mg/dL (ref >39)
LDL Chol Calc (NIH): 83 mg/dL (ref 0–99)
Triglycerides: 85 mg/dL (ref 0–149)
VLDL Cholesterol Cal: 16 mg/dL (ref 5–40)

## 2020-09-27 LAB — MICROALBUMIN / CREATININE URINE RATIO
Creatinine, Urine: 81.7 mg/dL
Microalb/Creat Ratio: <4 mg/g creat (ref 0–29)
Microalbumin, Urine: <3 ug/mL

## 2020-10-27 ENCOUNTER — Other Ambulatory Visit: Payer: Self-pay

## 2020-10-27 MED FILL — Metformin HCl Tab 500 MG: ORAL | 30 days supply | Qty: 30 | Fill #1 | Status: AC

## 2020-11-27 ENCOUNTER — Other Ambulatory Visit: Payer: Self-pay

## 2020-11-27 MED FILL — Metformin HCl Tab 500 MG: ORAL | 30 days supply | Qty: 30 | Fill #2 | Status: AC

## 2020-12-28 ENCOUNTER — Other Ambulatory Visit: Payer: Self-pay

## 2020-12-28 MED FILL — Metformin HCl Tab 500 MG: ORAL | 30 days supply | Qty: 30 | Fill #3 | Status: AC

## 2021-01-29 ENCOUNTER — Other Ambulatory Visit: Payer: Self-pay

## 2021-01-29 MED FILL — Metformin HCl Tab 500 MG: ORAL | 30 days supply | Qty: 30 | Fill #4 | Status: AC

## 2021-02-28 ENCOUNTER — Other Ambulatory Visit: Payer: Self-pay

## 2021-02-28 MED FILL — Metformin HCl Tab 500 MG: ORAL | 30 days supply | Qty: 30 | Fill #5 | Status: AC

## 2021-04-03 ENCOUNTER — Other Ambulatory Visit (HOSPITAL_COMMUNITY): Payer: Self-pay

## 2021-04-03 ENCOUNTER — Other Ambulatory Visit: Payer: Self-pay

## 2021-04-03 ENCOUNTER — Ambulatory Visit (INDEPENDENT_AMBULATORY_CARE_PROVIDER_SITE_OTHER): Payer: Self-pay | Admitting: Family Medicine

## 2021-04-03 ENCOUNTER — Encounter: Payer: Self-pay | Admitting: Family Medicine

## 2021-04-03 VITALS — BP 132/80 | HR 54 | Temp 98.0°F | Ht 66.0 in | Wt 189.4 lb

## 2021-04-03 DIAGNOSIS — E785 Hyperlipidemia, unspecified: Secondary | ICD-10-CM

## 2021-04-03 DIAGNOSIS — D1722 Benign lipomatous neoplasm of skin and subcutaneous tissue of left arm: Secondary | ICD-10-CM

## 2021-04-03 DIAGNOSIS — E119 Type 2 diabetes mellitus without complications: Secondary | ICD-10-CM

## 2021-04-03 DIAGNOSIS — G4700 Insomnia, unspecified: Secondary | ICD-10-CM

## 2021-04-03 DIAGNOSIS — Z789 Other specified health status: Secondary | ICD-10-CM

## 2021-04-03 DIAGNOSIS — Z23 Encounter for immunization: Secondary | ICD-10-CM

## 2021-04-03 LAB — POCT URINALYSIS DIP (CLINITEK)
Bilirubin, UA: NEGATIVE
Blood, UA: NEGATIVE
Glucose, UA: NEGATIVE mg/dL
Ketones, POC UA: NEGATIVE mg/dL
Leukocytes, UA: NEGATIVE
Nitrite, UA: NEGATIVE
POC PROTEIN,UA: NEGATIVE
Spec Grav, UA: >=1.03 — AB (ref 1.010–1.025)
Urobilinogen, UA: 0.2 E.U./dL
pH, UA: 5.5 (ref 5.0–8.0)

## 2021-04-03 LAB — POCT GLYCOSYLATED HEMOGLOBIN (HGB A1C)
HbA1c POC (<> result, manual entry): 6.7 % (ref 4.0–5.6)
HbA1c, POC (controlled diabetic range): 6.7 % (ref 0.0–7.0)
HbA1c, POC (prediabetic range): 6.7 % — AB (ref 5.7–6.4)
Hemoglobin A1C: 6.7 % — AB (ref 4.0–5.6)

## 2021-04-03 MED ORDER — ZOLPIDEM TARTRATE 5 MG PO TABS
5.0000 mg | ORAL_TABLET | Freq: Every evening | ORAL | 1 refills | Status: DC | PRN
  Filled 2021-04-03: qty 15, 15d supply, fill #0

## 2021-04-03 NOTE — Progress Notes (Signed)
Patient [facility]   Established Patient Office Visit  Subjective:  Patient ID: Drew [name], male    DOB: Nov 17, 1975  Age: 45 y.o. MRN: [id number]  CC:  Chief Complaint  Patient presents with   Follow-up    Pt had a headache and dizziness for 2 months and has a lump on the left arm on the side, had the lump for a year and half. Pt states he hasn't taken anything for the pain pt thinks he has insomnia     HPI Drew [name] presents for a follow-up of chronic conditions.  Patient primarily speaks Spanish, utilized a video interpreter to assist with communication.  He is also accompanied by his wife on today.  He states that he has been out of his medications over the past several weeks.  He is complaining of periodic dizziness and inability to fall asleep and stay asleep.  He typically sleeps 4-5 hours per night.  Patient states that he typically utilizes a cell phone and watches television in bed.  He has attempted melatonin a few times without any relief.  He does not have a history of sleep apnea.  He has not attempted any over-the-counter sleep aid with the exception of melatonin.  Patient is reporting a lump to left upper extremity that has been present over the past several months.  He states that it has not changed in size or color.  Lump is painless.  He does not have a family history of skin cancer or other carcinomas. Patient also has chronic conditions that include type 2 diabetes and hyperlipidemia: Diabetes He presents for his follow-up diabetic visit. He has type 2 diabetes mellitus. His disease course has been stable. Pertinent negatives for diabetes include no blurred vision, no chest pain, no fatigue, no foot paresthesias, no polydipsia, no polyphagia, no polyuria, no weakness and no weight loss. Current diabetic treatment includes oral agent (monotherapy). He is compliant with treatment all of the time.  Hyperlipidemia This is a chronic problem.  The problem is controlled. Recent lipid tests were reviewed and are normal. Exacerbating diseases include diabetes. Pertinent negatives include no chest pain. Current antihyperlipidemic treatment includes exercise and statins. Risk factors for coronary artery disease include dyslipidemia.   Past Medical History:  Diagnosis Date   Facial nerve palsy    Stroke ([place])    Type 2 diabetes mellitus (HCC)     Past Surgical History:  Procedure Laterality Date   APPENDECTOMY      Family History  Problem Relation Age of Onset   Stroke Father     Social History   Socioeconomic History   Marital status: Married    Spouse name: Not on file   Number of children: Not on file   Years of education: Not on file   Highest education level: Not on file  Occupational History   Not on file  Tobacco Use   Smoking status: Some Days    Types: Cigarettes   Smokeless tobacco: Never   Tobacco comments:    Pt states he smokes once or twice a month  Vaping Use   Vaping Use: Never used  Substance and Sexual Activity   Alcohol use: Yes    Comment: weekly   Drug use: No   Sexual activity: Not on file  Other Topics Concern   Not on file  Social History Narrative   Not on file   Social Determinants of Health   Financial Resource Strain: Not  on file  Food Insecurity: Not on file  Transportation Needs: Not on file  Physical Activity: Not on file  Stress: Not on file  Social Connections: Not on file  Intimate Partner Violence: Not on file    Outpatient Medications Prior to Visit  Medication Sig Dispense Refill   aspirin 81 MG EC tablet Take 1 tablet (81 mg total) by mouth daily. 90 tablet 3   atorvastatin (LIPITOR) 40 MG tablet Take 1 tablet (40 mg total) by mouth daily. 90 tablet 3   cetirizine (ZYRTEC) 10 MG tablet Take 1 tablet (10 mg total) by mouth daily. (Patient not taking: Reported on 04/03/2021) 30 tablet 11   fluticasone (FLONASE) 50 MCG/ACT nasal spray Place 2 sprays into both  nostrils daily. (Patient not taking: Reported on 04/03/2021) 16 g 6   metFORMIN (GLUCOPHAGE) 500 MG tablet TAKE 1 TABLET (500 MG TOTAL) BY MOUTH DAILY WITH BREAKFAST. 90 tablet 2   No facility-administered medications prior to visit.    No Known Allergies  ROS Review of Systems  Constitutional: Negative.  Negative for fatigue and weight loss.  HENT: Negative.    Eyes: Negative.  Negative for blurred vision.  Cardiovascular: Negative.  Negative for chest pain.  Gastrointestinal: Negative.   Endocrine: Negative for polydipsia, polyphagia and polyuria.  Musculoskeletal: Negative.   Skin: Negative.   Neurological:  Negative for weakness.     Objective:    Physical Exam Constitutional:      Appearance: He is obese.  Eyes:     Pupils: Pupils are equal, round, and reactive to light.  Cardiovascular:     Rate and Rhythm: Normal rate and regular rhythm.  Pulmonary:     Effort: Pulmonary effort is normal.  Abdominal:     General: Bowel sounds are normal.  Musculoskeletal:        General: Normal range of motion.  Skin:    General: Skin is warm.  Neurological:     General: No focal deficit present.     Mental Status: Mental status is at baseline.  Psychiatric:        Mood and Affect: Mood normal.        Thought Content: Thought content normal.        Judgment: Judgment normal.    BP 132/80   Pulse (!) 54   Temp 98 F (36.7 C)   Wt 189 lb 6 oz (85.9 kg)   SpO2 100%   BMI 30.57 kg/m  Wt Readings from Last 3 Encounters:  04/03/21 189 lb 6 oz (85.9 kg)  09/26/20 194 lb 0.8 oz (88 kg)  08/22/20 200 lb (90.7 kg)     Health Maintenance Due  Topic Date Due   Hepatitis C Screening  Never done   COVID-19 Vaccine (3 - Booster for Pfizer series) 10/14/2019   OPHTHALMOLOGY EXAM  12/14/2019    There are no preventive care reminders to display for this patient.  No results found for: TSH Lab Results  Component Value Date   WBC 6.3 06/29/2019   HGB 15.0 06/29/2019    HCT 42.7 06/29/2019   MCV 92 06/29/2019   PLT 209 06/29/2019   Lab Results  Component Value Date   NA 141 04/03/2021   K 4.9 04/03/2021   CO2 23 04/03/2021   GLUCOSE 143 (H) 04/03/2021   BUN 11 04/03/2021   CREATININE 0.76 04/03/2021   BILITOT 0.4 04/03/2021   ALKPHOS 106 04/03/2021   AST 22 04/03/2021   ALT 23 04/03/2021  PROT 6.8 04/03/2021   ALBUMIN 4.6 04/03/2021   CALCIUM 9.1 04/03/2021   ANIONGAP 11 12/17/2017   EGFR 114 04/03/2021   Lab Results  Component Value Date   CHOL 150 09/26/2020   Lab Results  Component Value Date   HDL 51 09/26/2020   Lab Results  Component Value Date   LDLCALC 83 09/26/2020   Lab Results  Component Value Date   TRIG 85 09/26/2020   Lab Results  Component Value Date   CHOLHDL 2.9 09/26/2020   Lab Results  Component Value Date   HGBA1C 6.7 (A) 04/03/2021   HGBA1C 6.7 04/03/2021   HGBA1C 6.7 (A) 04/03/2021   HGBA1C 6.7 04/03/2021      Assessment & Plan:   Problem List Items Addressed This Visit   None Visit Diagnoses     Type 2 diabetes mellitus without complication, without long-term current use of insulin (HCC)    -  Primary   Relevant Orders   HgB A1c (Completed)   POCT URINALYSIS DIP (CLINITEK) (Completed)   CMP and Liver (Completed)   Needs flu shot       Relevant Orders   Flu Vaccine QUAD 36+ mos IM (Fluarix, Fluzone & Afluria Quad PF (Completed)   Language barrier to communication       Hyperlipidemia LDL goal <100       Relevant Orders   CMP and Liver (Completed)   Insomnia, unspecified type       Relevant Medications   zolpidem (AMBIEN) 5 MG tablet   Lipoma of left upper extremity         1. Type 2 diabetes mellitus without complication, without long-term current use of insulin (HCC) Hemoglobin A1c is 6.7, which is improved from previous.  Also, A1c is at goal.  Patient advised to follow a low-fat, low carbohydrate diet divided over small meals. - HgB A1c - POCT URINALYSIS DIP (CLINITEK) - CMP and  Liver  2. Needs flu shot  - Flu Vaccine QUAD 36+ mos IM (Fluarix, Fluzone & Afluria Quad PF  3. Language barrier to communication Utilize video interpreter to assist with communication  4. Hyperlipidemia LDL goal <100 The 10-year ASCVD risk score (Arnett DK, et al., 2019) is: 6%   Values used to calculate the score:     Age: 89 years     Sex: Male     Is Non-Hispanic African American: No     Diabetic: Yes     Tobacco smoker: Yes     Systolic Blood Pressure: 818 mmHg     Is BP treated: No     HDL Cholesterol: 51 mg/dL     Total Cholesterol: 150 mg/dL  - CMP and Liver  5. Insomnia, unspecified type Will trial zolpidem 5 mg at bedtime as needed for sleep - zolpidem (AMBIEN) 5 MG tablet; Take 1 tablet (5 mg total) by mouth at bedtime as needed for sleep.  Dispense: 15 tablet; Refill: 1  6. Lipoma of left upper extremity Round, movable lipoma to left upper extremity, posterior aspect.  We will continue surveillance every 6 months.  No imaging warranted at this time.  Also, no further intervention warranted right now. Follow-up: Return in about 6 months (around 10/01/2021) for diabetes.    [name]  APRN, MSN, FNP-C Patient [facility] [address] [place], [place] [address] [phone]

## 2021-04-03 NOTE — Patient Instructions (Addendum)
Lipoma Lipoma Un lipoma es un tumor no canceroso (benigno) formado por clulas de grasa. Es un tipo muy frecuente de [employer] tejidos blandos. Por lo general, los lipomas se encuentran debajo de la piel (subcutneos). Pueden aparecer en cualquier tejido del cuerpo que contenga grasa. Las zonas en las que los lipomas aparecen con mayor frecuencia incluyen la espalda, los [place], los hombros, las nalgas y los muslos. Los lipomas crecen lentamente y, en general, son indoloros. La mayora de los lipomas no causan problemas y no requieren Clinical research associate. Cules son las causas? Se desconoce la causa de esta afeccin. Qu incrementa el riesgo? Es ms probable que sufra esta afeccin si: Tiene entre 49 y 22 aos de edad. Tiene antecedentes familiares de lipomas. Cules son los signos o sntomas? Por lo general, el lipoma aparece como una pequea protuberancia redonda debajo de la piel. En la [employer], el bulto tiene las siguientes caractersticas: Se siente suave o elstico. No causa dolor ni otros sntomas. Sin embargo, si el lipoma se encuentra en un rea en la que hace presin [employer] nervios, puede causar dolor u otros sntomas. Cmo se diagnostica? Por lo general, el lipoma puede diagnosticarse con un examen fsico. Tambin pueden hacerle estudios para confirmar el diagnstico y Paramedic otras afecciones. Las pruebas pueden incluir las siguientes: Pruebas de diagnstico por imgenes, como una exploracin por tomografa computarizada (TC) o una resonancia magntica (RM). Extraccin de [place] de tejido para analizar con un microscopio (biopsia). Cmo se trata? El tratamiento de esta afeccin depende del tamao del lipoma y si causa algn sntoma. Los lipomas pequeos que no causan problemas no requieren tratamiento. Si un lipoma se agranda o causa problemas, puede realizarse [place] para extirpar el lipoma. Los lipomas tambin pueden extirparse para mejorar el  aspecto. En la [employer], [place] procedimiento se realiza despus de aplicar un medicamento que adormece el rea (anestesia local). Pueden realizarse una liposuccin para reducir el tamao del lipoma antes de que se lo extraigan quirrgicamente, o bien se la puede realizar para extirpar el lipoma. Los lipomas se extirpan con este mtodo para limitar el tamao de la incisin y las cicatrices. Se introduce un tubo de liposuccin a travs de una pequea incisin en el lipoma y el contenido del lipoma se extrae a travs del tubo mediante succin. Siga estas indicaciones en casa: Controle el lipoma para detectar cambios. Concurra a todas las visitas de seguimiento como se lo haya indicado el mdico. Esto es importante. Comunquese con un mdico si: El lipoma se agranda o se endurece. El lipoma comienza a causarle dolor, se enrojece o se hincha cada vez ms. Estos podran ser signos de infeccin o de una afeccin ms grave. Solicite ayuda de inmediato si: Siente hormigueo o adormecimiento en el rea cerca del lipoma. Esto podra indicar que el lipoma es lo que causa dao nervioso. Resumen Un lipoma es un tumor no canceroso formado por clulas de grasa. La mayora de los lipomas no causan problemas y no requieren Clinical research associate. Si un lipoma se agranda o causa problemas, puede realizarse [place] para extirpar el lipoma. Comunquese con un mdico si el lipoma se agranda o se endurece, o si empieza a dolor, se pone de color rojo o se hincha cada vez ms. El dolor, el enrojecimiento y la hinchazn podran ser signos de infeccin o de una afeccin ms grave. Esta informacin no tiene Marine scientist el consejo del mdico. Asegrese de hacerle al mdico  cualquier pregunta que tenga. Document Revised: 02/23/2019 Document Reviewed: 02/23/2019 Elsevier Patient Education  [place]. Zolpidem Tablets Sander Nephew es [place] medicamento? El ZOLPIDEM se [place] para tratar el insomnio. Le ayuda a dormirse ms  rpido y Public affairs consultant dormido durante la noche. Con frecuencia se [place] por periodos breves. Este medicamento puede ser utilizado para otros usos; si tiene alguna pregunta consulte con su proveedor de atencin mdica o con su farmacutico. MARCAS COMUNES: Ambien Qu le debo informar a mi profesional de la salud antes de tomar este medicamento? Necesitan saber si usted presenta alguno de los [employer] o situaciones: Depresin Antecedentes de abuso de drogas o drogadiccin Si bebe alcohol con frecuencia Enfermedad heptica Enfermedad pulmonar o respiratoria Miastenia grave Apnea del sueo Caminar (sonambulismo), conducir, comer o hacer otra actividad mientras no est completamente despierto despus de usar un medicamento para dormir Ideas, planes o intento de suicidio; si usted o alguien de su familia ha intentado suicidarse previamente Una reaccin alrgica o inusual al zolpidem, a otros medicamentos, alimentos, colorantes o conservantes Si est embarazada o buscando quedar embarazada Si est amamantando a un beb Cmo debo [employer]? Tome este medicamento por va oral con un vaso de agua. Siga las instrucciones de la etiqueta del [place]. Es mejor tomar este medicamento con el estmago vaco y solo cuando est listo para Acupuncturist. No use su medicamento con una frecuencia mayor a la indicada. Si ha estado tomando este medicamento por varias semanas y deja de tomarlo repentinamente, es posible que sufra sntomas desagradables de abstinencia. Es posible que su equipo de atencin le indique reducir gradualmente la dosis. No deje de usar Coca-Cola por su cuenta. Siempre siga las recomendaciones de su equipo de atencin. Su farmacutico le dar una Gua del medicamento especial (MedGuide, nombre en ingls) con cada receta y en cada ocasin que la vuelva a surtir. Asegrese de leer esta informacin cada vez cuidadosamente. Hable con su equipo de atencin sobre el uso  de este medicamento en nios. Puede requerir atencin especial. Sobredosis: Pngase en contacto inmediatamente con un centro toxicolgico o una sala de urgencia si usted cree que haya tomado demasiado medicamento. ATENCIN: [employer] es solo para usted. No comparta este medicamento con nadie. Qu sucede si me olvido de una dosis? No se aplica en este caso. Este medicamento solo debe usarse inmediatamente antes de irse a dormir. No se administre dosis adicionales o dobles. Qu puede interactuar con este medicamento? Alcohol Antihistamnicos para alergia, tos y resfro Ciertos medicamentos para la ansiedad o para dormir Ciertos medicamentos para la depresin, tales como amitriptilina, fluoxetina, sertralina Ciertos medicamentos para infecciones micticas, tales como ketoconazol e itraconazol Ciertos medicamentos para convulsiones, tales como fenobarbital, primidona Ciprofloxacino Suplementos dietticos para dormir, tales como valeriana o kava kava Anestsicos generales, tales como halotano, isoflurano, metoxiflurano, propofol Anestsicos locales, tales como lidocana, pramoxina, tetracana Medicamentos para relajar los msculos antes de una ciruga Medicamentos narcticos para Conservation officer, historic buildings Fenotiazinas, tales como clorpromazina, Musician, Government social research officer, tioridazina Rifampicina Puede ser que esta lista no menciona todas las posibles interacciones. Informe a su profesional de [employer]	[place] de [employer] Corporation productos a base de hierbas, medicamentos de [place] o suplementos nutritivos que est tomando. Si usted fuma, consume bebidas alcohlicas o si utiliza drogas ilegales, indqueselo tambin a su profesional de [employer]	[place]. Algunas sustancias pueden interactuar con su medicamento. A qu debo estar atento al usar Coca-Cola? Visite a su equipo de atencin para que revise su evolucin peridicamente. Mantenga un horario de sueo  regular al acostarse todas las noches a aproximadamente la  misma hora. Evite las bebidas que contengan cafena al final de la tarde. Cuando se usan medicamentos para dormir todas las noches durante ms de unas pocas semanas, es posible que dejen de funcionar. Hable con su equipo de atencin si todava tiene problemas para dormir. Despus de [employer], es posible que se levante de la cama y que haga una actividad que no sepa que est haciendo. La maana siguiente, quizs no lo recuerde. Las actividades incluyen conducir un automvil ("conducir dormido"), cocinar y comer, hablar por telfono, tener actividades sexuales y Writer dormido. Han ocurrido lesiones graves. Deje de usar el medicamento y llame a su equipo de atencin de inmediato si descubre que ha hecho alguna de esas actividades. No use este medicamento si bebi alcohol esa noche. No lo use si ya ha tomado otro medicamento para dormir. El riesgo de Optometrist estas actividades mientras est dormido ser mayor. Espere durante al menos 8 horas despus de usar una dosis para conducir o Optometrist otras actividades que requieran estar en estado de alerta total. No use este medicamento a menos que Education administrator en la cama durante toda la noche (7 a 8 horas) antes de que deba regresar a sus actividades. Es posible que tenga una disminucin de su nivel de Hydrographic surveyor mental el da despus del uso, incluso si siente que est totalmente despierto. Informe a su equipo de atencin Solicitor actividades que requieran que est totalmente alerta, tales como conducir, Games developer siguiente. No se siente ni se ponga de pie con rapidez despus de usar este medicamento, especialmente si es un paciente de edad avanzada. Esto reduce el riesgo de mareos o [employer]. Si usted o su familia notan algn cambio en su conducta, como empeoramiento de la depresin, ideas de [place], ansiedad, otros pensamientos inusuales o perturbadores, o prdida de [place], llame a su equipo de atencin de inmediato. Despus de dejar de  usar Coca-Cola, es posible que tenga problemas para conciliar el sueo. Esto se llama insomnio rebote. Este problema generalmente desaparece por s solo despus de 1 o 2 noches. Qu efectos secundarios puedo tener al [employer] este medicamento? Efectos secundarios que debe informar a su equipo de atencin tan pronto como sea posible: Reacciones alrgicas: erupcin cutnea, comezn/picazn, urticaria, hinchazn de la cara, los labios, la lengua o la garganta Cambios en la visin tales como visin borrosa, ver halos alrededor [employer] Technologies, prdida de visin Depresin del sistema nervioso central: respiracin lenta o superficial, falta de aire, sensacin de desmayo, mareos, confusin, dificultad para mantenerse despierto Cambios en el estado de nimo y el comportamiento: ansiedad, nerviosismo, confusin, alucinaciones, irritabilidad, hostilidad, ideas suicidas o de autolesionarse, empeoramiento del Octavia de nimo, sensacin de depresin Comportamiento inusual durante el sueo o actividades que no recuerda, tales TEFL teacher, comer o Optometrist actividades sexuales Efectos secundarios que generalmente no requieren atencin mdica (debe informarlos a su equipo de atencin si persisten o si son molestos): Diarrea Teacher, English as a foreign language despus del uso Dolor de cabeza Puede ser que esta lista no menciona todos los posibles efectos secundarios. Comunquese a su mdico por asesoramiento mdico [employer] Inc. Usted puede informar los efectos secundarios a la FDA por telfono al 1-800-FDA-1088. Dnde debo guardar mi medicina? Mantenga fuera del alcance de nios y Copy. Existe la posibilidad de abusar de Coca-Cola. Mantenga su medicamento en un lugar seguro para protegerlo contra robos. No comparta este medicamento con nadie. Es  peligroso vender o Associate Professor, y est prohibido por la ley. Guarde a [employer], entre 20 y 63 grados Celsius (77 y 72  grados Fahrenheit). Este medicamento puede causar Ardelia Mems sobredosis accidental y la muerte si lo toman otros adultos, nios o Copy. Mezcle todo el medicamento sin usar con una sustancia como piedras sanitarias para gatos o posos (residuos) de caf. Luego, deseche el [employer] un recipiente cerrado, como una bolsa sellada o una lata de caf con tapa. No utilice este medicamento despus de la fecha de vencimiento. ATENCIN: Este folleto es un resumen. Puede ser que no cubra toda la posible informacin. Si usted tiene preguntas acerca de esta medicina, consulte con su mdico, su farmacutico o su profesional de Technical sales engineer.  2022 Elsevier/Gold Standard (2020-11-29 00:00:00)   Plan de accin para la diabetes mellitus Diabetes Mellitus Action Plan Seguir un plan de accin para la diabetes es una forma de controlar sus sntomas de diabetes (diabetes mellitus). El plan se codifica con colores para ayudarlo a comprender qu acciones necesita tomar en funcin de los sntomas que est teniendo. Si tiene sntomas pertenecientes a la zona roja, necesita buscar atencin mdica inmediatamente. Si tiene sntomas pertenecientes a la zona amarilla, est teniendo problemas. Si tiene sntomas pertenecientes a la zona verde, significa que se Brewing technologist. Aprender y comprender la diabetes puede Radiation protection practitioner. Siga el plan que elabor con el mdico. Conozca el rango deseado para su nivel de azcar en la sangre (glucosa), y revise su plan de tratamiento con su mdico en cada visita. El rango deseado para mi nivel de azcar en la sangre es __________________________ mg/dl. Zona roja Obtenga ayuda de inmediato si observa cualquiera de estos sntomas: Un resultado de azcar en la sangre que est por debajo de 54 mg/dl (3 mmol/l). Un nivel de azcar en la sangre mayor o igual que 240 mg/dl (13,3 mmol/l) durante 2 das seguidos. Confusin o dificultad para pensar con claridad. Dificultad para respirar. Malestar o  fiebre durante 2 o ms [employer] no mejora. Niveles moderados o altos de cetonas en la [place]. Sentirse cansado o sin energa. Si tiene cualquiera de los sntomas pertenecientes a la zona roja, no espere para ver si desaparecen. Solicite atencin mdica de inmediato. Comunquese con el servicio de emergencias de su localidad (911 en los [place]). No conduzca por sus propios medios Principal Financial. Si tiene un nivel de azcar en la sangre muy bajo (hipoglucemia grave) y no puede ingerir ningn alimento ni bebida, tal vez necesite glucagn. Asegrese de que un familiar o amigo sepa controlarle el nivel de azcar en la sangre y aplicarle glucagn. Puede necesitar tratamiento en un hospital para esta afeccin. Zona amarilla Si tiene alguno de los siguientes sntomas, su diabetes no est controlada y usted Product manager algunos cambios: Un nivel de azcar en la sangre mayor o igual que 240 mg/dl (13,3 mmol/l) durante 2 das seguidos. Un resultado de azcar en la sangre que est por debajo de 70 mg/dl (3,9 mmol/l). Otros sntomas de hipoglucemia, como: Temblores o sensacin de desvanecimiento. Confusin o irritabilidad. Sensacin de [place]. Latidos cardacos acelerados. Si tiene algn sntoma de la zona amarilla: Trate la hipoglucemia comiendo o bebiendo 15 gramos de hidratos de carbono de accin rpida. Siga las regla 15:15: Consuma 15 gramos de hidratos de carbono de accin rpida, como: 1 pomo de glucosa en gel. 4 comprimidos de glucosa. 4 onzas (120 ml) de jugo de frutas. 4 onzas (120 ml) de refresco comn (no  diettico). Controle su nivel de azcar en la sangre 15 minutos despus de ingerir el hidrato de carbono. Si este nuevo nivel de azcar en la sangre todava es igual o menor que 70 mg/dl (3,9 mmol/l), ingiera nuevamente 15 gramos de un hidrato de carbono. Si el nivel de azcar en la sangre no supera los 70 mg/dl (3,9 mmol/l) despus de 3 intentos, solicite ayuda mdica de  inmediato. Ingiera una comida o un refrigerio en el transcurso de 1 hora despus de que el nivel de azcar en la sangre se haya normalizado. Siga tomando los medicamentos diarios como se lo haya indicado el mdico. Controle su nivel de azcar en la sangre con ms frecuencia que lo hara normalmente. [employer]. Llame al mdico si tiene problemas para Advertising account executive de azcar en la sangre dentro del rango deseado.  Zona verde Estos signos significan que se encuentra bien y puede continuar haciendo lo que est haciendo para Chief Technology Officer la diabetes: Su nivel de azcar en la sangre est en el rango deseado. En la [employer], el nivel de Location manager en la sangre antes de una comida (preprandial) debera ser de 80 a 130 mg/dl (de 4.4 a 7.2 mmol/l). Se siente bien, y pueda volver a [employer] Corporation diarias. Si se encuentra en la zona verde, contine controlando su diabetes como se lo haya indicado el mdico. Para hacer esto: Siga una dieta saludable. Haga ejercicio regularmente. Controle su nivel de azcar en la sangre como se lo haya indicado el mdico. Tome los medicamentos como se lo haya indicado el mdico.  Dnde buscar ms informacin American Diabetes Association (ADA) (Asociacin Estadounidense de la Diabetes): diabetes.org Association of Diabetes Care & Education Specialists (ADCES) (Asociacin de Especialistas en Atencin y Educacin sobre la Diabetes): diabeteseducator.org Resumen Seguir un plan de accin para la diabetes, es una forma de controlar sus sntomas de diabetes. El plan se codifica con colores para ayudarlo a comprender qu acciones necesita tomar en funcin de los sntomas que est teniendo. Siga el plan que elabor con el mdico. Asegrese de conocer su nivel deseado de azcar en la sangre. Revise el plan de tratamiento con el mdico en todas las consultas. Esta informacin no tiene Marine scientist el consejo del mdico. Asegrese de hacerle al  mdico cualquier pregunta que tenga. Document Revised: 12/09/2019 Document Reviewed: 12/09/2019 Elsevier Patient Education  [place].

## 2021-04-04 ENCOUNTER — Other Ambulatory Visit: Payer: Self-pay | Admitting: Family Medicine

## 2021-04-04 ENCOUNTER — Other Ambulatory Visit: Payer: Self-pay

## 2021-04-04 DIAGNOSIS — E119 Type 2 diabetes mellitus without complications: Secondary | ICD-10-CM

## 2021-04-04 LAB — CMP AND LIVER
ALT: 23 IU/L (ref 0–44)
AST: 22 IU/L (ref 0–40)
Albumin: 4.6 g/dL (ref 4.0–5.0)
Alkaline Phosphatase: 106 IU/L (ref 44–121)
BUN: 11 mg/dL (ref 6–24)
Bilirubin Total: 0.4 mg/dL (ref 0.0–1.2)
Bilirubin, Direct: 0.13 mg/dL (ref 0.00–0.40)
CO2: 23 mmol/L (ref 20–29)
Calcium: 9.1 mg/dL (ref 8.7–10.2)
Chloride: 105 mmol/L (ref 96–106)
Creatinine, Ser: 0.76 mg/dL (ref 0.76–1.27)
Glucose: 143 mg/dL — ABNORMAL HIGH (ref 70–99)
Potassium: 4.9 mmol/L (ref 3.5–5.2)
Sodium: 141 mmol/L (ref 134–144)
Total Protein: 6.8 g/dL (ref 6.0–8.5)
eGFR: 114 mL/min/{1.73_m2} (ref >59)

## 2021-04-04 MED ORDER — METFORMIN HCL 500 MG PO TABS
ORAL_TABLET | Freq: Every day | ORAL | 2 refills | Status: DC
  Filled 2021-04-04: qty 30, 30d supply, fill #0
  Filled 2021-05-04: qty 30, 30d supply, fill #1
  Filled 2021-06-04: qty 30, 30d supply, fill #0
  Filled 2021-07-05: qty 30, 30d supply, fill #1
  Filled 2021-08-02: qty 30, 30d supply, fill #2
  Filled 2021-09-04: qty 30, 30d supply, fill #3

## 2021-04-09 ENCOUNTER — Other Ambulatory Visit: Payer: Self-pay

## 2021-04-12 ENCOUNTER — Telehealth: Payer: Self-pay | Admitting: Family Medicine

## 2021-04-12 NOTE — Telephone Encounter (Signed)
Drew [name] is a 45 year old male with a medical history significant for type 2 diabetes mellitus that presented for follow-up of chronic conditions.  During office visit, patient's hemoglobin A1c was found to be 6.7, which is slightly improved from previous.  Recommend a low-fat, low carbohydrate diet.  Also, continue metformin 500 mg daily with breakfast.  Please review patient's medications once again if necessary, medications reviewed at length and office.  We just need to make sure the patient has a clear understanding.  Otherwise, the patient will follow-up in 6 months as scheduled.   [name]  APRN, MSN, FNP-C [facility] [address] [place], [place] [id number] [phone]

## 2021-05-04 ENCOUNTER — Other Ambulatory Visit: Payer: Self-pay

## 2021-05-15 ENCOUNTER — Encounter: Payer: Self-pay | Admitting: Family Medicine

## 2021-05-15 ENCOUNTER — Other Ambulatory Visit: Payer: Self-pay

## 2021-05-15 ENCOUNTER — Ambulatory Visit (INDEPENDENT_AMBULATORY_CARE_PROVIDER_SITE_OTHER): Payer: Self-pay | Admitting: Family Medicine

## 2021-05-15 VITALS — BP 126/84 | HR 63 | Temp 97.9°F | Ht 66.0 in | Wt 191.2 lb

## 2021-05-15 DIAGNOSIS — M79622 Pain in left upper arm: Secondary | ICD-10-CM

## 2021-05-15 DIAGNOSIS — Z789 Other specified health status: Secondary | ICD-10-CM

## 2021-05-15 DIAGNOSIS — D1722 Benign lipomatous neoplasm of skin and subcutaneous tissue of left arm: Secondary | ICD-10-CM

## 2021-05-15 MED ORDER — NAPROXEN 500 MG PO TABS
500.0000 mg | ORAL_TABLET | Freq: Two times a day (BID) | ORAL | 0 refills | Status: DC
  Filled 2021-05-15: qty 60, 30d supply, fill #0

## 2021-05-15 NOTE — Patient Instructions (Addendum)
Refrain from lifting items greater than 20 pounds.  Extra strength tylenol and naproxen as directed.    You will need to

## 2021-05-21 NOTE — Progress Notes (Signed)
Patient [name]   Established Patient Office Visit  Subjective:  Patient ID: Drew [name], male    DOB: 1975/12/02  Age: 46 y.o. MRN: [id number]  CC:  Chief Complaint  Patient presents with   Follow-up    Pt is here today for his follow up visit to discuss his x-ray results on his left ring finger.    HPI Drew [name] is a 46 year old male that presents accompanied by wife with complaints of left upper extremity pain over the past 6 months.  Patient denies any injury or falls.  He reports ongoing swelling to left upper extremity.  He also endorses pain.  His pain intensity is 5/10 right now.  Pain is characterized as intermittent and aching.  Patient states that pain is worsened with repetitive movements at work.  He has not identified any alleviating factors.  Patient primarily [place], video interpreter is utilized to assist with communication.  Past Medical History:  Diagnosis Date   Facial nerve palsy    Stroke ([place])    Type 2 diabetes mellitus (HCC)     Past Surgical History:  Procedure Laterality Date   APPENDECTOMY      Family History  Problem Relation Age of Onset   Stroke Father     Social History   Socioeconomic History   Marital status: Married    Spouse name: Not on file   Number of children: Not on file   Years of education: Not on file   Highest education level: Not on file  Occupational History   Not on file  Tobacco Use   Smoking status: Never   Smokeless tobacco: Never   Tobacco comments:    Pt states he smokes once or twice a month  Vaping Use   Vaping Use: Never used  Substance and Sexual Activity   Alcohol use: Yes    Comment: weekly   Drug use: No   Sexual activity: Yes    Birth control/protection: None  Other Topics Concern   Not on file  Social History Narrative   Not on file   Social Determinants of Health   Financial Resource Strain: Not on file  Food Insecurity: Not on file   Transportation Needs: Not on file  Physical Activity: Not on file  Stress: Not on file  Social Connections: Not on file  Intimate Partner Violence: Not on file    Outpatient Medications Prior to Visit  Medication Sig Dispense Refill   aspirin 81 MG EC tablet Take 1 tablet (81 mg total) by mouth daily. 90 tablet 3   atorvastatin (LIPITOR) 40 MG tablet Take 1 tablet (40 mg total) by mouth daily. 90 tablet 3   metFORMIN (GLUCOPHAGE) 500 MG tablet TAKE 1 TABLET (500 MG TOTAL) BY MOUTH DAILY WITH BREAKFAST. 90 tablet 2   cetirizine (ZYRTEC) 10 MG tablet Take 1 tablet (10 mg total) by mouth daily. (Patient not taking: Reported on 04/03/2021) 30 tablet 11   fluticasone (FLONASE) 50 MCG/ACT nasal spray Place 2 sprays into both nostrils daily. (Patient not taking: Reported on 04/03/2021) 16 g 6   zolpidem (AMBIEN) 5 MG tablet Take 1 tablet (5 mg total) by mouth at bedtime as needed for sleep. (Patient not taking: Reported on 05/15/2021) 15 tablet 1   No facility-administered medications prior to visit.    No Known Allergies  ROS Review of Systems  Constitutional: Negative.   HENT: Negative.    Eyes: Negative.   Respiratory: Negative.  Cardiovascular: Negative.   Musculoskeletal:        Left upper extremity pain  Psychiatric/Behavioral: Negative.       Objective:    Physical Exam Constitutional:      Appearance: Normal appearance.  Cardiovascular:     Rate and Rhythm: Normal rate and regular rhythm.  Pulmonary:     Effort: Pulmonary effort is normal.  Musculoskeletal:     Left upper arm: Swelling and tenderness present.       Arms:  Skin:    General: Skin is warm.  Neurological:     General: No focal deficit present.     Mental Status: Mental status is at baseline.    BP 126/84    Pulse 63    Temp 97.9 F (36.6 C)    Ht '5\' 6"'  (1.676 m)    Wt 191 lb 3.2 oz (86.7 kg)    SpO2 98%    BMI 30.86 kg/m  Wt Readings from Last 3 Encounters:  05/15/21 191 lb 3.2 oz (86.7 kg)   04/03/21 189 lb 6 oz (85.9 kg)  09/26/20 194 lb 0.8 oz (88 kg)     Health Maintenance Due  Topic Date Due   Hepatitis C Screening  Never done   COVID-19 Vaccine (3 - Booster for Pfizer series) 10/14/2019   OPHTHALMOLOGY EXAM  12/14/2019    There are no preventive care reminders to display for this patient.  No results found for: TSH Lab Results  Component Value Date   WBC 6.3 06/29/2019   HGB 15.0 06/29/2019   HCT 42.7 06/29/2019   MCV 92 06/29/2019   PLT 209 06/29/2019   Lab Results  Component Value Date   NA 141 04/03/2021   K 4.9 04/03/2021   CO2 23 04/03/2021   GLUCOSE 143 (H) 04/03/2021   BUN 11 04/03/2021   CREATININE 0.76 04/03/2021   BILITOT 0.4 04/03/2021   ALKPHOS 106 04/03/2021   AST 22 04/03/2021   ALT 23 04/03/2021   PROT 6.8 04/03/2021   ALBUMIN 4.6 04/03/2021   CALCIUM 9.1 04/03/2021   ANIONGAP 11 12/17/2017   EGFR 114 04/03/2021   Lab Results  Component Value Date   CHOL 150 09/26/2020   Lab Results  Component Value Date   HDL 51 09/26/2020   Lab Results  Component Value Date   LDLCALC 83 09/26/2020   Lab Results  Component Value Date   TRIG 85 09/26/2020   Lab Results  Component Value Date   CHOLHDL 2.9 09/26/2020   Lab Results  Component Value Date   HGBA1C 6.7 (A) 04/03/2021   HGBA1C 6.7 04/03/2021   HGBA1C 6.7 (A) 04/03/2021   HGBA1C 6.7 04/03/2021      Assessment & Plan:   Problem List Items Addressed This Visit   None Visit Diagnoses     Lipoma of left upper extremity    -  Primary   Language barrier to communication       Pain of left upper arm       Relevant Medications   naproxen (NAPROSYN) 500 MG tablet   Other Relevant Orders   CT HUMERUS LEFT WO CONTRAST       Meds ordered this encounter  Medications   naproxen (NAPROSYN) 500 MG tablet    Sig: Take 1 tablet (500 mg total) by mouth 2 (two) times daily with a meal.    Dispense:  60 tablet    Refill:  0    Order Specific Question:  Supervising  Provider    Answer:   Tresa Garter [[id number]]   7. Lipoma of left upper extremity Range, movable, tender elevation to posterior aspect the left upper extremity. Area become painful when patient is working.  He has very repetitive movements at his job.  Recommend naproxen 500 mg twice daily as needed.  We will also review x-ray result.  We will continue surveillance, no surgical intervention at this time. 2. Language barrier to communication Utilized video interpreter to assist with communication  3. Pain of left upper arm  - CT HUMERUS LEFT WO CONTRAST; Future - naproxen (NAPROSYN) 500 MG tablet; Take 1 tablet (500 mg total) by mouth 2 (two) times daily with a meal.  Dispense: 60 tablet; Refill: 0    Follow-up: Return in about 3 months (around 08/13/2021), or if symptoms worsen or fail to improve.   [name]  APRN, MSN, FNP-C [facility] [address] [place], [place] [address] [phone]

## 2021-06-04 ENCOUNTER — Other Ambulatory Visit: Payer: Self-pay

## 2021-07-05 ENCOUNTER — Other Ambulatory Visit: Payer: Self-pay

## 2021-08-02 ENCOUNTER — Other Ambulatory Visit: Payer: Self-pay

## 2021-08-23 ENCOUNTER — Emergency Department (HOSPITAL_BASED_OUTPATIENT_CLINIC_OR_DEPARTMENT_OTHER)
Admission: EM | Admit: 2021-08-23 | Discharge: 2021-08-24 | Disposition: A | Discharge status: Home / Self Care | Payer: Self-pay | Attending: Emergency Medicine | Admitting: Emergency Medicine

## 2021-08-23 ENCOUNTER — Other Ambulatory Visit: Payer: Self-pay

## 2021-08-23 ENCOUNTER — Encounter (HOSPITAL_BASED_OUTPATIENT_CLINIC_OR_DEPARTMENT_OTHER): Payer: Self-pay | Admitting: Urology

## 2021-08-23 DIAGNOSIS — R531 Weakness: Secondary | ICD-10-CM | POA: Insufficient documentation

## 2021-08-23 DIAGNOSIS — Z7982 Long term (current) use of aspirin: Secondary | ICD-10-CM | POA: Insufficient documentation

## 2021-08-23 DIAGNOSIS — R229 Localized swelling, mass and lump, unspecified: Secondary | ICD-10-CM | POA: Insufficient documentation

## 2021-08-23 NOTE — ED Provider Notes (Signed)
? ?[facility]  ?Provider Note ? ?CSN: [id number] ?Arrival date & time: 08/23/21 2225 ? ?History ?Chief Complaint  ?Patient presents with  ? Abscess  ? ? ?Drew [name] is a 46 y.o. male here with family. Offered video interpreter but declines and family at bedside assists with translation. He reports a 'lump' in his L arm for 1-2 years, feels like it is getting larger. Occasionally has some weakness/pain in that arm when lifting heavy objects. No acute change today. He saw his PCP for same in January and diagnosed with lipoma. Patient concerned this might be an abscess.  ? ?Home Medications ?Prior to Admission medications   ?Medication Sig Start Date End Date Taking? Authorizing Provider  ?aspirin 81 MG EC tablet Take 1 tablet (81 mg total) by mouth daily. 03/28/20   Dorena Dew, FNP  ?atorvastatin (LIPITOR) 40 MG tablet Take 1 tablet (40 mg total) by mouth daily. 09/26/20 09/26/21  Dorena Dew, FNP  ?cetirizine (ZYRTEC) 10 MG tablet Take 1 tablet (10 mg total) by mouth daily. ?Patient not taking: Reported on 04/03/2021 09/26/20   Dorena Dew, FNP  ?fluticasone (FLONASE) 50 MCG/ACT nasal spray Place 2 sprays into both nostrils daily. ?Patient not taking: Reported on 04/03/2021 09/26/20   Dorena Dew, FNP  ?metFORMIN (GLUCOPHAGE) 500 MG tablet TAKE 1 TABLET (500 MG TOTAL) BY MOUTH DAILY WITH BREAKFAST. 04/04/21 04/04/22  Dorena Dew, FNP  ?naproxen (NAPROSYN) 500 MG tablet Take 1 tablet (500 mg total) by mouth 2 (two) times daily with a meal. 05/15/21   Dorena Dew, FNP  ?zolpidem (AMBIEN) 5 MG tablet Take 1 tablet (5 mg total) by mouth at bedtime as needed for sleep. ?Patient not taking: Reported on 05/15/2021 04/03/21   Dorena Dew, FNP  ? ? ? ?Allergies    ?Patient has no known allergies. ? ? ?Review of Systems   ?Review of Systems ?Please see HPI for pertinent positives and negatives ? ?Physical Exam ?BP (!) 161/93   Pulse 71   Temp 98.4 ?F (36.9 ?C)  (Oral)   Resp 18   Ht '5\' 9"'$  (1.753 m)   Wt 86.2 kg   SpO2 100%   BMI 28.06 kg/m?  ? ?Physical Exam ?Vitals and nursing note reviewed.  ?HENT:  ?   Head: Normocephalic.  ?   Nose: Nose normal.  ?Eyes:  ?   Extraocular Movements: Extraocular movements intact.  ?Pulmonary:  ?   Effort: Pulmonary effort is normal.  ?Musculoskeletal:     ?   General: Normal range of motion.  ?   Cervical back: Neck supple.  ?   Comments: Small 1cm subcutaneous nodule in LUE just above the medial epicondyle, not tender to palpation. Could be lipoma vs synovial cyst  ?Skin: ?   Findings: No rash (on exposed skin).  ?Neurological:  ?   Mental Status: He is alert and oriented to person, place, and time.  ?   Sensory: No sensory deficit.  ?   Motor: No weakness.  ?Psychiatric:     ?   Mood and Affect: Mood normal.  ? ? ?ED Results / Procedures / Treatments   ?EKG ?None ? ?Procedures ?Procedures ? ?Medications Ordered in the ED ?Medications - No data to display ? ?Initial Impression and Plan ? Patient with a small chronic subcutaneous nodule. No signs of infection/abscess. No neuro deficits. Recommend outpatient follow up for further evaluation.  ? ?ED Course  ? ?  ? ? ?  MDM Rules/Calculators/A&P ?Medical Decision Making ?Problems Addressed: ?Skin nodule: chronic illness or injury ? ? ? ?Final Clinical Impression(s) / ED Diagnoses ?Final diagnoses:  ?Skin nodule  ? ? ?Rx / DC Orders ?ED Discharge Orders   ? ? None  ? ?  ? ?  ?Truddie Hidden, MD ?08/24/21 0001 ? ?

## 2021-08-23 NOTE — ED Triage Notes (Signed)
Left arm abscess in upper arm x 1 year ?States weakness in the arm now  ? ?

## 2021-08-27 ENCOUNTER — Ambulatory Visit (HOSPITAL_BASED_OUTPATIENT_CLINIC_OR_DEPARTMENT_OTHER)
Admission: RE | Admit: 2021-08-27 | Discharge: 2021-08-27 | Disposition: A | Discharge status: Home / Self Care | Payer: Self-pay | Source: Ambulatory Visit | Attending: Family Medicine | Admitting: Family Medicine

## 2021-08-27 ENCOUNTER — Ambulatory Visit: Payer: Self-pay

## 2021-08-27 ENCOUNTER — Ambulatory Visit (INDEPENDENT_AMBULATORY_CARE_PROVIDER_SITE_OTHER): Payer: Self-pay | Admitting: Family Medicine

## 2021-08-27 VITALS — BP 120/80 | Ht 69.0 in | Wt 190.0 lb

## 2021-08-27 DIAGNOSIS — M25522 Pain in left elbow: Secondary | ICD-10-CM | POA: Insufficient documentation

## 2021-08-27 NOTE — Patient Instructions (Addendum)
Nice to meet you ?I'll call with the ultrasound results.  ?Please try tylenol in the interim   ?Please send me a message in MyChart with any questions or updates.  ?Follow up will depend on results. .  ? ?--Dr. Raeford Razor ? ? ?Encantado de conocerlo ?Llamar? con los resultados de la ecograf?a. ?Por favor, pruebe Tylenol en el ?nterin ?Env?eme un mensaje en MyChart con cualquier pregunta o actualizaci?n. ?El seguimiento depender? de los [place]. Marland Kitchen ?

## 2021-08-27 NOTE — Assessment & Plan Note (Signed)
Acute on chronic in nature.  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  No signs of infection on exam. ?-Counseled on home exercise therapy and supportive care. ?-Upper extremity Doppler duplex ?-Could consider MRI with contrast  ?

## 2021-08-27 NOTE — Progress Notes (Signed)
?  Drew [name] - 46 y.o. male MRN [id number]  Date of birth: 09-29-1975 ? ?SUBJECTIVE:  Including CC & ROS.  ?No chief complaint on file. ? ? ?Drew [name] is a 46 y.o. male that is here for acute on chronic left elbow pain.  He feels a nodule in the inner aspect of his elbow.  Seems of gotten worse here recently.  He reports an abscess in there that was treated with antibiotics but no surgery.  No injury.  Pain is localized to the distal humerus and elbow. ? ?Interview was conducted with a [place] interpreter. ?Review of the note from 04/03/2021 shows that he was complaining of this area. ?Review of the note from 05/15/2021 shows that a CT of the humerus was ordered has naproxen prescribed. ?Review of the emergency department note from 4/13 shows he was counseled supportive care. ? ? ?Review of Systems ?See HPI  ? ?HISTORY: Past Medical, Surgical, Social, and Family History Reviewed & Updated per EMR.   ?Pertinent Historical Findings include: ? ?Past Medical History:  ?Diagnosis Date  ? Facial nerve palsy   ? Stroke [facility])   ? Type 2 diabetes mellitus ([place])   ? ? ?Past Surgical History:  ?Procedure Laterality Date  ? APPENDECTOMY    ? ? ? ?PHYSICAL EXAM:  ?VS: BP 120/80   Ht '5\' 9"'$  (1.753 m)   Wt 190 lb (86.2 kg)   BMI 28.06 kg/m?  ?Physical Exam ?Gen: NAD, alert, cooperative with exam, well-appearing ?MSK:  ?Neurovascularly intact   ? ?Limited ultrasound: Left elbow: ? ?Normal-appearing ulnar nerve at the cubital tunnel.  The ulnar nerve does appear to be larger with proximal scanning. ?There is increased vascularity in the area to be concerned with a arteriovenous malformation. ?No elbow effusion. ?Normal-appearing radial head. ? 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 ? ?Ultrasound and interpretation by Clearance Coots, MD ? ? ? ?ASSESSMENT & PLAN:  ? ?Left elbow pain ?Acute on chronic in nature.  He either has a dilated arteriovenous malformation in the area or a enlarged  ulnar nerve.  No signs of infection on exam. ?-Counseled on home exercise therapy and supportive care. ?-Upper extremity Doppler duplex ?-Could consider MRI with contrast  ? ? ? ? ?

## 2021-08-30 ENCOUNTER — Other Ambulatory Visit: Payer: Self-pay

## 2021-08-30 ENCOUNTER — Telehealth: Payer: Self-pay | Admitting: Family Medicine

## 2021-08-30 MED ORDER — PREDNISONE 5 MG PO TABS
ORAL_TABLET | ORAL | 0 refills | Status: DC
  Filled 2021-08-30: qty 21, 6d supply, fill #0

## 2021-08-30 NOTE — Telephone Encounter (Signed)
Informed of results. Will try prednisone and follow up.  ?Spanish phone interpretor was used.  ? ?Rosemarie Ax, MD ?[facility] ?08/30/2021, 3:34 PM ? ?

## 2021-08-31 ENCOUNTER — Other Ambulatory Visit: Payer: Self-pay

## 2021-09-04 ENCOUNTER — Other Ambulatory Visit: Payer: Self-pay

## 2021-09-14 ENCOUNTER — Other Ambulatory Visit: Payer: Self-pay

## 2021-09-14 ENCOUNTER — Encounter (HOSPITAL_BASED_OUTPATIENT_CLINIC_OR_DEPARTMENT_OTHER): Payer: Self-pay | Admitting: Emergency Medicine

## 2021-09-14 ENCOUNTER — Emergency Department (HOSPITAL_BASED_OUTPATIENT_CLINIC_OR_DEPARTMENT_OTHER)
Admission: EM | Admit: 2021-09-14 | Discharge: 2021-09-14 | Disposition: A | Discharge status: Home / Self Care | Payer: Self-pay | Attending: Emergency Medicine | Admitting: Emergency Medicine

## 2021-09-14 DIAGNOSIS — X18XXXA Contact with other hot metals, initial encounter: Secondary | ICD-10-CM | POA: Insufficient documentation

## 2021-09-14 DIAGNOSIS — S0990XA Unspecified injury of head, initial encounter: Secondary | ICD-10-CM

## 2021-09-14 DIAGNOSIS — S0101XA Laceration without foreign body of scalp, initial encounter: Secondary | ICD-10-CM | POA: Insufficient documentation

## 2021-09-14 DIAGNOSIS — Z7982 Long term (current) use of aspirin: Secondary | ICD-10-CM | POA: Insufficient documentation

## 2021-09-14 MED ORDER — LIDOCAINE-EPINEPHRINE (PF) 2 %-1:200000 IJ SOLN
INTRAMUSCULAR | Status: AC
  Administered 2021-09-14: 20 mL
  Filled 2021-09-14: qty 20

## 2021-09-14 MED ORDER — BACITRACIN ZINC 500 UNIT/GM EX OINT: TOPICAL_OINTMENT | Freq: Once | CUTANEOUS | Status: AC

## 2021-09-14 MED ORDER — LIDOCAINE-EPINEPHRINE (PF) 2 %-1:200000 IJ SOLN: 10.0000 mL | Freq: Once | INTRAMUSCULAR | Status: DC

## 2021-09-14 MED ORDER — ACETAMINOPHEN 500 MG PO TABS
1000.0000 mg | ORAL_TABLET | Freq: Once | ORAL | Status: AC
  Administered 2021-09-14: 1000 mg via ORAL
  Filled 2021-09-14: qty 2

## 2021-09-14 MED ORDER — LIDOCAINE-EPINEPHRINE (PF) 2 %-1:200000 IJ SOLN: 20.0000 mL | Freq: Once | INTRAMUSCULAR | Status: AC

## 2021-09-14 NOTE — ED Provider Notes (Signed)
?[facility] EMERGENCY DEPARTMENT ?Provider Note ? ? ?CSN: [id number] ?Arrival date & time: 09/14/21  1738 ? ?  ? ?History ? ?No chief complaint on file. ? ? ?Drew [name] is a 46 y.o. male.  He is here for evaluation of a scalp laceration that occurred earlier today.  He said he was struck by a piece of metal.  He is not sure if he lost consciousness but if so it was very brief.  No other injuries or complaints.  No nausea or vomiting. ? ?The history is provided by the patient and the spouse. The history is limited by a language barrier. A language interpreter was used (ipad spanish).  ?Head Injury ?Location:  L temporal ?Time since incident:  2 hours ?Mechanism of injury: direct blow   ?Pain details:  ?  Quality:  Throbbing ?  Severity:  Severe ?  Timing:  Constant ?  Progression:  Unchanged ?Chronicity:  New ?Relieved by:  None tried ?Worsened by:  Nothing ?Ineffective treatments:  None tried ?Associated symptoms: headache   ?Associated symptoms: no blurred vision, no focal weakness, no nausea, no neck pain and no vomiting   ? ?  ? ?Home Medications ?Prior to Admission medications   ?Medication Sig Start Date End Date Taking? Authorizing Provider  ?aspirin 81 MG EC tablet Take 1 tablet (81 mg total) by mouth daily. 03/28/20   Dorena Dew, FNP  ?atorvastatin (LIPITOR) 40 MG tablet Take 1 tablet (40 mg total) by mouth daily. 09/26/20 10/04/21  Dorena Dew, FNP  ?metFORMIN (GLUCOPHAGE) 500 MG tablet TAKE 1 TABLET (500 MG TOTAL) BY MOUTH DAILY WITH BREAKFAST. 04/04/21 04/04/22  Dorena Dew, FNP  ?naproxen (NAPROSYN) 500 MG tablet Take 1 tablet (500 mg total) by mouth 2 (two) times daily with a meal. 05/15/21   Dorena Dew, FNP  ?predniSONE (DELTASONE) 5 MG tablet Take 6 pills for first day, 5 pills second day, 4 pills third day, 3 pills fourth day, 2 pills the fifth day, and 1 pill sixth day. 08/30/21   Rosemarie Ax, MD  ?   ? ?Allergies    ?Patient has no known allergies.   ? ?Review of  Systems   ?Review of Systems  ?Constitutional:  Negative for fever.  ?HENT:  Negative for sore throat.   ?Eyes:  Negative for blurred vision and visual disturbance.  ?Respiratory:  Negative for shortness of breath.   ?Cardiovascular:  Negative for chest pain.  ?Gastrointestinal:  Negative for abdominal pain, nausea and vomiting.  ?Genitourinary:  Negative for dysuria.  ?Musculoskeletal:  Negative for neck pain.  ?Skin:  Positive for wound.  ?Neurological:  Positive for headaches. Negative for focal weakness.  ? ?Physical Exam ?Updated Vital Signs ?BP (!) 119/106   Pulse 79   Temp 98.5 ?F (36.9 ?C) (Oral)   Resp 17   SpO2 98%  ?Physical Exam ?Vitals and nursing note reviewed.  ?Constitutional:   ?   General: He is not in acute distress. ?   Appearance: Normal appearance. He is well-developed.  ?HENT:  ?   Head: Normocephalic.  ?   Comments: He is approximately 3 cm linear laceration left temporal scalp.  There is no significant scalp depression. ?Eyes:  ?   Conjunctiva/sclera: Conjunctivae normal.  ?Cardiovascular:  ?   Rate and Rhythm: Normal rate and regular rhythm.  ?   Heart sounds: No murmur heard. ?Pulmonary:  ?   Effort: Pulmonary effort is normal. No respiratory distress.  ?  Breath sounds: Normal breath sounds.  ?Abdominal:  ?   Palpations: Abdomen is soft.  ?   Tenderness: There is no abdominal tenderness.  ?Musculoskeletal:     ?   General: No swelling. Normal range of motion.  ?   Cervical back: Neck supple.  ?Skin: ?   General: Skin is warm and dry.  ?   Capillary Refill: Capillary refill takes less than 2 seconds.  ?Neurological:  ?   General: No focal deficit present.  ?   Mental Status: He is alert.  ?   Cranial Nerves: No cranial nerve deficit.  ?   Sensory: No sensory deficit.  ?   Motor: No weakness.  ?   Gait: Gait normal.  ? ? ?ED Results / Procedures / Treatments   ?Labs ?(all labs ordered are listed, but only abnormal results are displayed) ?Labs Reviewed - No data to  display ? ?EKG ?None ? ?Radiology ?No results found. ? ?Procedures ?Marland Kitchen.Laceration Repair ? ?Date/Time: 09/14/2021 6:08 PM ?Performed by: Hayden Rasmussen, MD ?Authorized by: Hayden Rasmussen, MD  ? ?Consent:  ?  Consent obtained:  Verbal ?  Consent given by:  Patient ?  Risks, benefits, and alternatives were discussed: yes   ?  Risks discussed:  Infection, pain, poor cosmetic result, poor wound healing and retained foreign body ?  Alternatives discussed:  No treatment and delayed treatment ?Universal protocol:  ?  Procedure explained and questions answered to patient or proxy's satisfaction: yes   ?  Patient identity confirmed:  Verbally with patient ?Anesthesia:  ?  Anesthesia method:  Local infiltration ?  Local anesthetic:  Lidocaine 2% WITH epi ?Laceration details:  ?  Location:  Scalp ?  Scalp location:  L temporal ?  Length (cm):  3 ?Pre-procedure details:  ?  Preparation:  Patient was prepped and draped in usual sterile fashion ?Treatment:  ?  Area cleansed with:  Shur-Clens ?  Amount of cleaning:  Standard ?  Debridement:  None ?Skin repair:  ?  Repair method:  Staples ?  Number of staples:  4 ?Approximation:  ?  Approximation:  Close ?Repair type:  ?  Repair type:  Simple ?Post-procedure details:  ?  Dressing:  Antibiotic ointment ?  Procedure completion:  Tolerated well, no immediate complications  ? ? ?Medications Ordered in ED ?Medications  ?lidocaine-EPINEPHrine (XYLOCAINE W/EPI) 2 %-1:200000 (PF) injection 10 mL (has no administration in time range)  ? ? ?ED Course/ Medical Decision Making/ A&P ?  ?                        ?Medical Decision Making ?Risk ?OTC drugs. ?Prescription drug management. ? ?46 year old male here for scalp laceration after being struck by a metal edge.  He is awake alert and or other concerning features for intracranial injury.  Wound was repaired.  Wound instructions and head injury instructions given to patient in Spanish.  Return instructions discussed ? ? ? ? ? ? ? ? ?Final  Clinical Impression(s) / ED Diagnoses ?Final diagnoses:  ?Injury of head, initial encounter  ?Laceration of scalp, initial encounter  ? ? ?Rx / DC Orders ?ED Discharge Orders   ? ? None  ? ?  ? ? ?  ?Hayden Rasmussen, MD ?09/15/21 [phone] ? ?

## 2021-09-14 NOTE — ED Triage Notes (Signed)
Laceration to left side of head. Hit head on piece of metal. Denies loc and blood thinners. Bleeding controlled.  ?

## 2021-09-14 NOTE — Discharge Instructions (Signed)
You will need to have the staples removed in 7 to 10 days.  You may shower and wash her hair.  If bleeding continues please hold pressure.  Return if any problems. ?

## 2021-10-02 ENCOUNTER — Encounter: Payer: Self-pay | Admitting: Family Medicine

## 2021-10-02 ENCOUNTER — Other Ambulatory Visit: Payer: Self-pay

## 2021-10-02 ENCOUNTER — Ambulatory Visit (INDEPENDENT_AMBULATORY_CARE_PROVIDER_SITE_OTHER): Payer: Self-pay | Admitting: Family Medicine

## 2021-10-02 VITALS — BP 125/94 | HR 60 | Temp 97.6°F | Ht 66.0 in | Wt 192.4 lb

## 2021-10-02 DIAGNOSIS — E785 Hyperlipidemia, unspecified: Secondary | ICD-10-CM

## 2021-10-02 DIAGNOSIS — Z789 Other specified health status: Secondary | ICD-10-CM

## 2021-10-02 DIAGNOSIS — E119 Type 2 diabetes mellitus without complications: Secondary | ICD-10-CM

## 2021-10-02 LAB — POCT URINALYSIS DIP (CLINITEK)
Bilirubin, UA: NEGATIVE
Blood, UA: NEGATIVE
Glucose, UA: NEGATIVE mg/dL
Ketones, POC UA: NEGATIVE mg/dL
Leukocytes, UA: NEGATIVE
Nitrite, UA: NEGATIVE
POC PROTEIN,UA: NEGATIVE
Spec Grav, UA: >=1.03 — AB (ref 1.010–1.025)
Urobilinogen, UA: 0.2 E.U./dL
pH, UA: 5 (ref 5.0–8.0)

## 2021-10-02 LAB — GLUCOSE, POCT (MANUAL RESULT ENTRY): POC Glucose: 174 mg/dl — AB (ref 70–99)

## 2021-10-02 LAB — POCT GLYCOSYLATED HEMOGLOBIN (HGB A1C)
HbA1c POC (<> result, manual entry): 8.7 % (ref 4.0–5.6)
HbA1c, POC (controlled diabetic range): 8.7 % — AB (ref 0.0–7.0)
HbA1c, POC (prediabetic range): 8.7 % — AB (ref 5.7–6.4)
Hemoglobin A1C: 8.7 % — AB (ref 4.0–5.6)

## 2021-10-02 MED ORDER — METFORMIN HCL 500 MG PO TABS
500.0000 mg | ORAL_TABLET | Freq: Two times a day (BID) | ORAL | 2 refills | Status: DC
  Filled 2021-10-02: qty 180, 90d supply, fill #0
  Filled 2022-01-02: qty 180, 90d supply, fill #1

## 2021-10-02 MED ORDER — METFORMIN HCL 500 MG PO TABS
ORAL_TABLET | Freq: Every day | ORAL | 2 refills | Status: DC
  Filled 2021-10-02: qty 90, fill #0

## 2021-10-02 MED ORDER — ATORVASTATIN CALCIUM 40 MG PO TABS
40.0000 mg | ORAL_TABLET | Freq: Every day | ORAL | 3 refills | Status: DC
  Filled 2021-10-02: qty 90, 90d supply, fill #0
  Filled 2022-01-02: qty 90, 90d supply, fill #1

## 2021-10-02 MED ORDER — ASPIRIN 81 MG PO TBEC
81.0000 mg | DELAYED_RELEASE_TABLET | Freq: Every day | ORAL | 3 refills | Status: DC
  Filled 2021-10-02: qty 90, 90d supply, fill #0

## 2021-10-02 NOTE — Patient Instructions (Addendum)
Increase metformin to 500 mg twice daily Recommend a low impact exercise regimen 3 days per week for 30 minutes.  Recommend a low carbohydrate diet divided over small meals throughout the day.    Plan de accin para la diabetes mellitus Diabetes Mellitus Action Plan Seguir un plan de accin para la diabetes es una forma de controlar sus sntomas de diabetes (diabetes mellitus). El plan se codifica con colores para ayudarlo a comprender qu acciones necesita tomar en funcin de los sntomas que est teniendo. Si tiene sntomas pertenecientes a la zona roja, necesita buscar atencin mdica inmediatamente. Si tiene sntomas pertenecientes a la zona amarilla, est teniendo problemas. Si tiene sntomas pertenecientes a la zona verde, significa que se Brewing technologist. Aprender y comprender la diabetes puede Radiation protection practitioner. Siga el plan que elabor con el mdico. Conozca el rango deseado para su nivel de azcar en la sangre (glucosa), y revise su plan de tratamiento con su mdico en cada visita. El rango deseado para mi nivel de azcar en la sangre es __________________________ mg/dl. Zona roja Obtenga ayuda de inmediato si observa cualquiera de estos sntomas: Un resultado de azcar en la sangre que est por debajo de 54 mg/dl (3 mmol/l). Un nivel de azcar en la sangre mayor o igual que 240 mg/dl (13,3 mmol/l) durante 2 das seguidos. Confusin o dificultad para pensar con claridad. Dificultad para respirar. Malestar o fiebre durante 2 o ms [employer] no mejora. Niveles moderados o altos de cetonas en la [place]. Sentirse cansado o sin energa. Si tiene cualquiera de los sntomas pertenecientes a la zona roja, no espere para ver si desaparecen. Solicite atencin mdica de inmediato. Comunquese con el servicio de emergencias de su localidad (911 en los [place]). No conduzca por sus propios medios [employer]. Si tiene un nivel de azcar en la sangre muy bajo (hipoglucemia grave) y no puede  ingerir ningn alimento ni bebida, tal vez necesite glucagn. Asegrese de que un familiar o amigo sepa controlarle el nivel de azcar en la sangre y aplicarle glucagn. Puede necesitar tratamiento en un hospital para esta afeccin. Zona amarilla Si tiene alguno de los siguientes sntomas, su diabetes no est controlada y usted Product manager algunos cambios: Un nivel de azcar en la sangre mayor o igual que 240 mg/dl (13,3 mmol/l) durante 2 das seguidos. Un resultado de azcar en la sangre que est por debajo de 70 mg/dl (3,9 mmol/l). Otros sntomas de hipoglucemia, como: Temblores o sensacin de desvanecimiento. Confusin o irritabilidad. Sensacin de [place]. Latidos cardacos acelerados. Si tiene algn sntoma de la zona amarilla: Trate la hipoglucemia comiendo o bebiendo 15 gramos de hidratos de carbono de accin rpida. Siga las regla 15:15: Consuma 15 gramos de hidratos de carbono de accin rpida, como: 1 pomo de glucosa en gel. 4 comprimidos de glucosa. 4 onzas (120 ml) de jugo de frutas. 4 onzas (120 ml) de refresco comn (no diettico). Controle su nivel de azcar en la sangre 15 minutos despus de ingerir el hidrato de carbono. Si este nuevo nivel de azcar en la sangre todava es igual o menor que 70 mg/dl (3,9 mmol/l), ingiera nuevamente 15 gramos de un hidrato de carbono. Si el nivel de azcar en la sangre no supera los 70 mg/dl (3,9 mmol/l) despus de 3 intentos, solicite ayuda mdica de inmediato. Ingiera una comida o un refrigerio en el transcurso de 1 hora despus de que el nivel de azcar en la sangre se haya normalizado. Siga tomando los medicamentos diarios como  se lo haya indicado el mdico. Controle su nivel de azcar en la sangre con ms frecuencia que lo hara normalmente. [employer]. Llame al mdico si tiene problemas para Advertising account executive de azcar en la sangre dentro del rango deseado.  Zona verde Estos signos significan que se encuentra bien y  puede continuar haciendo lo que est haciendo para Chief Technology Officer la diabetes: Su nivel de azcar en la sangre est en el rango deseado. En la [employer], el nivel de Location manager en la sangre antes de una comida (preprandial) debera ser de 80 a 130 mg/dl (de 4.4 a 7.2 mmol/l). Se siente bien, y pueda volver a [employer] diarias. Si se encuentra en la zona verde, contine controlando su diabetes como se lo haya indicado el mdico. Para hacer esto: Siga una dieta saludable. Haga ejercicio regularmente. Controle su nivel de azcar en la sangre como se lo haya indicado el mdico. Tome los medicamentos como se lo haya indicado el mdico.  Dnde buscar ms informacin American Diabetes Association (ADA) (Asociacin Estadounidense de la Diabetes): diabetes.org Association of Diabetes Care & Education Specialists (ADCES) (Asociacin de Especialistas en Atencin y Educacin sobre la Diabetes): diabeteseducator.org Resumen Seguir un plan de accin para la diabetes, es una forma de controlar sus sntomas de diabetes. El plan se codifica con colores para ayudarlo a comprender qu acciones necesita tomar en funcin de los sntomas que est teniendo. Siga el plan que elabor con el mdico. Asegrese de conocer su nivel deseado de azcar en la sangre. Revise el plan de tratamiento con el mdico en todas las consultas. Esta informacin no tiene Marine scientist el consejo del mdico. Asegrese de hacerle al mdico cualquier pregunta que tenga. Document Revised: 12/09/2019 Document Reviewed: 12/09/2019 Elsevier Patient Education  [place].

## 2021-10-02 NOTE — Progress Notes (Signed)
[facility]  Established Patient Office Visit  Subjective   Patient ID: Drew [name], male    DOB: [birth year]  Age: 46 y.o. MRN: [id number]  Chief Complaint  Patient presents with  . Follow-up    Patient is here today for his 6 month follow up and to discuss medications he can take for his allergies. Patient also states that he was seen in the ED on 09/14/21 for head injury and patient states that he has been having headaches. No imaging was done during his ED visit.    Diabetes He presents for his follow-up diabetic visit. He has type 2 diabetes mellitus. His disease course has been stable. Pertinent negatives for diabetes include no blurred vision, no chest pain, no fatigue, no foot paresthesias, no foot ulcerations, no polydipsia, no polyphagia, no polyuria, no visual change, no weakness and no weight loss. Symptoms are stable. Risk factors for coronary artery disease include dyslipidemia, obesity and sedentary lifestyle. Current diabetic treatment includes diet. He is compliant with treatment most of the time. He does not see a podiatrist.Eye exam is not current.  Hyperlipidemia The problem is controlled. Recent lipid tests were reviewed and are normal. Exacerbating diseases include diabetes and obesity. Pertinent negatives include no chest pain. Current antihyperlipidemic treatment includes exercise.   Patient Active Problem List   Diagnosis Date Noted  . Left elbow pain 08/27/2021  . HLD (hyperlipidemia) 12/18/2017  . Diabetes mellitus without complication ([place]) 34/91/7915  . TIA (transient ischemic attack) 12/18/2017  . Right-sided muscle weakness 05/08/2017  . Right sided numbness 05/08/2017  . Headache 05/08/2017  . Stroke-like symptoms 05/08/2017  . Non-English speaking patient 05/08/2017  . Weakness 05/07/2017   Past Medical History:  Diagnosis Date  . Facial nerve palsy   . Stroke ([place])   . Type 2 diabetes mellitus ([place])     Past Surgical History:  Procedure Laterality Date  . APPENDECTOMY     Social History   Tobacco Use  . Smoking status: Never  . Smokeless tobacco: Never  . Tobacco comments:    Pt states he smokes once or twice a month  Vaping Use  . Vaping Use: Never used  Substance Use Topics  . Alcohol use: Yes    Comment: weekly  . Drug use: No   Social History   Socioeconomic History  . Marital status: Married    Spouse name: Not on file  . Number of children: Not on file  . Years of education: Not on file  . Highest education level: Not on file  Occupational History  . Not on file  Tobacco Use  . Smoking status: Never  . Smokeless tobacco: Never  . Tobacco comments:    Pt states he smokes once or twice a month  Vaping Use  . Vaping Use: Never used  Substance and Sexual Activity  . Alcohol use: Yes    Comment: weekly  . Drug use: No  . Sexual activity: Yes    Birth control/protection: None  Other Topics Concern  . Not on file  Social History Narrative  . Not on file   Social Determinants of Health   Financial Resource Strain: Not on file  Food Insecurity: Not on file  Transportation Needs: Not on file  Physical Activity: Not on file  Stress: Not on file  Social Connections: Not on file  Intimate Partner Violence: Not on file   Family Status  Relation Name Status  . Mother  Alive  . Father  Alive   Family History  Problem Relation Age of Onset  . Stroke Father    No Known Allergies    Review of Systems  Constitutional:  Negative for chills, fatigue, fever and weight loss.  HENT: Negative.    Eyes:  Negative for blurred vision.  Respiratory: Negative.    Cardiovascular: Negative.  Negative for chest pain.  Genitourinary: Negative.   Musculoskeletal: Negative.   Skin: Negative.   Neurological: Negative.  Negative for weakness.  Endo/Heme/Allergies:  Negative for polydipsia and polyphagia.  Psychiatric/Behavioral: Negative.       Objective:      BP (!) 125/94   Pulse 60   Temp 97.6 F (36.4 C)   Ht '5\' 6"'  (1.676 m)   Wt 192 lb 6.4 oz (87.3 kg)   SpO2 99%   BMI 31.05 kg/m  BP Readings from Last 3 Encounters:  10/02/21 (!) 125/94  09/14/21 (!) 132/94  08/27/21 120/80   Wt Readings from Last 3 Encounters:  10/02/21 192 lb 6.4 oz (87.3 kg)  08/27/21 190 lb (86.2 kg)  08/23/21 190 lb (86.2 kg)      Physical Exam Constitutional:      Appearance: Normal appearance. He is obese.  HENT:     Nose: Nose normal.  Cardiovascular:     Rate and Rhythm: Normal rate and regular rhythm.     Pulses: Normal pulses.  Pulmonary:     Effort: Pulmonary effort is normal.  Abdominal:     General: Bowel sounds are normal.     Palpations: Abdomen is soft.  Skin:    General: Skin is warm.  Neurological:     General: No focal deficit present.     Mental Status: He is alert. Mental status is at baseline.  Psychiatric:        Mood and Affect: Mood normal.        Behavior: Behavior normal.        Thought Content: Thought content normal.        Judgment: Judgment normal.     Results for orders placed or performed in visit on 10/02/21  HgB A1c  Result Value Ref Range   Hemoglobin A1C 8.7 (A) 4.0 - 5.6 %   HbA1c POC (<> result, manual entry) 8.7 4.0 - 5.6 %   HbA1c, POC (prediabetic range) 8.7 (A) 5.7 - 6.4 %   HbA1c, POC (controlled diabetic range) 8.7 (A) 0.0 - 7.0 %    Last CBC Lab Results  Component Value Date   WBC 6.3 06/29/2019   HGB 15.0 06/29/2019   HCT 42.7 06/29/2019   MCV 92 06/29/2019   MCH 32.4 06/29/2019   RDW 13.1 06/29/2019   PLT 209 78/58/8502   Last metabolic panel Lab Results  Component Value Date   GLUCOSE 143 (H) 04/03/2021   NA 141 04/03/2021   K 4.9 04/03/2021   CL 105 04/03/2021   CO2 23 04/03/2021   BUN 11 04/03/2021   CREATININE 0.76 04/03/2021   EGFR 114 04/03/2021   CALCIUM 9.1 04/03/2021   PROT 6.8 04/03/2021   ALBUMIN 4.6 04/03/2021   LABGLOB 2.1 03/28/2020   AGRATIO 2.2  03/28/2020   BILITOT 0.4 04/03/2021   ALKPHOS 106 04/03/2021   AST 22 04/03/2021   ALT 23 04/03/2021   ANIONGAP 11 12/17/2017   Last lipids Lab Results  Component Value Date   CHOL 150 09/26/2020   HDL 51 09/26/2020   LDLCALC 83 09/26/2020  TRIG 85 09/26/2020   CHOLHDL 2.9 09/26/2020   Last hemoglobin A1c Lab Results  Component Value Date   HGBA1C 8.7 (A) 10/02/2021   HGBA1C 8.7 10/02/2021   HGBA1C 8.7 (A) 10/02/2021   HGBA1C 8.7 (A) 10/02/2021   Last thyroid functions No results found for: TSH, T3TOTAL, T4TOTAL, THYROIDAB Last vitamin D No results found for: 25OHVITD2, 25OHVITD3, VD25OH Last vitamin B12 and Folate No results found for: VITAMINB12, FOLATE    The 10-year ASCVD risk score (Arnett DK, et al., 2019) is: 2.2%    Assessment & Plan:   Problem List Items Addressed This Visit       Endocrine   Diabetes mellitus without complication ([place])   Relevant Medications   aspirin EC 81 MG tablet   metFORMIN (GLUCOPHAGE) 500 MG tablet   atorvastatin (LIPITOR) 40 MG tablet   Other Visit Diagnoses     Type 2 diabetes mellitus without complication, without long-term current use of insulin (HCC)    -  Primary   Relevant Medications   aspirin EC 81 MG tablet   metFORMIN (GLUCOPHAGE) 500 MG tablet   atorvastatin (LIPITOR) 40 MG tablet   Other Relevant Orders   HgB A1c (Completed)   Comprehensive metabolic panel   Hyperlipidemia LDL goal <100       Relevant Medications   aspirin EC 81 MG tablet   atorvastatin (LIPITOR) 40 MG tablet   Other Relevant Orders   Lipid Panel       Return in about 6 months (around 04/04/2022) for diabetes, hyperlipidemia.   [name]  APRN, MSN, FNP-C [facility] [address] [place], [place] [address] [phone]

## 2021-10-03 ENCOUNTER — Other Ambulatory Visit: Payer: Self-pay

## 2021-10-03 LAB — COMPREHENSIVE METABOLIC PANEL
ALT: 28 IU/L (ref 0–44)
AST: 20 IU/L (ref 0–40)
Albumin/Globulin Ratio: 1.8 (ref 1.2–2.2)
Albumin: 4.4 g/dL (ref 4.0–5.0)
Alkaline Phosphatase: 96 IU/L (ref 44–121)
BUN/Creatinine Ratio: 12 (ref 9–20)
BUN: 10 mg/dL (ref 6–24)
Bilirubin Total: 0.4 mg/dL (ref 0.0–1.2)
CO2: 23 mmol/L (ref 20–29)
Calcium: 8.9 mg/dL (ref 8.7–10.2)
Chloride: 105 mmol/L (ref 96–106)
Creatinine, Ser: 0.82 mg/dL (ref 0.76–1.27)
Globulin, Total: 2.4 g/dL (ref 1.5–4.5)
Glucose: 193 mg/dL — ABNORMAL HIGH (ref 70–99)
Potassium: 4.5 mmol/L (ref 3.5–5.2)
Sodium: 141 mmol/L (ref 134–144)
Total Protein: 6.8 g/dL (ref 6.0–8.5)
eGFR: 110 mL/min/{1.73_m2} (ref >59)

## 2021-10-03 LAB — LIPID PANEL
Chol/HDL Ratio: 3 ratio (ref 0.0–5.0)
Cholesterol, Total: 139 mg/dL (ref 100–199)
HDL: 46 mg/dL (ref >39)
LDL Chol Calc (NIH): 72 mg/dL (ref 0–99)
Triglycerides: 115 mg/dL (ref 0–149)
VLDL Cholesterol Cal: 21 mg/dL (ref 5–40)

## 2021-12-31 ENCOUNTER — Encounter: Payer: Self-pay | Admitting: Family Medicine

## 2021-12-31 ENCOUNTER — Other Ambulatory Visit: Payer: Self-pay | Admitting: Nurse Practitioner

## 2021-12-31 ENCOUNTER — Other Ambulatory Visit: Payer: Self-pay

## 2021-12-31 DIAGNOSIS — Z0000 Encounter for general adult medical examination without abnormal findings: Secondary | ICD-10-CM

## 2022-01-01 LAB — COMPREHENSIVE METABOLIC PANEL
ALT: 21 IU/L (ref 0–44)
AST: 22 IU/L (ref 0–40)
Albumin/Globulin Ratio: 1.7 (ref 1.2–2.2)
Albumin: 4.3 g/dL (ref 4.1–5.1)
Alkaline Phosphatase: 85 IU/L (ref 44–121)
BUN/Creatinine Ratio: 27 — ABNORMAL HIGH (ref 9–20)
BUN: 21 mg/dL (ref 6–24)
Bilirubin Total: 0.4 mg/dL (ref 0.0–1.2)
CO2: 20 mmol/L (ref 20–29)
Calcium: 9.2 mg/dL (ref 8.7–10.2)
Chloride: 105 mmol/L (ref 96–106)
Creatinine, Ser: 0.77 mg/dL (ref 0.76–1.27)
Globulin, Total: 2.5 g/dL (ref 1.5–4.5)
Glucose: 150 mg/dL — ABNORMAL HIGH (ref 70–99)
Potassium: 4.8 mmol/L (ref 3.5–5.2)
Sodium: 139 mmol/L (ref 134–144)
Total Protein: 6.8 g/dL (ref 6.0–8.5)
eGFR: 113 mL/min/{1.73_m2} (ref >59)

## 2022-01-02 ENCOUNTER — Other Ambulatory Visit: Payer: Self-pay

## 2022-04-03 ENCOUNTER — Other Ambulatory Visit: Payer: Self-pay

## 2022-04-03 ENCOUNTER — Ambulatory Visit: Payer: Self-pay | Admitting: Nurse Practitioner

## 2022-04-03 ENCOUNTER — Telehealth: Payer: Self-pay | Admitting: Family Medicine

## 2022-04-03 DIAGNOSIS — E119 Type 2 diabetes mellitus without complications: Secondary | ICD-10-CM

## 2022-04-03 DIAGNOSIS — E785 Hyperlipidemia, unspecified: Secondary | ICD-10-CM

## 2022-04-03 MED ORDER — ATORVASTATIN CALCIUM 40 MG PO TABS
40.0000 mg | ORAL_TABLET | Freq: Every day | ORAL | 1 refills | Status: DC
  Filled 2022-04-03: qty 30, 30d supply, fill #0

## 2022-04-03 MED ORDER — METFORMIN HCL 500 MG PO TABS
500.0000 mg | ORAL_TABLET | Freq: Two times a day (BID) | ORAL | 1 refills | Status: DC
  Filled 2022-04-03: qty 60, 30d supply, fill #0

## 2022-04-03 NOTE — Telephone Encounter (Signed)
Caller & Relationship to patient:  MRN #  [id number]   Call Back Number:   Date of Last Office Visit: 12/31/2021     Date of Next Office Visit: 04/25/2022    Medication(s) to be Refilled: Cholestrol med Diabetics med  Preferred Pharmacy:   ** Please notify patient to allow 48-72 hours to process** **Let patient know to contact pharmacy at the end of the day to make sure medication is ready. ** **If patient has not been seen in a year or longer, book an appointment **Advise to use MyChart for refill requests OR to contact their pharmacy

## 2022-04-03 NOTE — Telephone Encounter (Signed)
Done

## 2022-04-05 ENCOUNTER — Other Ambulatory Visit: Payer: Self-pay

## 2022-04-25 ENCOUNTER — Encounter: Payer: Self-pay | Admitting: Nurse Practitioner

## 2022-04-25 ENCOUNTER — Other Ambulatory Visit: Payer: Self-pay

## 2022-04-25 ENCOUNTER — Ambulatory Visit (INDEPENDENT_AMBULATORY_CARE_PROVIDER_SITE_OTHER): Payer: Self-pay | Admitting: Nurse Practitioner

## 2022-04-25 VITALS — BP 125/93 | HR 88 | Temp 98.0°F | Ht 66.0 in | Wt 185.0 lb

## 2022-04-25 DIAGNOSIS — E119 Type 2 diabetes mellitus without complications: Secondary | ICD-10-CM

## 2022-04-25 DIAGNOSIS — Z1211 Encounter for screening for malignant neoplasm of colon: Secondary | ICD-10-CM

## 2022-04-25 DIAGNOSIS — E785 Hyperlipidemia, unspecified: Secondary | ICD-10-CM

## 2022-04-25 DIAGNOSIS — Z23 Encounter for immunization: Secondary | ICD-10-CM

## 2022-04-25 LAB — POCT GLYCOSYLATED HEMOGLOBIN (HGB A1C): Hemoglobin A1C: 6.6 % — AB (ref 4.0–5.6)

## 2022-04-25 MED ORDER — ATORVASTATIN CALCIUM 40 MG PO TABS
40.0000 mg | ORAL_TABLET | Freq: Every day | ORAL | 2 refills | Status: DC
  Filled 2022-04-25 – 2022-05-03 (×2): qty 30, 30d supply, fill #0
  Filled 2022-06-06: qty 30, 30d supply, fill #1
  Filled 2022-07-08: qty 30, 30d supply, fill #2

## 2022-04-25 MED ORDER — METFORMIN HCL 500 MG PO TABS
500.0000 mg | ORAL_TABLET | Freq: Two times a day (BID) | ORAL | 2 refills | Status: DC
  Filled 2022-04-25 – 2022-05-03 (×2): qty 60, 30d supply, fill #0
  Filled 2022-06-06: qty 60, 30d supply, fill #1
  Filled 2022-07-08: qty 60, 30d supply, fill #2

## 2022-04-25 MED ORDER — ASPIRIN 81 MG PO TBEC
81.0000 mg | DELAYED_RELEASE_TABLET | Freq: Every day | ORAL | 2 refills | Status: AC
  Filled 2022-04-25: qty 100, 100d supply, fill #0

## 2022-04-25 NOTE — Progress Notes (Signed)
$'@Patient'w$  ID: Drew [name], male    DOB: Dec 31, 1975, 46 y.o.   MRN: [id number]  Chief Complaint  Patient presents with   Follow-up    Referring provider: Dorena Dew, FNP   HPI  Diabetes He presents for his follow-up diabetic visit. He has type 2 diabetes mellitus. His disease course has been stable. Pertinent negatives for diabetes include no blurred vision, no chest pain, no fatigue, no foot paresthesias, no foot ulcerations, no polydipsia, no polyphagia, no polyuria, no visual change, no weakness and no weight loss. Symptoms are stable. Risk factors for coronary artery disease include dyslipidemia, obesity and sedentary lifestyle. Current diabetic treatment includes diet. He is compliant with treatment most of the time.   Hyperlipidemia The problem is controlled. Recent lipid tests were reviewed and are normal. Exacerbating diseases include diabetes and obesity. Pertinent negatives include no chest pain. Current antihyperlipidemic treatment includes exercise.         No Known Allergies  Immunization History  Administered Date(s) Administered   Influenza,inj,Quad PF,6+ Mos 06/06/2017, 03/06/2018, 03/28/2020, 04/03/2021, 04/25/2022   PFIZER(Purple Top)SARS-COV-2 Vaccination 07/29/2019, 08/19/2019   Pneumococcal Conjugate-13 06/29/2019   Pneumococcal Polysaccharide-23 06/06/2017   Tdap 02/20/2011, 06/06/2017    Past Medical History:  Diagnosis Date   Facial nerve palsy    Stroke ([place])    Type 2 diabetes mellitus ([place])     Tobacco History: Social History   Tobacco Use  Smoking Status Never  Smokeless Tobacco Never  Tobacco Comments   Pt states he smokes once or twice a month   Counseling given: Not Answered Tobacco comments: Pt states he smokes once or twice a month   Outpatient Encounter Medications as of 04/25/2022  Medication Sig   [DISCONTINUED] aspirin EC 81 MG tablet Take 1 tablet (81 mg total) by mouth daily.   [DISCONTINUED] atorvastatin (LIPITOR)  40 MG tablet Take 1 tablet (40 mg total) by mouth daily.   [DISCONTINUED] metFORMIN (GLUCOPHAGE) 500 MG tablet Take 1 tablet (500 mg total) by mouth 2 (two) times daily with a meal.   aspirin EC 81 MG tablet Take 1 tablet (81 mg total) by mouth daily.   atorvastatin (LIPITOR) 40 MG tablet Take 1 tablet (40 mg total) by mouth daily.   metFORMIN (GLUCOPHAGE) 500 MG tablet Take 1 tablet (500 mg total) by mouth 2 (two) times daily with a meal.   naproxen (NAPROSYN) 500 MG tablet Take 1 tablet (500 mg total) by mouth 2 (two) times daily with a meal. (Patient not taking: Reported on 04/25/2022)   No facility-administered encounter medications on file as of 04/25/2022.     Review of Systems  Review of Systems  Constitutional: Negative.   HENT: Negative.    Cardiovascular: Negative.   Gastrointestinal: Negative.   Allergic/Immunologic: Negative.   Neurological: Negative.   Psychiatric/Behavioral: Negative.         Physical Exam  BP (!) 125/93   Pulse 88   Temp 98 F (36.7 C)   Ht '5\' 6"'$  (1.676 m)   Wt 185 lb (83.9 kg)   SpO2 99%   BMI 29.86 kg/m   Wt Readings from Last 5 Encounters:  04/25/22 185 lb (83.9 kg)  10/02/21 192 lb 6.4 oz (87.3 kg)  08/27/21 190 lb (86.2 kg)  08/23/21 190 lb (86.2 kg)  05/15/21 191 lb 3.2 oz (86.7 kg)     Physical Exam Vitals and nursing note reviewed.  Constitutional:      General: He is not in acute distress.  Appearance: He is well-developed.  Cardiovascular:     Rate and Rhythm: Normal rate and regular rhythm.  Pulmonary:     Effort: Pulmonary effort is normal.     Breath sounds: Normal breath sounds.  Skin:    General: Skin is warm and dry.  Neurological:     Mental Status: He is alert and oriented to person, place, and time.       Assessment & Plan:   Diabetes mellitus without complication (HCC) - CBC - Comprehensive metabolic panel   2. Hyperlipidemia LDL goal <100  - atorvastatin (LIPITOR) 40 MG tablet; Take 1  tablet (40 mg total) by mouth daily.  Dispense: 30 tablet; Refill: 2 - aspirin EC 81 MG tablet; Take 1 tablet (81 mg total) by mouth daily.  Dispense: 30 tablet; Refill: 2   3. Diabetes mellitus without complication (HCC)  - metFORMIN (GLUCOPHAGE) 500 MG tablet; Take 1 tablet (500 mg total) by mouth 2 (two) times daily with a meal.  Dispense: 60 tablet; Refill: 2 - aspirin EC 81 MG tablet; Take 1 tablet (81 mg total) by mouth daily.  Dispense: 30 tablet; Refill: 2  Follow up:  Follow up 3 months     Fenton Foy, NP 04/25/2022

## 2022-04-25 NOTE — Patient Instructions (Addendum)
1. Type 2 diabetes mellitus without complication, without long-term current use of insulin (HCC)  - CBC - Comprehensive metabolic panel   2. Hyperlipidemia LDL goal <100  - atorvastatin (LIPITOR) 40 MG tablet; Take 1 tablet (40 mg total) by mouth daily.  Dispense: 30 tablet; Refill: 2 - aspirin EC 81 MG tablet; Take 1 tablet (81 mg total) by mouth daily.  Dispense: 30 tablet; Refill: 2   3. Diabetes mellitus without complication (HCC)  - metFORMIN (GLUCOPHAGE) 500 MG tablet; Take 1 tablet (500 mg total) by mouth 2 (two) times daily with a meal.  Dispense: 60 tablet; Refill: 2 - aspirin EC 81 MG tablet; Take 1 tablet (81 mg total) by mouth daily.  Dispense: 30 tablet; Refill: 2  Follow up:  Follow up 3 months

## 2022-04-25 NOTE — Assessment & Plan Note (Signed)
-   CBC - Comprehensive metabolic panel   2. Hyperlipidemia LDL goal <100  - atorvastatin (LIPITOR) 40 MG tablet; Take 1 tablet (40 mg total) by mouth daily.  Dispense: 30 tablet; Refill: 2 - aspirin EC 81 MG tablet; Take 1 tablet (81 mg total) by mouth daily.  Dispense: 30 tablet; Refill: 2   3. Diabetes mellitus without complication (HCC)  - metFORMIN (GLUCOPHAGE) 500 MG tablet; Take 1 tablet (500 mg total) by mouth 2 (two) times daily with a meal.  Dispense: 60 tablet; Refill: 2 - aspirin EC 81 MG tablet; Take 1 tablet (81 mg total) by mouth daily.  Dispense: 30 tablet; Refill: 2  Follow up:  Follow up 3 months

## 2022-04-26 ENCOUNTER — Other Ambulatory Visit: Payer: Self-pay

## 2022-04-26 LAB — COMPREHENSIVE METABOLIC PANEL
ALT: 28 IU/L (ref 0–44)
AST: 26 IU/L (ref 0–40)
Albumin/Globulin Ratio: 1.9 (ref 1.2–2.2)
Albumin: 4.3 g/dL (ref 4.1–5.1)
Alkaline Phosphatase: 78 IU/L (ref 44–121)
BUN/Creatinine Ratio: 13 (ref 9–20)
BUN: 11 mg/dL (ref 6–24)
Bilirubin Total: 0.3 mg/dL (ref 0.0–1.2)
CO2: 21 mmol/L (ref 20–29)
Calcium: 8.7 mg/dL (ref 8.7–10.2)
Chloride: 106 mmol/L (ref 96–106)
Creatinine, Ser: 0.82 mg/dL (ref 0.76–1.27)
Globulin, Total: 2.3 g/dL (ref 1.5–4.5)
Glucose: 125 mg/dL — ABNORMAL HIGH (ref 70–99)
Potassium: 4.3 mmol/L (ref 3.5–5.2)
Sodium: 141 mmol/L (ref 134–144)
Total Protein: 6.6 g/dL (ref 6.0–8.5)
eGFR: 110 mL/min/{1.73_m2} (ref >59)

## 2022-04-26 LAB — CBC
Hematocrit: 41.9 % (ref 37.5–51.0)
Hemoglobin: 14.3 g/dL (ref 13.0–17.7)
MCH: 32.6 pg (ref 26.6–33.0)
MCHC: 34.1 g/dL (ref 31.5–35.7)
MCV: 95 fL (ref 79–97)
Platelets: 211 10*3/uL (ref 150–450)
RBC: 4.39 x10E6/uL (ref 4.14–5.80)
RDW: 13 % (ref 11.6–15.4)
WBC: 4.4 10*3/uL (ref 3.4–10.8)

## 2022-05-03 ENCOUNTER — Other Ambulatory Visit: Payer: Self-pay

## 2022-05-04 ENCOUNTER — Emergency Department (HOSPITAL_BASED_OUTPATIENT_CLINIC_OR_DEPARTMENT_OTHER): Payer: Self-pay

## 2022-05-04 ENCOUNTER — Emergency Department (HOSPITAL_BASED_OUTPATIENT_CLINIC_OR_DEPARTMENT_OTHER)
Admission: EM | Admit: 2022-05-04 | Discharge: 2022-05-04 | Disposition: A | Discharge status: Home / Self Care | Payer: Self-pay | Attending: Emergency Medicine | Admitting: Emergency Medicine

## 2022-05-04 ENCOUNTER — Other Ambulatory Visit: Payer: Self-pay

## 2022-05-04 DIAGNOSIS — S62339A Displaced fracture of neck of unspecified metacarpal bone, initial encounter for closed fracture: Secondary | ICD-10-CM

## 2022-05-04 DIAGNOSIS — E119 Type 2 diabetes mellitus without complications: Secondary | ICD-10-CM | POA: Insufficient documentation

## 2022-05-04 DIAGNOSIS — Z7984 Long term (current) use of oral hypoglycemic drugs: Secondary | ICD-10-CM | POA: Insufficient documentation

## 2022-05-04 DIAGNOSIS — Z7982 Long term (current) use of aspirin: Secondary | ICD-10-CM | POA: Insufficient documentation

## 2022-05-04 DIAGNOSIS — W19XXXA Unspecified fall, initial encounter: Secondary | ICD-10-CM | POA: Insufficient documentation

## 2022-05-04 DIAGNOSIS — M79641 Pain in right hand: Secondary | ICD-10-CM | POA: Insufficient documentation

## 2022-05-04 DIAGNOSIS — S62314A Displaced fracture of base of fourth metacarpal bone, right hand, initial encounter for closed fracture: Secondary | ICD-10-CM | POA: Insufficient documentation

## 2022-05-04 DIAGNOSIS — S62316A Displaced fracture of base of fifth metacarpal bone, right hand, initial encounter for closed fracture: Secondary | ICD-10-CM | POA: Insufficient documentation

## 2022-05-04 NOTE — Discharge Instructions (Addendum)
Keep the splint in place and dry.  Make an appointment to follow-up with hand surgery for the boxer fractures of your fourth and fifth metacarpals.  Motrin 800 mg every 8 hours as needed pain.

## 2022-05-04 NOTE — ED Provider Notes (Signed)
[facility] EMERGENCY DEPARTMENT Provider Note   CSN: [id number] Arrival date & time: 05/04/22  1004     History  Chief Complaint  Patient presents with   Hand Pain    Drew [name] is a 46 y.o. male.  Patient with injury to right hand right-hand-dominant states he fell into a wall last evening.  Denies any other injuries.  Past medical history significant for diabetes.  Patient is Spanish-speaking.  With family members able to interpret without any difficulty.       Home Medications Prior to Admission medications   Medication Sig Start Date End Date Taking? Authorizing Provider  aspirin EC 81 MG tablet Take 1 tablet (81 mg total) by mouth daily. 04/25/22 08/04/22  Fenton Foy, NP  atorvastatin (LIPITOR) 40 MG tablet Take 1 tablet (40 mg total) by mouth daily. 04/25/22   Fenton Foy, NP  metFORMIN (GLUCOPHAGE) 500 MG tablet Take 1 tablet (500 mg total) by mouth 2 (two) times daily with a meal. 04/25/22   Fenton Foy, NP  naproxen (NAPROSYN) 500 MG tablet Take 1 tablet (500 mg total) by mouth 2 (two) times daily with a meal. Patient not taking: Reported on 04/25/2022 05/15/21   Dorena Dew, FNP      Allergies    Patient has no known allergies.    Review of Systems   Review of Systems  Constitutional:  Negative for chills and fever.  HENT:  Negative for ear pain and sore throat.   Eyes:  Negative for pain and visual disturbance.  Respiratory:  Negative for cough and shortness of breath.   Cardiovascular:  Negative for chest pain and palpitations.  Gastrointestinal:  Negative for abdominal pain and vomiting.  Genitourinary:  Negative for dysuria and hematuria.  Musculoskeletal:  Positive for joint swelling. Negative for arthralgias and back pain.  Skin:  Negative for color change and rash.  Neurological:  Negative for seizures and syncope.  All other systems reviewed and are negative.   Physical Exam Updated Vital Signs BP (!) 149/97    Pulse 68   Temp 98 F (36.7 C) (Oral)   Resp 17   SpO2 99%  Physical Exam Vitals and nursing note reviewed.  Constitutional:      General: He is not in acute distress.    Appearance: He is well-developed.  HENT:     Head: Normocephalic and atraumatic.  Eyes:     Conjunctiva/sclera: Conjunctivae normal.  Cardiovascular:     Rate and Rhythm: Normal rate and regular rhythm.     Heart sounds: No murmur heard. Pulmonary:     Effort: Pulmonary effort is normal. No respiratory distress.     Breath sounds: Normal breath sounds.  Abdominal:     Palpations: Abdomen is soft.     Tenderness: There is no abdominal tenderness.  Musculoskeletal:        General: Swelling, tenderness and deformity present.     Cervical back: Neck supple.     Comments: Right hand with marked swelling over the fourth and fifth metacarpal area.  With bruising.  No open wounds.  Neurovascularly intact distally good movement of the fingers.  No tenderness at the wrist or snuffbox area.  Good movement at the elbow forearm without evidence of any injury.  Good movement of the shoulder.  Radial pulses 2+.  Skin:    General: Skin is warm and dry.     Capillary Refill: Capillary refill takes less than 2 seconds.  Neurological:  Mental Status: He is alert.  Psychiatric:        Mood and Affect: Mood normal.     ED Results / Procedures / Treatments   Labs (all labs ordered are listed, but only abnormal results are displayed) Labs Reviewed - No data to display  EKG None  Radiology DG Hand Complete Right  Result Date: 05/04/2022 CLINICAL DATA:  Fall.  Right hand pain. EXAM: RIGHT HAND - COMPLETE 3+ VIEW COMPARISON:  None Available. FINDINGS: Fractures of the fifth metacarpal. There is an oblique fracture of the distal shaft, minimally comminuted, nondisplaced with mild, proximally 15 degrees, of anterior angulation. There is also a fracture of the base of the fifth metacarpal, displaced dorsally by 2-3 mm. Non  angulated. There is a subtle fracture, nondisplaced and non comminuted, at the base of the fourth metacarpal. No other fractures.  Joints are normally aligned. Dorsal soft tissue swelling. IMPRESSION: 1. Fractures of the fifth and fourth right hand metacarpals as detailed. No dislocation. Electronically Signed   By: Lajean Manes M.D.   On: 05/04/2022 10:32    Procedures Procedures    Medications Ordered in ED Medications - No data to display  ED Course/ Medical Decision Making/ A&P                           Medical Decision Making Amount and/or Complexity of Data Reviewed Radiology: ordered.   X-ray consistent with fractures of the fifth and fourth right hand metacarpals no dislocation.  Foot will splint to help with volar forearm splint in extension to the MCP joint area.  Kind of a modified ulnar gutter.  Patient given hand surgery number to call and set up an appointment for further treatment.  No other injuries. Final Clinical Impression(s) / ED Diagnoses Final diagnoses:  Closed boxer's fracture, initial encounter    Rx / DC Orders ED Discharge Orders     None         Fredia Sorrow, MD 05/04/22 1211

## 2022-05-04 NOTE — ED Triage Notes (Signed)
Patient presents to ED via POV from home. Here after injuring his right hand. [place]. Denies head strike. Not on blood thinners. No LOC.

## 2022-05-04 NOTE — ED Notes (Signed)
Large knot noted to the R side of Pt. R Hand and Pt. Family reports the pt. Took 6-8  naproxen for pain.  Pt. In no distress at present time.   RN spoke with family due to Pt. Not speaking english.  Asked pt. About depression and if he was trying to harm self and the answer was "NO" from the Patient.  Pt. Drew [name] thru the family speaking for the Patient that he fell into a wall catching himself with his R hand.

## 2022-06-06 ENCOUNTER — Other Ambulatory Visit: Payer: Self-pay

## 2022-06-10 ENCOUNTER — Telehealth: Payer: Self-pay

## 2022-06-12 ENCOUNTER — Telehealth: Payer: Self-pay

## 2022-06-12 NOTE — Telephone Encounter (Signed)
Error

## 2022-06-12 NOTE — Telephone Encounter (Signed)
Transition Care Management Unsuccessful Follow-up Telephone Call  Date of discharge and from where:  06/08/22  Attempts:  1st Attempt  Reason for unsuccessful TCM follow-up call:  Left voice message  Elyse Jarvis RMA

## 2022-07-08 ENCOUNTER — Other Ambulatory Visit: Payer: Self-pay

## 2022-07-25 ENCOUNTER — Other Ambulatory Visit: Payer: Self-pay

## 2022-07-25 ENCOUNTER — Encounter: Payer: Self-pay | Admitting: Nurse Practitioner

## 2022-07-25 ENCOUNTER — Ambulatory Visit (INDEPENDENT_AMBULATORY_CARE_PROVIDER_SITE_OTHER): Payer: Self-pay | Admitting: Nurse Practitioner

## 2022-07-25 VITALS — BP 126/84 | HR 67 | Temp 97.4°F | Wt 188.4 lb

## 2022-07-25 DIAGNOSIS — E119 Type 2 diabetes mellitus without complications: Secondary | ICD-10-CM

## 2022-07-25 DIAGNOSIS — G4700 Insomnia, unspecified: Secondary | ICD-10-CM

## 2022-07-25 DIAGNOSIS — Z1329 Encounter for screening for other suspected endocrine disorder: Secondary | ICD-10-CM

## 2022-07-25 DIAGNOSIS — F32A Depression, unspecified: Secondary | ICD-10-CM

## 2022-07-25 DIAGNOSIS — Z1211 Encounter for screening for malignant neoplasm of colon: Secondary | ICD-10-CM

## 2022-07-25 LAB — POCT GLYCOSYLATED HEMOGLOBIN (HGB A1C): Hemoglobin A1C: 6.8 % — AB (ref 4.0–5.6)

## 2022-07-25 MED ORDER — HYDROXYZINE HCL 10 MG PO TABS
10.0000 mg | ORAL_TABLET | Freq: Three times a day (TID) | ORAL | 0 refills | Status: DC | PRN
  Filled 2022-07-25: qty 30, 10d supply, fill #0

## 2022-07-25 NOTE — Assessment & Plan Note (Signed)
-   Microalbumin/Creatinine Ratio, Urine - POCT glycosylated hemoglobin (Hb A1C) - CBC - Comprehensive metabolic panel  2. Colon cancer screening  - Cologuard  3. Depression, unspecified depression type   Patient states that he is suicidal - will consult with Manuela Schwartz while in office for suicide protocol  Patient will need psychiatrist for medication management  Follow up:  Follow up in 1 month depression

## 2022-07-25 NOTE — Progress Notes (Signed)
Subjective:    Patient ID: Drew [name], male    DOB: 03/29/1976, 47 y.o.   MRN: [id number]  Drew [name] is a 47 y.o. male who presents for follow-up of Type 2 diabetes mellitus.  Patient is checking home blood sugars.   Home blood sugar records: BGs have been labile ranging between 120 and 150 How often is blood sugars being checked: daily Current symptoms/problems include none and have been stable.  A1C in office today was 6.8  Depression - reports suicidal thoughts in office today- will consult Manuela Schwartz for counseling / suicide protocol   The following portions of the patient's history were reviewed and updated as appropriate: allergies, current medications, past medical history, past social history and problem list.   Review of Systems  Constitutional: Negative.   HENT: Negative.    Eyes: Negative.   Respiratory: Negative.    Cardiovascular: Negative.   Gastrointestinal: Negative.   Genitourinary: Negative.   Musculoskeletal: Negative.   Skin: Negative.   Neurological: Negative.   Endo/Heme/Allergies: Negative.   Psychiatric/Behavioral: Negative.          Objective:    Physical Exam Constitutional:      General: He is not in acute distress. Cardiovascular:     Rate and Rhythm: Normal rate and regular rhythm.  Pulmonary:     Effort: Pulmonary effort is normal.     Breath sounds: Normal breath sounds.  Skin:    General: Skin is warm and dry.  Neurological:     Mental Status: He is alert and oriented to person, place, and time.  Psychiatric:        Mood and Affect: Affect normal.      Blood pressure 126/84, pulse 67, temperature (!) 97.4 F (36.3 C), weight 188 lb 6.4 oz (85.5 kg), SpO2 100 %.  Lab Review    Latest Ref Rng & Units 07/25/2022    9:09 AM 04/25/2022   10:37 AM 04/25/2022   10:31 AM 12/31/2021    9:29 AM 10/02/2021   11:08 AM  Diabetic Labs  HbA1c 4.0 - 5.6 % 6.8   6.6     Chol 100 - 199 mg/dL     139   HDL >39 mg/dL     46   Calc LDL 0 - 99  mg/dL     72   Triglycerides 0 - 149 mg/dL     115   Creatinine 0.76 - 1.27 mg/dL  0.82   0.77  0.82       07/25/2022    8:26 AM 05/04/2022    1:22 PM 05/04/2022   10:13 AM 04/25/2022   10:09 AM 10/02/2021    8:58 AM  BP/Weight  Systolic BP [id number] 0000000 0000000  Diastolic BP 84 95 97 93 94  Wt. (Lbs) 188.4   185 192.4  BMI 30.41 kg/m2   29.86 kg/m2 31.05 kg/m2      09/26/2020    8:40 AM 03/28/2020    8:40 AM  Foot/eye exam completion dates  Foot Form Completion Done Done    Tayquan  reports that he has never smoked. He has never used smokeless tobacco. He reports current alcohol use. He reports that he does not use drugs.     Assessment & Plan:    Depression, unspecified depression type  Diabetes mellitus without complication ([facility]) - Plan: Microalbumin/Creatinine Ratio, Urine, POCT glycosylated hemoglobin (Hb A1C), CBC, Comprehensive metabolic panel  Colon cancer screening - Plan: Cologuard  Thyroid disorder screen - Plan:  TSH  Insomnia, unspecified type - Plan: hydrOXYzine (ATARAX) 10 MG tablet  Rx changes: none Education: Reviewed 'ABCs' of diabetes management (respective goals in parentheses):  A1C (<7), blood pressure (<130/80), and cholesterol (LDL <100). Compliance at present is estimated to be excellent. Efforts to improve compliance (if necessary) will be directed at increased exercise. Follow up: 3 months  Azucena Kuba, FNP-C  07/25/22

## 2022-07-25 NOTE — Patient Instructions (Addendum)
1. Diabetes mellitus without complication (HCC)  - Microalbumin/Creatinine Ratio, Urine - POCT glycosylated hemoglobin (Hb A1C) - CBC - Comprehensive metabolic panel  2. Colon cancer screening  - Cologuard  3. Depression, unspecified depression type   Patient states that he is suicidal - will consult with Manuela Schwartz while in office for suicide protocol  Patient will need psychiatrist for medication management  Follow up:  Follow up in 1 month depression

## 2022-07-25 NOTE — Progress Notes (Signed)
Integrated Behavioral Health Referral Note  07/25/2022 Name: Drew [name] MRN: [id number] DOB: 12/14/1975 Drew [name] is a 47 y.o. year old male who sees Fenton Foy, NP for primary care. LCSW was consulted to assess patient's needs and assist the patient with Mental Health Counseling and Resources.  Interpreter: No.   Interpreter Name & Language: none  Assessment: Patient experiencing Mental Health Concerns .   Intervention: Patient reported experiencing thoughts of suicide while here for PCP visit. He did take take 10 tablets for pain the other day after hurting his hand, though states he does not remember the name of the medication. He and his wife report he has been feeling down since being diagnosed with diabetes about a year ago. His wife expressed feeling bad that he feels this way and would like him to feel better.  He would like to engage with a mental health counselor. He agrees to check in with CSW on the phone next week as well. Provided information on [facility] [place] [facility] and [facility]) and their walk-in hours for counseling assessment. Patient and wife indicated they can do a walk-in sometime next week; they prefer the Spanish speaking counselor at [facility]. Provided list of crisis numbers as well and encouraged patient to call if thoughts of suicide return. Scheduled follow up call with patient for 08/01/22.   Review of patient status, including review of consultants reports, relevant laboratory and other test results, and collaboration with appropriate care team members and the patient's provider was performed as part of comprehensive patient evaluation and provision of services.    Estanislado Emms, [facility] [phone]

## 2022-07-26 LAB — COMPREHENSIVE METABOLIC PANEL
ALT: 28 IU/L (ref 0–44)
AST: 27 IU/L (ref 0–40)
Albumin/Globulin Ratio: 2.2 (ref 1.2–2.2)
Albumin: 4.4 g/dL (ref 4.1–5.1)
Alkaline Phosphatase: 101 IU/L (ref 44–121)
BUN/Creatinine Ratio: 19 (ref 9–20)
BUN: 15 mg/dL (ref 6–24)
Bilirubin Total: 0.5 mg/dL (ref 0.0–1.2)
CO2: 20 mmol/L (ref 20–29)
Calcium: 9 mg/dL (ref 8.7–10.2)
Chloride: 107 mmol/L — ABNORMAL HIGH (ref 96–106)
Creatinine, Ser: 0.8 mg/dL (ref 0.76–1.27)
Globulin, Total: 2 g/dL (ref 1.5–4.5)
Glucose: 148 mg/dL — ABNORMAL HIGH (ref 70–99)
Potassium: 4.4 mmol/L (ref 3.5–5.2)
Sodium: 141 mmol/L (ref 134–144)
Total Protein: 6.4 g/dL (ref 6.0–8.5)
eGFR: 111 mL/min/{1.73_m2} (ref >59)

## 2022-07-26 LAB — CBC
Hematocrit: 44.4 % (ref 37.5–51.0)
Hemoglobin: 15 g/dL (ref 13.0–17.7)
MCH: 32.2 pg (ref 26.6–33.0)
MCHC: 33.8 g/dL (ref 31.5–35.7)
MCV: 95 fL (ref 79–97)
Platelets: 233 10*3/uL (ref 150–450)
RBC: 4.66 x10E6/uL (ref 4.14–5.80)
RDW: 12.9 % (ref 11.6–15.4)
WBC: 5.6 10*3/uL (ref 3.4–10.8)

## 2022-07-26 LAB — TSH: TSH: 1.55 u[IU]/mL (ref 0.450–4.500)

## 2022-07-26 NOTE — Progress Notes (Signed)
Called pt and inform results.Gh 

## 2022-07-27 LAB — MICROALBUMIN / CREATININE URINE RATIO
Creatinine, Urine: 85.1 mg/dL
Microalb/Creat Ratio: <4 mg/g creat (ref 0–29)
Microalbumin, Urine: <3 ug/mL

## 2022-08-01 ENCOUNTER — Other Ambulatory Visit: Payer: Self-pay

## 2022-08-01 ENCOUNTER — Other Ambulatory Visit: Payer: Self-pay | Admitting: Nurse Practitioner

## 2022-08-01 ENCOUNTER — Ambulatory Visit (INDEPENDENT_AMBULATORY_CARE_PROVIDER_SITE_OTHER): Payer: Self-pay | Admitting: Clinical

## 2022-08-01 DIAGNOSIS — F32A Depression, unspecified: Secondary | ICD-10-CM

## 2022-08-01 MED ORDER — ESCITALOPRAM OXALATE 10 MG PO TABS
10.0000 mg | ORAL_TABLET | Freq: Every day | ORAL | 2 refills | Status: AC
  Filled 2022-08-01: qty 30, 30d supply, fill #0

## 2022-08-02 ENCOUNTER — Telehealth: Payer: Self-pay | Admitting: Clinical

## 2022-08-02 NOTE — Telephone Encounter (Signed)
Integrated Behavioral Health Progress Note  08/02/2022 Name: Rye Snape MRN: [id number] DOB: [birth year] Cobi Tafel is a 47 y.o. year old male who sees Fenton Foy, NP for primary care. LCSW was initially consulted to assist with mental health concerns.  Interpreter: Yes.     Interpreter Name & Language: Spanish  Assessment: Patient experiencing depression.  Ongoing Intervention: Today CSW called patient with interpreter. LVM advising that PCP has sent Lexapro to patient's pharmacy.  Estanislado Emms, [name] [phone]

## 2022-08-02 NOTE — Progress Notes (Signed)
Integrated Behavioral Health Progress Note  08/02/2022 Name: Drew [name] MRN: [id number] DOB: 1976/03/15 Drew [name] is a 47 y.o. year old male who sees Drew Foy, NP for primary care. LCSW was initially consulted to assist the patient with Mental Health Counseling and Resources.   Interpreter: Yes.     Interpreter Name & Language: Spanish  Assessment: Patient experiencing depression.  Ongoing Intervention: Today CSW called patient to check in and follow up on visit with PCP last week. Patient reported he has not gone yet for a walk-in assessment at [facility]) for counseling. Advised patient they also have psychiatry at [facility] He plans to go next week. He reports he is feeling a little better than last week. Encouraged patient to go for his counseling assessment. [facility] has Spanish Scientist, product/process development. Discussed possibility of PCP prescribing some medication for depression in the meantime until patient can get in with a psychiatrist. Coordinating with PCP on this.  Drew [name], [facility] [phone]

## 2022-08-04 LAB — COLOGUARD: COLOGUARD: NEGATIVE

## 2022-08-05 NOTE — Progress Notes (Signed)
Called Pt and inform results. [place]

## 2022-08-08 ENCOUNTER — Other Ambulatory Visit: Payer: Self-pay

## 2022-08-13 ENCOUNTER — Other Ambulatory Visit: Payer: Self-pay

## 2022-08-13 ENCOUNTER — Telehealth: Payer: Self-pay | Admitting: Nurse Practitioner

## 2022-08-13 DIAGNOSIS — E119 Type 2 diabetes mellitus without complications: Secondary | ICD-10-CM

## 2022-08-13 DIAGNOSIS — E785 Hyperlipidemia, unspecified: Secondary | ICD-10-CM

## 2022-08-13 MED ORDER — ATORVASTATIN CALCIUM 40 MG PO TABS
40.0000 mg | ORAL_TABLET | Freq: Every day | ORAL | 2 refills | Status: DC
  Filled 2022-08-13: qty 30, 30d supply, fill #0

## 2022-08-13 MED ORDER — METFORMIN HCL 500 MG PO TABS
500.0000 mg | ORAL_TABLET | Freq: Two times a day (BID) | ORAL | 2 refills | Status: DC
  Filled 2022-08-13: qty 60, 30d supply, fill #0

## 2022-08-13 NOTE — Telephone Encounter (Signed)
Caller & Relationship to patient:  MRN #  [id number]   Call Back Number:   Date of Last Office Visit: 08/02/2022     Date of Next Office Visit: 08/22/2022    Medication(s) to be Refilled: Metformin atorvastatin   Preferred Pharmacy:   ** Please notify patient to allow 48-72 hours to process** **Let patient know to contact pharmacy at the end of the day to make sure medication is ready. ** **If patient has not been seen in a year or longer, book an appointment **Advise to use MyChart for refill requests OR to contact their pharmacy

## 2022-08-13 NOTE — Telephone Encounter (Signed)
Medications sent to pt  pharmacy. [place]

## 2022-08-22 ENCOUNTER — Ambulatory Visit: Payer: Self-pay | Admitting: Nurse Practitioner

## 2022-08-27 ENCOUNTER — Encounter: Payer: Self-pay | Admitting: *Deleted

## 2022-09-09 ENCOUNTER — Other Ambulatory Visit: Payer: Self-pay | Admitting: Nurse Practitioner

## 2022-09-09 ENCOUNTER — Ambulatory Visit (HOSPITAL_COMMUNITY): Admission: RE | Admit: 2022-09-09 | Payer: Self-pay | Source: Ambulatory Visit

## 2022-09-09 ENCOUNTER — Encounter: Payer: Self-pay | Admitting: Nurse Practitioner

## 2022-09-09 ENCOUNTER — Ambulatory Visit (INDEPENDENT_AMBULATORY_CARE_PROVIDER_SITE_OTHER): Payer: Self-pay | Admitting: Nurse Practitioner

## 2022-09-09 ENCOUNTER — Other Ambulatory Visit: Payer: Self-pay

## 2022-09-09 VITALS — BP 119/77 | HR 69 | Temp 98.1°F | Wt 188.4 lb

## 2022-09-09 DIAGNOSIS — R2232 Localized swelling, mass and lump, left upper limb: Secondary | ICD-10-CM

## 2022-09-09 DIAGNOSIS — G4700 Insomnia, unspecified: Secondary | ICD-10-CM

## 2022-09-09 DIAGNOSIS — E785 Hyperlipidemia, unspecified: Secondary | ICD-10-CM

## 2022-09-09 DIAGNOSIS — E119 Type 2 diabetes mellitus without complications: Secondary | ICD-10-CM

## 2022-09-09 MED ORDER — HYDROXYZINE HCL 10 MG PO TABS
10.0000 mg | ORAL_TABLET | Freq: Three times a day (TID) | ORAL | 0 refills | Status: DC | PRN
  Filled 2022-09-09: qty 30, 10d supply, fill #0

## 2022-09-09 MED ORDER — ATORVASTATIN CALCIUM 40 MG PO TABS
40.0000 mg | ORAL_TABLET | Freq: Every day | ORAL | 2 refills | Status: DC
  Filled 2022-09-09: qty 30, 30d supply, fill #0
  Filled 2022-10-09: qty 30, 30d supply, fill #1
  Filled 2022-11-11: qty 30, 30d supply, fill #2

## 2022-09-09 MED ORDER — TRAZODONE HCL 50 MG PO TABS
25.0000 mg | ORAL_TABLET | Freq: Every evening | ORAL | 3 refills | Status: DC | PRN
  Filled 2022-09-09: qty 30, 30d supply, fill #0
  Filled 2022-10-09: qty 30, 30d supply, fill #1
  Filled 2022-11-11: qty 30, 30d supply, fill #2
  Filled 2022-12-16: qty 30, 30d supply, fill #3

## 2022-09-09 MED ORDER — CEPHALEXIN 500 MG PO CAPS
500.0000 mg | ORAL_CAPSULE | Freq: Two times a day (BID) | ORAL | 0 refills | Status: AC
  Filled 2022-09-09: qty 14, 7d supply, fill #0

## 2022-09-09 MED ORDER — METFORMIN HCL 500 MG PO TABS
500.0000 mg | ORAL_TABLET | Freq: Two times a day (BID) | ORAL | 2 refills | Status: DC
  Filled 2022-09-09: qty 60, 30d supply, fill #0
  Filled 2022-10-09: qty 60, 30d supply, fill #1
  Filled 2022-11-11: qty 60, 30d supply, fill #2

## 2022-09-09 MED ORDER — PREDNISONE 20 MG PO TABS
20.0000 mg | ORAL_TABLET | Freq: Every day | ORAL | 0 refills | Status: AC
  Filled 2022-09-09: qty 5, 5d supply, fill #0

## 2022-09-09 NOTE — Patient Instructions (Addendum)
1. Mass of arm, left  - VAS [place] UPPER EXTREMITY VENOUS DUPLEX - cephALEXin (KEFLEX) 500 MG capsule; Take 1 capsule (500 mg total) by mouth 2 (two) times daily for 7 days.  Dispense: 14 capsule; Refill: 0 - predniSONE (DELTASONE) 20 MG tablet; Take 1 tablet (20 mg total) by mouth daily with breakfast for 5 days.  Dispense: 5 tablet; Refill: 0  Will order [place]   Follow up:  Follow up in 3 months

## 2022-09-09 NOTE — Progress Notes (Signed)
@Patient  ID: Drew [name], male    DOB: May 26, 1975, 47 y.o.   MRN: [id number]  Chief Complaint  Patient presents with   Mass    Left arm and pain score 8 qd    Referring provider: Ivonne Andrew, NP   HPI  Patient presents today with left arm pain.  He states that he has had a lump in his left upper arm for 3 years.  He states that it has Become very painful and swollen.  We will order ultrasound today.  We will trial antibiotic steroid for any underlying infection or inflammation.  Patient also has concern for insomnia.  He is requesting a sleep aid.  Denies f/c/s, n/v/d, hemoptysis, PND, leg swelling Denies chest pain or edema      No Known Allergies  Immunization History  Administered Date(s) Administered   Influenza,inj,Quad PF,6+ Mos 06/06/2017, 03/06/2018, 03/28/2020, 04/03/2021, 04/25/2022   PFIZER(Purple Top)SARS-COV-2 Vaccination 07/29/2019, 08/19/2019   Pneumococcal Conjugate-13 06/29/2019   Pneumococcal Polysaccharide-23 06/06/2017   Tdap 02/20/2011, 06/06/2017    Past Medical History:  Diagnosis Date   Facial nerve palsy    Stroke (HCC)    Type 2 diabetes mellitus (HCC)     Tobacco History: Social History   Tobacco Use  Smoking Status Never  Smokeless Tobacco Never  Tobacco Comments   Pt states he smokes once or twice a month   Counseling given: Not Answered Tobacco comments: Pt states he smokes once or twice a month   Outpatient Encounter Medications as of 09/09/2022  Medication Sig   cephALEXin (KEFLEX) 500 MG capsule Take 1 capsule (500 mg total) by mouth 2 (two) times daily for 7 days.   ibuprofen (ADVIL) 200 MG tablet Take 200 mg by mouth every 6 (six) hours as needed for moderate pain.   predniSONE (DELTASONE) 20 MG tablet Take 1 tablet (20 mg total) by mouth daily with breakfast for 5 days.   traZODone (DESYREL) 50 MG tablet Take 0.5-1 tablets (25-50 mg total) by mouth at bedtime as needed for sleep.   [DISCONTINUED] atorvastatin (LIPITOR)  40 MG tablet Take 1 tablet (40 mg total) by mouth daily.   [DISCONTINUED] metFORMIN (GLUCOPHAGE) 500 MG tablet Take 1 tablet (500 mg total) by mouth 2 (two) times daily with a meal.   atorvastatin (LIPITOR) 40 MG tablet Take 1 tablet (40 mg total) by mouth daily.   escitalopram (LEXAPRO) 10 MG tablet Take 1 tablet (10 mg total) by mouth daily. (Patient not taking: Reported on 09/09/2022)   hydrOXYzine (ATARAX) 10 MG tablet Take 1 tablet (10 mg total) by mouth 3 (three) times daily as needed.   metFORMIN (GLUCOPHAGE) 500 MG tablet Take 1 tablet (500 mg total) by mouth 2 (two) times daily with a meal.   naproxen (NAPROSYN) 500 MG tablet Take 1 tablet (500 mg total) by mouth 2 (two) times daily with a meal. (Patient not taking: Reported on 04/25/2022)   [DISCONTINUED] hydrOXYzine (ATARAX) 10 MG tablet Take 1 tablet (10 mg total) by mouth 3 (three) times daily as needed. (Patient not taking: Reported on 09/09/2022)   No facility-administered encounter medications on file as of 09/09/2022.     Review of Systems  Review of Systems  Constitutional: Negative.   HENT: Negative.    Cardiovascular: Negative.   Gastrointestinal: Negative.   Musculoskeletal:        Lump to left upper arm  Allergic/Immunologic: Negative.   Neurological: Negative.   Psychiatric/Behavioral: Negative.  Physical Exam  BP 119/77   Pulse 69   Temp 98.1 F (36.7 C)   Wt 188 lb 6.4 oz (85.5 kg)   SpO2 99%   BMI 30.41 kg/m   Wt Readings from Last 5 Encounters:  09/09/22 188 lb 6.4 oz (85.5 kg)  07/25/22 188 lb 6.4 oz (85.5 kg)  04/25/22 185 lb (83.9 kg)  10/02/21 192 lb 6.4 oz (87.3 kg)  08/27/21 190 lb (86.2 kg)     Physical Exam Vitals and nursing note reviewed.  Constitutional:      General: He is not in acute distress.    Appearance: He is well-developed.  Cardiovascular:     Rate and Rhythm: Normal rate and regular rhythm.  Pulmonary:     Effort: Pulmonary effort is normal.     Breath  sounds: Normal breath sounds.  Musculoskeletal:       Arms:     Comments: Lump to left upper arm noted, no warmth or redness noted.   Skin:    General: Skin is warm and dry.  Neurological:     Mental Status: He is alert and oriented to person, place, and time.      Lab Results:  CBC    Component Value Date/Time   WBC 5.6 07/25/2022 0854   WBC 7.4 12/17/2017 2058   RBC 4.66 07/25/2022 0854   RBC 4.57 12/17/2017 2058   HGB 15.0 07/25/2022 0854   HCT 44.4 07/25/2022 0854   PLT 233 07/25/2022 0854   MCV 95 07/25/2022 0854   MCH 32.2 07/25/2022 0854   MCH 32.8 12/17/2017 2058   MCHC 33.8 07/25/2022 0854   MCHC 36.4 (H) 12/17/2017 2058   RDW 12.9 07/25/2022 0854   LYMPHSABS 3.2 12/17/2017 2058   MONOABS 0.5 12/17/2017 2058   EOSABS 0.2 12/17/2017 2058   BASOSABS 0.0 12/17/2017 2058    BMET    Component Value Date/Time   NA 141 07/25/2022 0854   K 4.4 07/25/2022 0854   CL 107 (H) 07/25/2022 0854   CO2 20 07/25/2022 0854   GLUCOSE 148 (H) 07/25/2022 0854   GLUCOSE 149 (H) 12/17/2017 2058   BUN 15 07/25/2022 0854   CREATININE 0.80 07/25/2022 0854   CALCIUM 9.0 07/25/2022 0854   GFRNONAA 112 03/28/2020 0942   GFRAA 129 03/28/2020 0942    BNP No results found for: "BNP"   Assessment & Plan:   Mass of arm, left - VAS [place] UPPER EXTREMITY VENOUS DUPLEX - cephALEXin (KEFLEX) 500 MG capsule; Take 1 capsule (500 mg total) by mouth 2 (two) times daily for 7 days.  Dispense: 14 capsule; Refill: 0 - predniSONE (DELTASONE) 20 MG tablet; Take 1 tablet (20 mg total) by mouth daily with breakfast for 5 days.  Dispense: 5 tablet; Refill: 0  Will order [place]  Follow up:  Follow up in 3 months     Ivonne Andrew, NP 09/09/2022

## 2022-09-09 NOTE — Assessment & Plan Note (Signed)
-   VAS [place] UPPER EXTREMITY VENOUS DUPLEX - cephALEXin (KEFLEX) 500 MG capsule; Take 1 capsule (500 mg total) by mouth 2 (two) times daily for 7 days.  Dispense: 14 capsule; Refill: 0 - predniSONE (DELTASONE) 20 MG tablet; Take 1 tablet (20 mg total) by mouth daily with breakfast for 5 days.  Dispense: 5 tablet; Refill: 0   Follow up:  Follow up in 3 months

## 2022-09-10 ENCOUNTER — Ambulatory Visit (HOSPITAL_COMMUNITY)
Admission: RE | Admit: 2022-09-10 | Discharge: 2022-09-10 | Disposition: A | Discharge status: Home / Self Care | Payer: Self-pay | Source: Ambulatory Visit | Attending: Nurse Practitioner | Admitting: Nurse Practitioner

## 2022-09-10 DIAGNOSIS — R2232 Localized swelling, mass and lump, left upper limb: Secondary | ICD-10-CM | POA: Insufficient documentation

## 2022-09-18 NOTE — Progress Notes (Signed)
Called pt and inform results.Gh 

## 2022-09-19 ENCOUNTER — Other Ambulatory Visit: Payer: Self-pay | Admitting: Nurse Practitioner

## 2022-09-19 DIAGNOSIS — D1722 Benign lipomatous neoplasm of skin and subcutaneous tissue of left arm: Secondary | ICD-10-CM

## 2022-09-23 NOTE — Progress Notes (Signed)
Called pt and advise. Gh

## 2022-10-09 ENCOUNTER — Other Ambulatory Visit: Payer: Self-pay

## 2022-11-11 ENCOUNTER — Other Ambulatory Visit: Payer: Self-pay

## 2022-12-16 ENCOUNTER — Other Ambulatory Visit: Payer: Self-pay | Admitting: Nurse Practitioner

## 2022-12-16 ENCOUNTER — Other Ambulatory Visit: Payer: Self-pay

## 2022-12-16 DIAGNOSIS — E119 Type 2 diabetes mellitus without complications: Secondary | ICD-10-CM

## 2022-12-16 DIAGNOSIS — E785 Hyperlipidemia, unspecified: Secondary | ICD-10-CM

## 2022-12-16 MED ORDER — METFORMIN HCL 500 MG PO TABS
500.0000 mg | ORAL_TABLET | Freq: Two times a day (BID) | ORAL | 2 refills | Status: DC
  Filled 2022-12-16: qty 180, 90d supply, fill #0

## 2022-12-16 MED ORDER — ATORVASTATIN CALCIUM 40 MG PO TABS
40.0000 mg | ORAL_TABLET | Freq: Every day | ORAL | 2 refills | Status: DC
  Filled 2022-12-16: qty 90, 90d supply, fill #0

## 2023-01-27 ENCOUNTER — Ambulatory Visit (INDEPENDENT_AMBULATORY_CARE_PROVIDER_SITE_OTHER): Payer: Self-pay | Admitting: Nurse Practitioner

## 2023-01-27 ENCOUNTER — Other Ambulatory Visit: Payer: Self-pay

## 2023-01-27 ENCOUNTER — Other Ambulatory Visit: Payer: Self-pay | Admitting: Nurse Practitioner

## 2023-01-27 ENCOUNTER — Encounter: Payer: Self-pay | Admitting: Nurse Practitioner

## 2023-01-27 VITALS — BP 114/84 | HR 63 | Temp 97.9°F | Ht 66.0 in | Wt 179.0 lb

## 2023-01-27 DIAGNOSIS — E119 Type 2 diabetes mellitus without complications: Secondary | ICD-10-CM

## 2023-01-27 DIAGNOSIS — Z13220 Encounter for screening for lipoid disorders: Secondary | ICD-10-CM

## 2023-01-27 DIAGNOSIS — G4700 Insomnia, unspecified: Secondary | ICD-10-CM

## 2023-01-27 LAB — POCT GLYCOSYLATED HEMOGLOBIN (HGB A1C): HbA1c, POC (controlled diabetic range): 6.7 % (ref 0.0–7.0)

## 2023-01-27 MED ORDER — METFORMIN HCL ER 500 MG PO TB24
500.0000 mg | ORAL_TABLET | Freq: Every day | ORAL | 1 refills | Status: DC
  Filled 2023-01-27: qty 90, 90d supply, fill #0

## 2023-01-27 MED ORDER — TRAZODONE HCL 50 MG PO TABS
25.0000 mg | ORAL_TABLET | Freq: Every evening | ORAL | 1 refills | Status: DC | PRN
  Filled 2023-01-27: qty 90, 90d supply, fill #0

## 2023-01-27 NOTE — Progress Notes (Signed)
Patient states no concerns.

## 2023-01-27 NOTE — Patient Instructions (Signed)
1. Diabetes mellitus without complication (HCC)  - POCT glycosylated hemoglobin (Hb A1C) - Lipid Panel - CBC - Comprehensive metabolic panel  2. Lipid screening  - Lipid Panel  3. Insomnia, unspecified type  - traZODone (DESYREL) 50 MG tablet; Take 0.5-1 tablets (25-50 mg total) by mouth at bedtime as needed for sleep.  Dispense: 90 tablet; Refill: 1  Follow up:  Follow up in 6 months

## 2023-01-27 NOTE — Progress Notes (Signed)
Subjective   Patient ID: Drew [name], male    DOB: 1975/12/01, 47 y.o.   MRN: [id number]  Chief Complaint  Patient presents with   Follow-up    Referring provider: Ivonne Andrew, NP  Ishraq Wanless is a 47 y.o. 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 male who presents for follow-up of Type 2 diabetes mellitus.   Patient is checking home blood sugars.   Home blood sugar records: BGs have been labile ranging between 64 and 120 How often is blood sugars being checked: daily Current symptoms/problems include hypoglycemia Will change metformin to 500 mg XR daily   A1C in office today was 6.7   Patient has history of anxiety and depression. Currently taking atarax as needed for anxiety.   Needs refills on trazodone  for insomnia     The following portions of the patient's history were reviewed and updated as appropriate: allergies, current medications, past medical history, past social history and problem list.   No Known Allergies  Immunization History  Administered Date(s) Administered   Influenza,inj,Quad PF,6+ Mos 06/06/2017, 03/06/2018, 03/28/2020, 04/03/2021, 04/25/2022   PFIZER(Purple Top)SARS-COV-2 Vaccination 07/29/2019, 08/19/2019   Pneumococcal Conjugate-13 06/29/2019   Pneumococcal Polysaccharide-23 06/06/2017   Tdap 02/20/2011, 06/06/2017    Tobacco History: Social History   Tobacco Use  Smoking Status Never  Smokeless Tobacco Never  Tobacco Comments   Pt states he smokes once or twice a month   Counseling given: Not Answered Tobacco comments: Pt states he smokes once or twice a month   Outpatient Encounter Medications as of 01/27/2023  Medication Sig   atorvastatin (LIPITOR) 40 MG tablet Take 1 tablet (40 mg total) by mouth daily.   hydrOXYzine (ATARAX) 10 MG tablet Take 1 tablet (10 mg total) by mouth 3 (three) times daily as needed.    ibuprofen (ADVIL) 200 MG tablet Take 200 mg by mouth every 6 (six) hours as needed for moderate pain.   metFORMIN (GLUCOPHAGE-XR) 500 MG 24 hr tablet Take 1 tablet (500 mg total) by mouth daily with breakfast.   [DISCONTINUED] metFORMIN (GLUCOPHAGE) 500 MG tablet Take 1 tablet (500 mg total) by mouth 2 (two) times daily with a meal.   [DISCONTINUED] traZODone (DESYREL) 50 MG tablet Take 0.5-1 tablets (25-50 mg total) by mouth at bedtime as needed for sleep.   escitalopram (LEXAPRO) 10 MG tablet Take 1 tablet (10 mg total) by mouth daily. (Patient not taking: Reported on 09/09/2022)   naproxen (NAPROSYN) 500 MG tablet Take 1 tablet (500 mg total) by mouth 2 (two) times daily with a meal. (Patient not taking: Reported on 04/25/2022)   traZODone (DESYREL) 50 MG tablet Take 0.5-1 tablets (25-50 mg total) by mouth at bedtime as needed for sleep.   No facility-administered encounter medications on file as of 01/27/2023.    Review of Systems  Review of Systems  Constitutional: Negative.   HENT: Negative.    Cardiovascular: Negative.   Gastrointestinal: Negative.   Allergic/Immunologic: Negative.   Neurological: Negative.   Psychiatric/Behavioral: Negative.       Objective:   BP 114/84   Pulse 63   Temp 97.9 F (36.6 C) (Oral)   Ht 5\' 6"  (1.676 m)   Wt 179 lb (81.2 kg)   SpO2 99%   BMI 28.89 kg/m   Wt Readings from Last 5 Encounters:  01/27/23 179 lb (81.2  kg)  09/09/22 188 lb 6.4 oz (85.5 kg)  07/25/22 188 lb 6.4 oz (85.5 kg)  04/25/22 185 lb (83.9 kg)  10/02/21 192 lb 6.4 oz (87.3 kg)     Physical Exam Vitals and nursing note reviewed.  Constitutional:      General: He is not in acute distress.    Appearance: He is well-developed.  Cardiovascular:     Rate and Rhythm: Normal rate and regular rhythm.  Pulmonary:     Effort: Pulmonary effort is normal.     Breath sounds: Normal breath sounds.  Skin:    General: Skin is warm and dry.  Neurological:     Mental Status:  He is alert and oriented to person, place, and time.     Last hemoglobin A1c Lab Results  Component Value Date   HGBA1C 6.7 01/27/2023      Assessment & Plan:   Diabetes mellitus without complication (HCC) -     POCT glycosylated hemoglobin (Hb A1C) -     Lipid panel -     CBC -     Comprehensive metabolic panel  Lipid screening -     Lipid panel  Insomnia, unspecified type -     traZODone HCl; Take 0.5-1 tablets (25-50 mg total) by mouth at bedtime as needed for sleep.  Dispense: 90 tablet; Refill: 1  Other orders -     metFORMIN HCl ER; Take 1 tablet (500 mg total) by mouth daily with breakfast.  Dispense: 90 tablet; Refill: 1     Return in about 6 months (around 07/27/2023).   Ivonne Andrew, NP 01/27/2023

## 2023-01-28 LAB — COMPREHENSIVE METABOLIC PANEL
ALT: 24 IU/L (ref 0–44)
AST: 25 IU/L (ref 0–40)
Albumin: 4.4 g/dL (ref 4.1–5.1)
Alkaline Phosphatase: 94 IU/L (ref 44–121)
BUN/Creatinine Ratio: 24 — ABNORMAL HIGH (ref 9–20)
BUN: 18 mg/dL (ref 6–24)
Bilirubin Total: 0.5 mg/dL (ref 0.0–1.2)
CO2: 21 mmol/L (ref 20–29)
Calcium: 8.8 mg/dL (ref 8.7–10.2)
Chloride: 109 mmol/L — ABNORMAL HIGH (ref 96–106)
Creatinine, Ser: 0.76 mg/dL (ref 0.76–1.27)
Globulin, Total: 2.1 g/dL (ref 1.5–4.5)
Glucose: 128 mg/dL — ABNORMAL HIGH (ref 70–99)
Potassium: 4.3 mmol/L (ref 3.5–5.2)
Sodium: 141 mmol/L (ref 134–144)
Total Protein: 6.5 g/dL (ref 6.0–8.5)
eGFR: 112 mL/min/{1.73_m2} (ref >59)

## 2023-01-28 LAB — CBC
Hematocrit: 42.4 % (ref 37.5–51.0)
Hemoglobin: 14.6 g/dL (ref 13.0–17.7)
MCH: 33 pg (ref 26.6–33.0)
MCHC: 34.4 g/dL (ref 31.5–35.7)
MCV: 96 fL (ref 79–97)
Platelets: 211 10*3/uL (ref 150–450)
RBC: 4.43 x10E6/uL (ref 4.14–5.80)
RDW: 12.4 % (ref 11.6–15.4)
WBC: 5.7 10*3/uL (ref 3.4–10.8)

## 2023-01-28 LAB — LIPID PANEL
Chol/HDL Ratio: 2.6 ratio (ref 0.0–5.0)
Cholesterol, Total: 119 mg/dL (ref 100–199)
HDL: 45 mg/dL (ref >39)
LDL Chol Calc (NIH): 60 mg/dL (ref 0–99)
Triglycerides: 67 mg/dL (ref 0–149)
VLDL Cholesterol Cal: 14 mg/dL (ref 5–40)

## 2023-04-29 ENCOUNTER — Other Ambulatory Visit: Payer: Self-pay

## 2023-04-29 ENCOUNTER — Other Ambulatory Visit: Payer: Self-pay | Admitting: Nurse Practitioner

## 2023-04-29 DIAGNOSIS — G4700 Insomnia, unspecified: Secondary | ICD-10-CM

## 2023-04-29 DIAGNOSIS — E785 Hyperlipidemia, unspecified: Secondary | ICD-10-CM

## 2023-04-29 MED ORDER — HYDROCODONE-ACETAMINOPHEN 5-325 MG PO TABS
1.0000 | ORAL_TABLET | Freq: Four times a day (QID) | ORAL | 0 refills | Status: DC | PRN
  Filled 2023-04-29: qty 15, 4d supply, fill #0

## 2023-04-29 MED ORDER — TRAZODONE HCL 50 MG PO TABS
25.0000 mg | ORAL_TABLET | Freq: Every evening | ORAL | 2 refills | Status: DC | PRN
  Filled 2023-04-29: qty 90, 90d supply, fill #0

## 2023-04-29 MED ORDER — ATORVASTATIN CALCIUM 40 MG PO TABS
40.0000 mg | ORAL_TABLET | Freq: Every day | ORAL | 2 refills | Status: DC
  Filled 2023-04-29: qty 30, 30d supply, fill #0
  Filled 2023-05-30: qty 30, 30d supply, fill #1

## 2023-04-29 MED ORDER — METFORMIN HCL ER 500 MG PO TB24
500.0000 mg | ORAL_TABLET | Freq: Every day | ORAL | 2 refills | Status: DC
  Filled 2023-04-29: qty 90, 90d supply, fill #0

## 2023-04-29 NOTE — Telephone Encounter (Signed)
Copied from CRM [id number]. Topic: Clinical - Medication Refill >> Apr 29, 2023 12:20 PM Desma Mcgregor wrote: Most Recent Primary Care Visit:  Provider: Ivonne Andrew  Department: [facility]  Visit Type: OFFICE VISIT  Date: 01/27/2023  Medication: metFORMIN (GLUCOPHAGE-XR) 500 MG 24 hr tablet traZODone (DESYREL) 50 MG tablet atorvastatin (LIPITOR) 40 MG tablet   Has the patient contacted their pharmacy? No (Agent: If no, request that the patient contact the pharmacy for the refill. If patient does not wish to contact the pharmacy document the reason why and proceed with request.) (Agent: If yes, when and what did the pharmacy advise?) No but patient said they would need to call it in as there are no refills left.  Is this the correct pharmacy for this prescription? Yes If no, delete pharmacy and type the correct one.  This is the patient's preferred pharmacy:  [facility] [address], [address] [place] [address] Phone: [phone] Fax: [phone]  Has the prescription been filled recently? Yes  Is the patient out of the medication? No, but only has a couple days left  Has the patient been seen for an appointment in the last year OR does the patient have an upcoming appointment? Yes  Can we respond through MyChart? No  Agent: Please be advised that Rx refills may take up to 3 business days. We ask that you follow-up with your pharmacy.

## 2023-05-02 ENCOUNTER — Telehealth: Payer: Self-pay

## 2023-05-02 ENCOUNTER — Other Ambulatory Visit: Payer: Self-pay

## 2023-05-02 NOTE — Telephone Encounter (Unsigned)
Copied from CRM [id number]. Topic: Clinical - Prescription Issue >> May 02, 2023  2:44 PM Carlatta H wrote: Reason for CRM: metFORMIN (GLUCOPHAGE-XR) 500 MG 24 hr tablet//Patient would like to change his medication to the one he previously has//Please call patient at [phone]//

## 2023-05-08 ENCOUNTER — Other Ambulatory Visit: Payer: Self-pay | Admitting: Nurse Practitioner

## 2023-05-08 ENCOUNTER — Other Ambulatory Visit: Payer: Self-pay

## 2023-05-08 MED ORDER — METFORMIN HCL ER 500 MG PO TB24
500.0000 mg | ORAL_TABLET | Freq: Every day | ORAL | 2 refills | Status: DC
  Filled 2023-05-08: qty 90, 90d supply, fill #0

## 2023-05-12 ENCOUNTER — Telehealth: Payer: Self-pay

## 2023-05-12 ENCOUNTER — Telehealth: Payer: Self-pay | Admitting: Nurse Practitioner

## 2023-05-12 NOTE — Telephone Encounter (Signed)
Sent to  [facility] .

## 2023-05-15 NOTE — Telephone Encounter (Signed)
 error

## 2023-05-30 ENCOUNTER — Other Ambulatory Visit: Payer: Self-pay

## 2023-06-12 ENCOUNTER — Other Ambulatory Visit: Payer: Self-pay

## 2023-06-12 ENCOUNTER — Ambulatory Visit (INDEPENDENT_AMBULATORY_CARE_PROVIDER_SITE_OTHER): Payer: Self-pay | Admitting: Nurse Practitioner

## 2023-06-12 ENCOUNTER — Encounter: Payer: Self-pay | Admitting: Nurse Practitioner

## 2023-06-12 VITALS — BP 122/76 | HR 62 | Temp 97.2°F | Wt 187.4 lb

## 2023-06-12 DIAGNOSIS — E119 Type 2 diabetes mellitus without complications: Secondary | ICD-10-CM

## 2023-06-12 DIAGNOSIS — E785 Hyperlipidemia, unspecified: Secondary | ICD-10-CM

## 2023-06-12 DIAGNOSIS — G4700 Insomnia, unspecified: Secondary | ICD-10-CM

## 2023-06-12 DIAGNOSIS — Z23 Encounter for immunization: Secondary | ICD-10-CM

## 2023-06-12 LAB — POCT GLYCOSYLATED HEMOGLOBIN (HGB A1C): Hemoglobin A1C: 7.2 % — AB (ref 4.0–5.6)

## 2023-06-12 MED ORDER — ATORVASTATIN CALCIUM 40 MG PO TABS
40.0000 mg | ORAL_TABLET | Freq: Every day | ORAL | 2 refills | Status: DC
  Filled 2023-06-12 (×2): qty 30, 30d supply, fill #0
  Filled 2023-07-14: qty 30, 30d supply, fill #1

## 2023-06-12 MED ORDER — HYDROXYZINE HCL 10 MG PO TABS
10.0000 mg | ORAL_TABLET | Freq: Three times a day (TID) | ORAL | 0 refills | Status: DC | PRN
  Filled 2023-06-12: qty 30, 10d supply, fill #0

## 2023-06-12 MED ORDER — TRAZODONE HCL 50 MG PO TABS
25.0000 mg | ORAL_TABLET | Freq: Every evening | ORAL | 2 refills | Status: DC | PRN
  Filled 2023-06-12 – 2023-07-14 (×2): qty 90, 90d supply, fill #0

## 2023-06-12 MED ORDER — METFORMIN HCL ER 500 MG PO TB24
500.0000 mg | ORAL_TABLET | Freq: Two times a day (BID) | ORAL | 2 refills | Status: DC
  Filled 2023-06-12 (×2): qty 90, 45d supply, fill #0
  Filled 2023-07-14: qty 90, 45d supply, fill #1

## 2023-06-12 NOTE — Addendum Note (Signed)
Addended by: Renelda Loma on: 06/12/2023 12:46 PM   Modules accepted: Orders

## 2023-06-12 NOTE — Progress Notes (Signed)
Subjective   Patient ID: Drew [name], male    DOB: 04/03/1976, 48 y.o.   MRN: [id number]  Chief Complaint  Patient presents with   Diabetes    Follow up, patient stated that his pills aren't working    Referring provider: Ivonne Andrew, NP  Drew [name] is a 48 y.o. 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 male who presents for follow-up of Type 2 diabetes mellitus.   Patient is checking home blood sugars.   Home blood sugar records: BGs have been labile ranging higher than usual How often is blood sugars being checked: daily Current symptoms/problems include hypoglycemia Will change metformin to 500 mg XR daily Last eye exam was in September   A1C in office today was 7.2   Patient has history of anxiety and depression. Currently taking atarax as needed for anxiety.    Needs refills on trazodone  for insomnia     The following portions of the patient's history were reviewed and updated as appropriate: allergies, current medications, past medical history, past social history and problem list.    No Known Allergies  Immunization History  Administered Date(s) Administered   Influenza,inj,Quad PF,6+ Mos 06/06/2017, 03/06/2018, 03/28/2020, 04/03/2021, 04/25/2022   PFIZER(Purple Top)SARS-COV-2 Vaccination 07/29/2019, 08/19/2019   Pneumococcal Conjugate-13 06/29/2019   Pneumococcal Polysaccharide-23 06/06/2017   Tdap 02/20/2011, 06/06/2017    Tobacco History: Social History   Tobacco Use  Smoking Status Never  Smokeless Tobacco Never  Tobacco Comments   Pt states he smokes once or twice a month   Counseling given: Not Answered Tobacco comments: Pt states he smokes once or twice a month   Outpatient Encounter Medications as of 06/12/2023  Medication Sig   [DISCONTINUED] atorvastatin (LIPITOR) 40 MG tablet Take 1 tablet (40 mg total) by mouth daily.    [DISCONTINUED] metFORMIN (GLUCOPHAGE-XR) 500 MG 24 hr tablet Take 1 tablet (500 mg total) by mouth daily with breakfast.   [DISCONTINUED] traZODone (DESYREL) 50 MG tablet Take 0.5-1 tablets (25-50 mg total) by mouth at bedtime as needed for sleep.   atorvastatin (LIPITOR) 40 MG tablet Take 1 tablet (40 mg total) by mouth daily.   escitalopram (LEXAPRO) 10 MG tablet Take 1 tablet (10 mg total) by mouth daily. (Patient not taking: Reported on 06/12/2023)   HYDROcodone-acetaminophen (NORCO/VICODIN) 5-325 MG tablet Take 1 tablet by mouth every 6 (six) hours as needed for severe pain (7-10). (Patient not taking: Reported on 06/12/2023)   hydrOXYzine (ATARAX) 10 MG tablet Take 1 tablet (10 mg total) by mouth 3 (three) times daily as needed. (Patient not taking: Reported on 06/12/2023)   ibuprofen (ADVIL) 200 MG tablet Take 200 mg by mouth every 6 (six) hours as needed for moderate pain. (Patient not taking: Reported on 06/12/2023)   metFORMIN (GLUCOPHAGE-XR) 500 MG 24 hr tablet Take 1 tablet (500 mg total) by mouth 2 (two) times daily with a meal.   naproxen (NAPROSYN) 500 MG tablet Take 1 tablet (500 mg total) by mouth 2 (two) times daily with a meal. (Patient not taking: Reported on 06/12/2023)   traZODone (DESYREL) 50 MG tablet Take 0.5-1 tablets (25-50 mg total) by mouth at bedtime as needed for sleep.   No facility-administered encounter medications on file as of 06/12/2023.    Review of Systems  Review of Systems  Constitutional: Negative.   HENT: Negative.  Cardiovascular: Negative.   Gastrointestinal: Negative.   Allergic/Immunologic: Negative.   Neurological: Negative.   Psychiatric/Behavioral: Negative.       Objective:   BP 122/76   Pulse 62   Temp (!) 97.2 F (36.2 C)   Wt 187 lb 6.4 oz (85 kg)   SpO2 98%   BMI 30.25 kg/m   Wt Readings from Last 5 Encounters:  06/12/23 187 lb 6.4 oz (85 kg)  01/27/23 179 lb (81.2 kg)  09/09/22 188 lb 6.4 oz (85.5 kg)  07/25/22 188 lb 6.4  oz (85.5 kg)  04/25/22 185 lb (83.9 kg)     Physical Exam Vitals and nursing note reviewed.  Constitutional:      General: He is not in acute distress.    Appearance: He is well-developed.  Cardiovascular:     Rate and Rhythm: Normal rate and regular rhythm.  Pulmonary:     Effort: Pulmonary effort is normal.     Breath sounds: Normal breath sounds.  Skin:    General: Skin is warm and dry.  Neurological:     Mental Status: He is alert and oriented to person, place, and time.       Assessment & Plan:   Type 2 diabetes mellitus without complication, without long-term current use of insulin (HCC) -     POCT glycosylated hemoglobin (Hb A1C) -     metFORMIN HCl ER; Take 1 tablet (500 mg total) by mouth 2 (two) times daily with a meal.  Dispense: 90 tablet; Refill: 2  Hyperlipidemia LDL goal <100 -     Atorvastatin Calcium; Take 1 tablet (40 mg total) by mouth daily.  Dispense: 30 tablet; Refill: 2  Insomnia, unspecified type -     traZODone HCl; Take 0.5-1 tablets (25-50 mg total) by mouth at bedtime as needed for sleep.  Dispense: 90 tablet; Refill: 2     Return in about 3 months (around 09/10/2023).   Ivonne Andrew, NP 06/12/2023

## 2023-06-12 NOTE — Patient Instructions (Signed)
1. Type 2 diabetes mellitus without complication, without long-term current use of insulin (HCC) (Primary)  - POCT glycosylated hemoglobin (Hb A1C) - metFORMIN (GLUCOPHAGE-XR) 500 MG 24 hr tablet; Take 1 tablet (500 mg total) by mouth 2 (two) times daily with a meal.  Dispense: 90 tablet; Refill: 2  2. Hyperlipidemia LDL goal <100  - atorvastatin (LIPITOR) 40 MG tablet; Take 1 tablet (40 mg total) by mouth daily.  Dispense: 30 tablet; Refill: 2  3. Insomnia, unspecified type  - traZODone (DESYREL) 50 MG tablet; Take 0.5-1 tablets (25-50 mg total) by mouth at bedtime as needed for sleep.  Dispense: 90 tablet; Refill: 2  Follow up:  Follow up in 3 months

## 2023-06-27 ENCOUNTER — Emergency Department (HOSPITAL_BASED_OUTPATIENT_CLINIC_OR_DEPARTMENT_OTHER): Payer: Self-pay

## 2023-06-27 ENCOUNTER — Emergency Department (HOSPITAL_BASED_OUTPATIENT_CLINIC_OR_DEPARTMENT_OTHER)
Admission: EM | Admit: 2023-06-27 | Discharge: 2023-06-27 | Disposition: A | Discharge status: Home / Self Care | Payer: Self-pay | Attending: Emergency Medicine | Admitting: Emergency Medicine

## 2023-06-27 ENCOUNTER — Other Ambulatory Visit: Payer: Self-pay

## 2023-06-27 DIAGNOSIS — M7989 Other specified soft tissue disorders: Secondary | ICD-10-CM | POA: Insufficient documentation

## 2023-06-27 DIAGNOSIS — E119 Type 2 diabetes mellitus without complications: Secondary | ICD-10-CM | POA: Insufficient documentation

## 2023-06-27 DIAGNOSIS — W312XXA Contact with powered woodworking and forming machines, initial encounter: Secondary | ICD-10-CM | POA: Insufficient documentation

## 2023-06-27 DIAGNOSIS — Z7984 Long term (current) use of oral hypoglycemic drugs: Secondary | ICD-10-CM | POA: Insufficient documentation

## 2023-06-27 DIAGNOSIS — Y9389 Activity, other specified: Secondary | ICD-10-CM | POA: Insufficient documentation

## 2023-06-27 DIAGNOSIS — Y92009 Unspecified place in unspecified non-institutional (private) residence as the place of occurrence of the external cause: Secondary | ICD-10-CM | POA: Insufficient documentation

## 2023-06-27 DIAGNOSIS — S61411A Laceration without foreign body of right hand, initial encounter: Secondary | ICD-10-CM | POA: Insufficient documentation

## 2023-06-27 MED ORDER — LIDOCAINE-EPINEPHRINE (PF) 2 %-1:200000 IJ SOLN
10.0000 mL | Freq: Once | INTRAMUSCULAR | Status: AC
  Administered 2023-06-27: 10 mL
  Filled 2023-06-27: qty 20

## 2023-06-27 MED ORDER — IBUPROFEN 600 MG PO TABS: 600.0000 mg | ORAL_TABLET | Freq: Four times a day (QID) | ORAL | 0 refills | Status: AC | PRN

## 2023-06-27 MED ORDER — CEPHALEXIN 500 MG PO CAPS: 500.0000 mg | ORAL_CAPSULE | Freq: Four times a day (QID) | ORAL | 0 refills | Status: AC

## 2023-06-27 MED ORDER — HYDROCODONE-ACETAMINOPHEN 5-325 MG PO TABS
1.0000 | ORAL_TABLET | Freq: Once | ORAL | Status: AC
  Administered 2023-06-27: 1 via ORAL
  Filled 2023-06-27: qty 1

## 2023-06-27 NOTE — ED Notes (Addendum)
Reviewed discharge instructions, follow up, and medications. Pt states understanding. EDP checked splint after completion. Cap refill and sensation intact

## 2023-06-27 NOTE — Discharge Instructions (Addendum)
As discussed, there is concern that you have cut one of the tendons on the back of your hand attaching to your pointer finger.  Your x-ray appeared normal so does not appear that you broke any bones or dislocated anything.  Recommend follow-up with a hand specialist on Monday.  Attached is their office information and they are expecting you. I would call them early during business hours on Monday to confirm the time they are wanting to see you.  Outpatient on antibiotics given concern for dirty wound.  Will also send you home with anti-inflammatories to take for pain.  Please keep splint in place until you see the orthopedic specialist on Monday.  You may evaluate hand above the level of your heart to help with swelling as well as ice the back of your hand to help with any swelling/inflammation.  Please do not hesitate to return if the worrisome signs and symptoms we discussed become apparent.

## 2023-06-27 NOTE — ED Provider Notes (Signed)
 [facility] Provider Note   CSN: [id number] Arrival date & time: 06/27/23  1606     History  Chief Complaint  Patient presents with   Hand Injury    Drew [name] is a 48 y.o. male.   Hand Injury   48 year old male presents emergency department with complaints of laceration.  Patient states that he was trying to rip a piece of wood on a table saw when his right hand slipped causing a laceration on the back of his right hand.  Patient does state that he is right-handed.  Reports last tetanus vaccine was around 2 to 3 years ago.  Denies trauma elsewhere.  Does report some loss of sensation on the back of his pointer finger near his finger nail.  States he is able to flex and extend affected digit but with pain.   Past medical history is significant for diabetes mellitus type 2, CVA, facial nerve palsy  Home Medications Prior to Admission medications   Medication Sig Start Date End Date Taking? Authorizing Provider  cephALEXin (KEFLEX) 500 MG capsule Take 1 capsule (500 mg total) by mouth 4 (four) times daily. 06/27/23  Yes Sherian Maroon A, PA  ibuprofen (ADVIL) 600 MG tablet Take 1 tablet (600 mg total) by mouth every 6 (six) hours as needed. 06/27/23  Yes Sherian Maroon A, PA  atorvastatin (LIPITOR) 40 MG tablet Take 1 tablet (40 mg total) by mouth daily. 06/12/23   Ivonne Andrew, NP  escitalopram (LEXAPRO) 10 MG tablet Take 1 tablet (10 mg total) by mouth daily. Patient not taking: Reported on 06/12/2023 08/01/22   Ivonne Andrew, NP  hydrOXYzine (ATARAX) 10 MG tablet Take 1 tablet (10 mg total) by mouth 3 (three) times daily as needed. 06/12/23   Ivonne Andrew, NP  metFORMIN (GLUCOPHAGE-XR) 500 MG 24 hr tablet Take 1 tablet (500 mg total) by mouth 2 (two) times daily with a meal. 06/12/23   Ivonne Andrew, NP  traZODone (DESYREL) 50 MG tablet Take 0.5-1 tablets (25-50 mg total) by mouth at bedtime as needed for sleep. 06/12/23    Ivonne Andrew, NP      Allergies    Patient has no known allergies.    Review of Systems   Review of Systems  All other systems reviewed and are negative.   Physical Exam Updated Vital Signs BP 121/85 (BP Location: Left Arm)   Pulse 66   Temp 98 F (36.7 C) (Oral)   Resp 16   SpO2 100%  Physical Exam Vitals and nursing note reviewed.  Constitutional:      General: He is not in acute distress.    Appearance: He is well-developed.  HENT:     Head: Normocephalic and atraumatic.  Eyes:     Conjunctiva/sclera: Conjunctivae normal.  Cardiovascular:     Rate and Rhythm: Normal rate and regular rhythm.     Heart sounds: No murmur heard. Pulmonary:     Effort: Pulmonary effort is normal. No respiratory distress.     Breath sounds: Normal breath sounds.  Abdominal:     Palpations: Abdomen is soft.     Tenderness: There is no abdominal tenderness.  Musculoskeletal:        General: No swelling.       Hands:     Cervical back: Neck supple.     Comments: 3.0 cm laceration appreciated dorsal aspect right distal second metacarpal.  Patient able to flex digit fully.  Patient with inability to extend digit at MCP, PIP or DIP.   Skin:    General: Skin is warm and dry.     Capillary Refill: Capillary refill takes less than 2 seconds.  Neurological:     Mental Status: He is alert.  Psychiatric:        Mood and Affect: Mood normal.     ED Results / Procedures / Treatments   Labs (all labs ordered are listed, but only abnormal results are displayed) Labs Reviewed - No data to display  EKG None  Radiology DG Hand Complete Right Result Date: 06/27/2023 CLINICAL DATA:  Recent table saw injury with dorsal skin laceration, initial encounter EXAM: RIGHT HAND - COMPLETE 3+ VIEW COMPARISON:  05/04/2022 FINDINGS: Healed fifth metacarpal fracture is noted. Degenerative changes are noted in the carpal bones. Slight increased sclerosis is noted in the lunate bone which may be related  to avascular necrosis. This is new from the prior exam. IMPRESSION: No acute bony injury. 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 Healed fifth metacarpal fracture. Electronically Signed   By: Alcide Clever M.D.   On: 06/27/2023 19:21    Procedures .Laceration Repair  Date/Time: 06/27/2023 6:43 PM  Performed by: Peter Garter, PA Authorized by: Peter Garter, PA   Consent:    Consent obtained:  Verbal   Consent given by:  Patient   Risks, benefits, and alternatives were discussed: yes     Risks discussed:  Infection, need for additional repair and nerve damage   Alternatives discussed:  Delayed treatment and no treatment Universal protocol:    Procedure explained and questions answered to patient or proxy's satisfaction: yes     Patient identity confirmed:  Verbally with patient Anesthesia:    Anesthesia method:  Local infiltration   Local anesthetic:  Lidocaine 2% WITH epi Laceration details:    Location:  Hand   Hand location:  R hand, dorsum   Length (cm):  3.1 Pre-procedure details:    Preparation:  Imaging obtained to evaluate for foreign bodies and patient was prepped and draped in usual sterile fashion Exploration:    Limited defect created (wound extended): no     Hemostasis achieved with:  Direct pressure   Imaging obtained: x-ray     Imaging outcome: foreign body not noted     Wound exploration: wound explored through full range of motion and entire depth of wound visualized     Wound extent: tendon damage     Tendon damage location:  Upper extremity   Upper extremity tendon damage location:  Finger extensor   Finger extensor tendon:  Extensor digitorum and extensor indicis   Tendon repair plan:  Refer for evaluation   Contaminated: no   Treatment:    Area cleansed with:  Saline   Amount of cleaning:  Standard   Irrigation solution:  Sterile water   Irrigation volume:  1L   Irrigation  method:  Syringe   Visualized foreign bodies/material removed: no     Debridement:  None   Undermining:  None   Scar revision: no   Skin repair:    Repair method:  Sutures   Suture size:  4-0   Suture material:  Prolene   Suture technique:  Simple interrupted   Number of sutures:  5 Approximation:    Approximation:  Close Repair type:    Repair type:  Simple Post-procedure details:    Dressing:  Non-adherent  dressing and splint for protection   Procedure completion:  Tolerated well, no immediate complications     Medications Ordered in ED Medications  lidocaine-EPINEPHrine (XYLOCAINE W/EPI) 2 %-1:200000 (PF) injection 10 mL (10 mLs Infiltration Given by Other 06/27/23 1829)  HYDROcodone-acetaminophen (NORCO/VICODIN) 5-325 MG per tablet 1 tablet (1 tablet Oral Given 06/27/23 1723)    ED Course/ Medical Decision Making/ A&P Clinical Course as of 06/28/23 1906  Fri Jun 27, 2023  1840 Consulted orthopedics Dr. Denese Killings who recommended closure while in the ED.  Reviewed x-ray imaging and was in agreement with no acute fracture or dislocation.  Recommended follow-up Monday in the office for planning for the OR visit with extensor tendon repair.  Also recommended sending patient home with Keflex. [CR]    Clinical Course User Index [CR] Peter Garter, PA                                 Medical Decision Making Amount and/or Complexity of Data Reviewed Radiology: ordered.  Risk Prescription drug management.   This patient presents to the ED for concern of laceration, this involves an extensive number of treatment options, and is a complaint that carries with it a high risk of complications and morbidity.  The differential diagnosis includes fracture, dislocation, ligamentous/tendinous injury, neurovascular compromise, foreign body retainment, other   Co morbidities that complicate the patient evaluation  See HPI   Additional history obtained:  Additional history  obtained from EMR External records from outside source obtained and reviewed including hospital records   Lab Tests:  N/a   Imaging Studies ordered:  I ordered imaging studies including right hand x-ray  I independently visualized and interpreted imaging which showed no acute osseous abnormality. I agree with the radiologist interpretation   Cardiac Monitoring: / EKG:  The patient was maintained on a cardiac monitor.  I personally viewed and interpreted the cardiac monitored which showed an underlying rhythm of: Sinus rhythm   Consultations Obtained:  See ED course  Problem List / ED Course / Critical interventions / Medication management  Right hand laceration I ordered medication including Norco, lidocaine with epinephrine   Reevaluation of the patient after these medicines showed that the patient improved I have reviewed the patients home medicines and have made adjustments as needed   Social Determinants of Health:  Cigarette use once or twice a month.  Denies illicit substance use.  Test / Admission - Considered:  Right hand laceration  Vitals signs within normal range and stable throughout visit. Imaging studies significant for: See above 48 year old male presents emergency department with complaints of laceration from table saw.  Laceration dorsal aspect of distal second metacarpal of right hand.  On exam, patient with inability to extend digit at MCP, PIP, DIP but with intact flexion.  Patient's pain adequately controlled with local that this is used and still with inability to extend digit.  Consulted orthopedics who recommended closure in the ED and follow-up on Monday in the outpatient setting for reassessment and planning of OR intervention.  Wound closed as above.  Will send patient home on Keflex.  Patient splinted via a volar short arm splint with fingers extended.  Recommend rest, ice, elevation, NSAIDs as well as keeping splint in place until followed up  with orthopedics on Monday.  Treatment plan discussed at length with patient and he acknowledged understanding was agreeable to said plan.  Patient will well-appearing, afebrile in  no acute distress. Worrisome signs and symptoms were discussed with the patient, and the patient acknowledged understanding to return to the ED if noticed. Patient was stable upon discharge.          Final Clinical Impression(s) / ED Diagnoses Final diagnoses:  Laceration of right hand involving tendon, initial encounter    Rx / DC Orders ED Discharge Orders          Ordered    cephALEXin (KEFLEX) 500 MG capsule  4 times daily        06/27/23 1838    ibuprofen (ADVIL) 600 MG tablet  Every 6 hours PRN        06/27/23 1838              Peter Garter, [place] 06/28/23 1906    Rolan Bucco, MD 07/05/23 [phone]

## 2023-06-27 NOTE — ED Triage Notes (Signed)
Pt reports he was working at home with a table saw and it cut his right hand. Bleeding controlled.

## 2023-06-30 ENCOUNTER — Ambulatory Visit (INDEPENDENT_AMBULATORY_CARE_PROVIDER_SITE_OTHER): Payer: Self-pay | Admitting: Orthopedic Surgery

## 2023-06-30 ENCOUNTER — Encounter (HOSPITAL_BASED_OUTPATIENT_CLINIC_OR_DEPARTMENT_OTHER): Payer: Self-pay | Admitting: Orthopedic Surgery

## 2023-06-30 ENCOUNTER — Telehealth: Payer: Self-pay

## 2023-06-30 ENCOUNTER — Other Ambulatory Visit: Payer: Self-pay

## 2023-06-30 DIAGNOSIS — S66321A Laceration of extensor muscle, fascia and tendon of left index finger at wrist and hand level, initial encounter: Secondary | ICD-10-CM

## 2023-06-30 NOTE — Transitions of Care (Post Inpatient/ED Visit) (Signed)
   06/30/2023  Name: Drew [name] MRN: [id number] DOB: 06/01/1975  Today's TOC FU Call Status:   Patient's Name and Date of Birth confirmed.  Transition Care Management Follow-up Telephone Call Date of Discharge: 06/27/23 Type of Discharge: Emergency Department Reason for ED Visit: Other: How have you been since you were released from the hospital?: Better Any questions or concerns?: No  Items Reviewed: Did you receive and understand the discharge instructions provided?: No Medications obtained,verified, and reconciled?: Yes (Medications Reviewed) Any new allergies since your discharge?: No Dietary orders reviewed?: No Do you have support at home?: Yes People in Home: spouse Name of Support/Comfort Primary Source: Wife  Medications Reviewed Today: Medications Reviewed Today     Reviewed by Veneta Penton, CMA (Certified Medical Assistant) on 06/30/23 at [phone]  Med List Status: <None>   Medication Order Taking? Sig Documenting Provider Last Dose Status Informant  atorvastatin (LIPITOR) 40 MG tablet [id number] Yes Take 1 tablet (40 mg total) by mouth daily. Ivonne Andrew, NP Taking Active   cephALEXin (KEFLEX) 500 MG capsule [id number] Yes Take 1 capsule (500 mg total) by mouth 4 (four) times daily. Peter Garter, PA Taking Active   escitalopram (LEXAPRO) 10 MG tablet [id number] Yes Take 1 tablet (10 mg total) by mouth daily. Ivonne Andrew, NP Taking Active   hydrOXYzine (ATARAX) 10 MG tablet [id number] Yes Take 1 tablet (10 mg total) by mouth 3 (three) times daily as needed. Ivonne Andrew, NP Taking Active   ibuprofen (ADVIL) 600 MG tablet [id number] Yes Take 1 tablet (600 mg total) by mouth every 6 (six) hours as needed. Peter Garter, PA Taking Active   metFORMIN (GLUCOPHAGE-XR) 500 MG 24 hr tablet [id number] Yes Take 1 tablet (500 mg total) by mouth 2 (two) times daily with a meal. Ivonne Andrew, NP Taking Active   traZODone (DESYREL) 50 MG tablet [id number] Yes Take  0.5-1 tablets (25-50 mg total) by mouth at bedtime as needed for sleep. Ivonne Andrew, NP Taking Active             Home Care and Equipment/Supplies: Were Home Health Services Ordered?: No Any new equipment or medical supplies ordered?: No  Functional Questionnaire: Do you need assistance with bathing/showering or dressing?: No Do you need assistance with meal preparation?: No Do you need assistance with eating?: No Do you have difficulty maintaining continence: No Do you need assistance with getting out of bed/getting out of a chair/moving?: No Do you have difficulty managing or taking your medications?: No  Follow up appointments reviewed: PCP Follow-up appointment confirmed?: Yes Date of PCP follow-up appointment?: 07/09/23 Follow-up Provider: Angus Seller Specialist [facility] Follow-up appointment confirmed?: Yes Date of Specialist follow-up appointment?: 06/30/23 Do you need transportation to your follow-up appointment?: No Do you understand care options if your condition(s) worsen?: Yes-patient verbalized understanding    SIGNATURE Maleea Camilo, CMA

## 2023-06-30 NOTE — H&P (View-Only) (Signed)
 Drew [name] - 48 y.o. male MRN [id number]  Date of birth: [birth year]  Office Visit Note: Visit Date: 06/30/2023 PCP: Ivonne Andrew, NP Referred by: Ivonne Andrew, NP  Subjective: No chief complaint on file.  HPI: Drew [name] is a pleasant 48 y.o. male who presents today for evaluation of a right hand injury to the dorsal aspect of the index finger at the MCP level sustained 3 days prior.  He was utilizing a table saw when the mechanism slipped and cut the dorsal aspect of the right hand.  Injury created in approximate 2 cm laceration in transverse fashion just proximal to the MCP level.  He was seen in the emergency department setting the day of his injury, underwent workup which showed likely extensor tendon injury of the index finger.  He underwent bedside irrigation and closure with close orthopedic follow-up.    He has had an inability to extend the index finger since time of injury.  Denies any significant numbness or tingling.  He is right-hand dominant overall active at baseline, works in [employer].  He is diabetic, recent A1c was 7.0.  Pertinent ROS were reviewed with the patient and found to be negative unless otherwise specified above in HPI.   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  Plan: Extensive discussion was had with the patient today regarding his right hand dorsal laceration injury to the index finger region.  He appears to have sustained an extensor tendon laceration to both the EDC and EIP of the index finger with complete inability to extend the index finger on examination today.  This is  at the level of the zone 5/zone 6 region of the right hand index finger.  Given his inability for extension, patient is indicated for wound exploration and likely extensor tendon repair of the index finger extensor apparatus.  I explained that we will attempt to repair both the [facility] and EIP tendons in order to recreate his original anatomy.  Risks and benefits of the procedure were discussed, risks including but not limited to infection, bleeding, scarring, stiffness, nerve injury, tendon injury, vascular injury, tendon rupture, recurrence of symptoms and need for subsequent operation.  Patient expressed understanding.  We will move forward with surgical scheduling of right hand laceration injury exploration, irrigation and debridement with extensor tendon repair index finger.  Surgery will be performed tomorrow in the outpatient setting.   Follow-up: No follow-ups on file.   Meds & Orders: No orders of the defined types were placed in this encounter.  No orders of the defined types were placed in this encounter.    Procedures: No procedures performed      Clinical History: No specialty comments available.  He reports that he has never smoked. He has never used smokeless tobacco.  Recent Labs    07/25/22 0909 01/27/23 0832 06/12/23 0859  HGBA1C 6.8* 6.7 7.2*    Objective:   Vital Signs: There were no vitals taken for this visit.  Physical Exam  Gen: Well-appearing, in no acute distress; non-toxic CV: Regular Rate. Well-perfused. Warm.  Resp: Breathing unlabored on room air; no wheezing. Psych: Fluid speech in conversation; appropriate affect; normal thought process  Ortho Exam Right hand: - Dorsal laceration over the index finger region, just proximal to the MCP region, measures approximately 2 cm in length, sutures in place with wound edges well-approximated - Unable to extend the index finger on examination today, tenodesis effect demonstrates inability for full extension of  the index finger - Sensation is intact to the distal aspect of the index finger and throughout the remainder of the hand in all distributions - Hand remains warm well-perfused - No significant erythema or drainage from the wound site  Imaging: No results found.  Past Medical/Family/Surgical/Social History: Medications & Allergies reviewed per EMR, new medications updated. Patient Active Problem List   Diagnosis Date Noted   Mass of arm, left 09/09/2022   Left elbow pain 08/27/2021   HLD (hyperlipidemia) 12/18/2017   Diabetes mellitus without complication (HCC) 12/18/2017   TIA (transient ischemic attack) 12/18/2017   Right-sided muscle weakness 05/08/2017   Right sided numbness 05/08/2017   Headache 05/08/2017   Stroke-like symptoms 05/08/2017   Non-English speaking patient 05/08/2017   Weakness 05/07/2017   Past Medical History:  Diagnosis Date   Anxiety    Depression    Facial nerve palsy    Stroke (HCC)    Type 2 diabetes mellitus (HCC)    Family History  Problem Relation Age of Onset   Stroke Father    Past Surgical History:  Procedure Laterality Date   APPENDECTOMY     Social History   Occupational History   Not on file  Tobacco Use   Smoking status: Never   Smokeless tobacco: Never   Tobacco comments:    Pt states he smokes once or twice a month  Vaping Use   Vaping status: Never Used  Substance and Sexual Activity   Alcohol use: Yes    Comment: weekly   Drug use: No   Sexual activity: Yes    Birth control/protection: None    Padraig Nhan Trevor Mace, M.D. [facility], Hand Surgery

## 2023-06-30 NOTE — Progress Notes (Signed)
 Drew [name] - 48 y.o. male MRN [id number]  Date of birth: [birth year]  Office Visit Note: Visit Date: 06/30/2023 PCP: Ivonne Andrew, NP Referred by: Ivonne Andrew, NP  Subjective: No chief complaint on file.  HPI: Drew [name] is a pleasant 48 y.o. male who presents today for evaluation of a right hand injury to the dorsal aspect of the index finger at the MCP level sustained 3 days prior.  He was utilizing a table saw when the mechanism slipped and cut the dorsal aspect of the right hand.  Injury created in approximate 2 cm laceration in transverse fashion just proximal to the MCP level.  He was seen in the emergency department setting the day of his injury, underwent workup which showed likely extensor tendon injury of the index finger.  He underwent bedside irrigation and closure with close orthopedic follow-up.    He has had an inability to extend the index finger since time of injury.  Denies any significant numbness or tingling.  He is right-hand dominant overall active at baseline, works in [employer].  He is diabetic, recent A1c was 7.0.  Pertinent ROS were reviewed with the patient and found to be negative unless otherwise specified above in HPI.   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  Plan: Extensive discussion was had with the patient today regarding his right hand dorsal laceration injury to the index finger region.  He appears to have sustained an extensor tendon laceration to both the EDC and EIP of the index finger with complete inability to extend the index finger on examination today.  This is  at the level of the zone 5/zone 6 region of the right hand index finger.  Given his inability for extension, patient is indicated for wound exploration and likely extensor tendon repair of the index finger extensor apparatus.  I explained that we will attempt to repair both the [facility] and EIP tendons in order to recreate his original anatomy.  Risks and benefits of the procedure were discussed, risks including but not limited to infection, bleeding, scarring, stiffness, nerve injury, tendon injury, vascular injury, tendon rupture, recurrence of symptoms and need for subsequent operation.  Patient expressed understanding.  We will move forward with surgical scheduling of right hand laceration injury exploration, irrigation and debridement with extensor tendon repair index finger.  Surgery will be performed tomorrow in the outpatient setting.   Follow-up: No follow-ups on file.   Meds & Orders: No orders of the defined types were placed in this encounter.  No orders of the defined types were placed in this encounter.    Procedures: No procedures performed      Clinical History: No specialty comments available.  He reports that he has never smoked. He has never used smokeless tobacco.  Recent Labs    07/25/22 0909 01/27/23 0832 06/12/23 0859  HGBA1C 6.8* 6.7 7.2*    Objective:   Vital Signs: There were no vitals taken for this visit.  Physical Exam  Gen: Well-appearing, in no acute distress; non-toxic CV: Regular Rate. Well-perfused. Warm.  Resp: Breathing unlabored on room air; no wheezing. Psych: Fluid speech in conversation; appropriate affect; normal thought process  Ortho Exam Right hand: - Dorsal laceration over the index finger region, just proximal to the MCP region, measures approximately 2 cm in length, sutures in place with wound edges well-approximated - Unable to extend the index finger on examination today, tenodesis effect demonstrates inability for full extension of  the index finger - Sensation is intact to the distal aspect of the index finger and throughout the remainder of the hand in all distributions - Hand remains warm well-perfused - No significant erythema or drainage from the wound site  Imaging: No results found.  Past Medical/Family/Surgical/Social History: Medications & Allergies reviewed per EMR, new medications updated. Patient Active Problem List   Diagnosis Date Noted   Mass of arm, left 09/09/2022   Left elbow pain 08/27/2021   HLD (hyperlipidemia) 12/18/2017   Diabetes mellitus without complication (HCC) 12/18/2017   TIA (transient ischemic attack) 12/18/2017   Right-sided muscle weakness 05/08/2017   Right sided numbness 05/08/2017   Headache 05/08/2017   Stroke-like symptoms 05/08/2017   Non-English speaking patient 05/08/2017   Weakness 05/07/2017   Past Medical History:  Diagnosis Date   Anxiety    Depression    Facial nerve palsy    Stroke (HCC)    Type 2 diabetes mellitus (HCC)    Family History  Problem Relation Age of Onset   Stroke Father    Past Surgical History:  Procedure Laterality Date   APPENDECTOMY     Social History   Occupational History   Not on file  Tobacco Use   Smoking status: Never   Smokeless tobacco: Never   Tobacco comments:    Pt states he smokes once or twice a month  Vaping Use   Vaping status: Never Used  Substance and Sexual Activity   Alcohol use: Yes    Comment: weekly   Drug use: No   Sexual activity: Yes    Birth control/protection: None    Padraig Nhan Trevor Mace, M.D. [facility], Hand Surgery

## 2023-07-01 ENCOUNTER — Ambulatory Visit (HOSPITAL_BASED_OUTPATIENT_CLINIC_OR_DEPARTMENT_OTHER): Payer: Self-pay | Admitting: Anesthesiology

## 2023-07-01 ENCOUNTER — Encounter (HOSPITAL_BASED_OUTPATIENT_CLINIC_OR_DEPARTMENT_OTHER)
Admission: RE | Disposition: A | Discharge status: Home / Self Care | Payer: Self-pay | Source: Home / Self Care | Attending: Orthopedic Surgery

## 2023-07-01 ENCOUNTER — Other Ambulatory Visit: Payer: Self-pay

## 2023-07-01 ENCOUNTER — Encounter (HOSPITAL_BASED_OUTPATIENT_CLINIC_OR_DEPARTMENT_OTHER): Payer: Self-pay | Admitting: Orthopedic Surgery

## 2023-07-01 ENCOUNTER — Ambulatory Visit (HOSPITAL_BASED_OUTPATIENT_CLINIC_OR_DEPARTMENT_OTHER)
Admission: RE | Admit: 2023-07-01 | Discharge: 2023-07-01 | Disposition: A | Discharge status: Home / Self Care | Payer: Self-pay | Attending: Orthopedic Surgery | Admitting: Orthopedic Surgery

## 2023-07-01 DIAGNOSIS — S61401A Unspecified open wound of right hand, initial encounter: Secondary | ICD-10-CM

## 2023-07-01 DIAGNOSIS — S56491A Other injury of extensor muscle, fascia and tendon of right index finger at forearm level, initial encounter: Secondary | ICD-10-CM

## 2023-07-01 DIAGNOSIS — S66821A Laceration of other specified muscles, fascia and tendons at wrist and hand level, right hand, initial encounter: Secondary | ICD-10-CM

## 2023-07-01 DIAGNOSIS — Z01818 Encounter for other preprocedural examination: Secondary | ICD-10-CM

## 2023-07-01 DIAGNOSIS — E119 Type 2 diabetes mellitus without complications: Secondary | ICD-10-CM | POA: Insufficient documentation

## 2023-07-01 DIAGNOSIS — W298XXA Contact with other powered powered hand tools and household machinery, initial encounter: Secondary | ICD-10-CM | POA: Insufficient documentation

## 2023-07-01 DIAGNOSIS — S66320A Laceration of extensor muscle, fascia and tendon of right index finger at wrist and hand level, initial encounter: Secondary | ICD-10-CM | POA: Insufficient documentation

## 2023-07-01 HISTORY — DX: Depression, unspecified: F32.A

## 2023-07-01 HISTORY — DX: Anxiety disorder, unspecified: F41.9

## 2023-07-01 HISTORY — PX: REPAIR EXTENSOR TENDON: SHX5382

## 2023-07-01 LAB — BASIC METABOLIC PANEL
Anion gap: 12 (ref 5–15)
BUN: 15 mg/dL (ref 6–20)
CO2: 19 mmol/L — ABNORMAL LOW (ref 22–32)
Calcium: 9.2 mg/dL (ref 8.9–10.3)
Chloride: 107 mmol/L (ref 98–111)
Creatinine, Ser: 0.81 mg/dL (ref 0.61–1.24)
GFR, Estimated: >60 mL/min (ref >60)
Glucose, Bld: 116 mg/dL — ABNORMAL HIGH (ref 70–99)
Potassium: 3.8 mmol/L (ref 3.5–5.1)
Sodium: 138 mmol/L (ref 135–145)

## 2023-07-01 LAB — GLUCOSE, CAPILLARY
Glucose-Capillary: 114 mg/dL — ABNORMAL HIGH (ref 70–99)
Glucose-Capillary: 107 mg/dL — ABNORMAL HIGH (ref 70–99)

## 2023-07-01 SURGERY — REPAIR, TENDON, EXTENSOR
Anesthesia: General | Site: Finger | Laterality: Right

## 2023-07-01 MED ORDER — LACTATED RINGERS IV SOLN: INTRAVENOUS | Status: DC

## 2023-07-01 MED ORDER — ONDANSETRON HCL 4 MG/2ML IJ SOLN
INTRAMUSCULAR | Status: AC
  Filled 2023-07-01: qty 2

## 2023-07-01 MED ORDER — ROPIVACAINE HCL 5 MG/ML IJ SOLN
INTRAMUSCULAR | Status: AC
  Filled 2023-07-01: qty 30

## 2023-07-01 MED ORDER — LIDOCAINE 2% (20 MG/ML) 5 ML SYRINGE
INTRAMUSCULAR | Status: AC
  Filled 2023-07-01: qty 5

## 2023-07-01 MED ORDER — PROPOFOL 10 MG/ML IV BOLUS
INTRAVENOUS | Status: AC
  Filled 2023-07-01: qty 20

## 2023-07-01 MED ORDER — FENTANYL CITRATE (PF) 100 MCG/2ML IJ SOLN
INTRAMUSCULAR | Status: AC
  Filled 2023-07-01: qty 2

## 2023-07-01 MED ORDER — ONDANSETRON HCL 4 MG/2ML IJ SOLN
INTRAMUSCULAR | Status: DC | PRN
  Administered 2023-07-01: 4 mg via INTRAVENOUS

## 2023-07-01 MED ORDER — FENTANYL CITRATE (PF) 100 MCG/2ML IJ SOLN
INTRAMUSCULAR | Status: DC | PRN
  Administered 2023-07-01 (×2): 50 ug via INTRAVENOUS
  Administered 2023-07-01: 100 ug via INTRAVENOUS

## 2023-07-01 MED ORDER — CEFAZOLIN SODIUM-DEXTROSE 2-4 GM/100ML-% IV SOLN
INTRAVENOUS | Status: AC
  Filled 2023-07-01: qty 100

## 2023-07-01 MED ORDER — MIDAZOLAM HCL 2 MG/2ML IJ SOLN
INTRAMUSCULAR | Status: AC
  Filled 2023-07-01: qty 2

## 2023-07-01 MED ORDER — PROPOFOL 10 MG/ML IV BOLUS
INTRAVENOUS | Status: DC | PRN
  Administered 2023-07-01: 200 mg via INTRAVENOUS

## 2023-07-01 MED ORDER — MIDAZOLAM HCL 5 MG/5ML IJ SOLN
INTRAMUSCULAR | Status: DC | PRN
  Administered 2023-07-01: 2 mg via INTRAVENOUS

## 2023-07-01 MED ORDER — ROPIVACAINE HCL 5 MG/ML IJ SOLN
INTRAMUSCULAR | Status: DC | PRN
  Administered 2023-07-01: 10 mL

## 2023-07-01 MED ORDER — DEXAMETHASONE SODIUM PHOSPHATE 4 MG/ML IJ SOLN
INTRAMUSCULAR | Status: DC | PRN
  Administered 2023-07-01: 5 mg via INTRAVENOUS

## 2023-07-01 MED ORDER — CEFAZOLIN SODIUM-DEXTROSE 2-4 GM/100ML-% IV SOLN
2.0000 g | INTRAVENOUS | Status: AC
Start: 2023-07-01 — End: 2023-07-01
  Administered 2023-07-01: 2 g via INTRAVENOUS

## 2023-07-01 MED ORDER — KETOROLAC TROMETHAMINE 30 MG/ML IJ SOLN
INTRAMUSCULAR | Status: DC | PRN
  Administered 2023-07-01: 30 mg via INTRAVENOUS

## 2023-07-01 MED ORDER — OXYCODONE HCL 5 MG PO TABS
5.0000 mg | ORAL_TABLET | Freq: Four times a day (QID) | ORAL | 0 refills | Status: AC | PRN
  Filled 2023-07-01: qty 20, 5d supply, fill #0

## 2023-07-01 MED ORDER — OXYCODONE HCL 5 MG/5ML PO SOLN: 5.0000 mg | Freq: Once | ORAL | Status: AC | PRN

## 2023-07-01 MED ORDER — OXYCODONE HCL 5 MG PO TABS
5.0000 mg | ORAL_TABLET | Freq: Once | ORAL | Status: AC | PRN
  Administered 2023-07-01: 5 mg via ORAL

## 2023-07-01 MED ORDER — ONDANSETRON HCL 4 MG/2ML IJ SOLN: 4.0000 mg | Freq: Four times a day (QID) | INTRAMUSCULAR | Status: DC | PRN

## 2023-07-01 MED ORDER — LIDOCAINE 2% (20 MG/ML) 5 ML SYRINGE
INTRAMUSCULAR | Status: DC | PRN
  Administered 2023-07-01: 100 mg via INTRAVENOUS

## 2023-07-01 MED ORDER — 0.9 % SODIUM CHLORIDE (POUR BTL) OPTIME
TOPICAL | Status: DC | PRN
  Administered 2023-07-01: 500 mL

## 2023-07-01 MED ORDER — KETOROLAC TROMETHAMINE 30 MG/ML IJ SOLN
INTRAMUSCULAR | Status: AC
  Filled 2023-07-01: qty 1

## 2023-07-01 MED ORDER — OXYCODONE HCL 5 MG PO TABS
ORAL_TABLET | ORAL | Status: AC
  Filled 2023-07-01: qty 1

## 2023-07-01 MED ORDER — SODIUM CHLORIDE 0.9 % IV SOLN: INTRAVENOUS | Status: DC | PRN

## 2023-07-01 MED ORDER — FENTANYL CITRATE (PF) 100 MCG/2ML IJ SOLN
25.0000 ug | INTRAMUSCULAR | Status: DC | PRN
  Administered 2023-07-01 (×2): 25 ug via INTRAVENOUS

## 2023-07-01 SURGICAL SUPPLY — 48 items
BLADE SURG 15 STRL LF DISP TIS (BLADE) ×2 IMPLANT
BNDG COHESIVE 4X5 TAN STRL LF (GAUZE/BANDAGES/DRESSINGS) ×1 IMPLANT
BNDG ELASTIC 4INX 5YD STR LF (GAUZE/BANDAGES/DRESSINGS) ×2 IMPLANT
BNDG ESMARK 4X9 LF (GAUZE/BANDAGES/DRESSINGS) ×1 IMPLANT
BNDG GAUZE DERMACEA FLUFF 4 (GAUZE/BANDAGES/DRESSINGS) ×1 IMPLANT
CHLORAPREP W/TINT 26 (MISCELLANEOUS) ×1 IMPLANT
CORD BIPOLAR FORCEPS 12FT (ELECTRODE) ×1 IMPLANT
COVER BACK TABLE 60X90IN (DRAPES) ×1 IMPLANT
CUFF TOURN SGL QUICK 18X4 (TOURNIQUET CUFF) ×1 IMPLANT
DRAPE HAND 75INX146IN 110IN (DRAPES) ×1 IMPLANT
DRAPE SURG 17X23 STRL (DRAPES) ×1 IMPLANT
GAUZE PAD ABD 8X10 STRL (GAUZE/BANDAGES/DRESSINGS) IMPLANT
GAUZE SPONGE 4X4 12PLY STRL (GAUZE/BANDAGES/DRESSINGS) ×1 IMPLANT
GAUZE STRETCH 2X75IN STRL (MISCELLANEOUS) IMPLANT
GAUZE XEROFORM 1X8 LF (GAUZE/BANDAGES/DRESSINGS) IMPLANT
GLOVE BIO SURGEON STRL SZ7.5 (GLOVE) ×2 IMPLANT
GLOVE BIOGEL PI IND STRL 7.5 (GLOVE) ×1 IMPLANT
GOWN STRL REUS W/ TWL LRG LVL3 (GOWN DISPOSABLE) ×1 IMPLANT
GOWN STRL SURGICAL XL XLNG (GOWN DISPOSABLE) ×2 IMPLANT
MANIFOLD NEPTUNE II (INSTRUMENTS) ×1 IMPLANT
NDL HYPO 25X5/8 SAFETYGLIDE (NEEDLE) IMPLANT
NEEDLE HYPO 25X5/8 SAFETYGLIDE (NEEDLE) IMPLANT
NS IRRIG 1000ML POUR BTL (IV SOLUTION) ×1 IMPLANT
PACK BASIN DAY SURGERY FS (CUSTOM PROCEDURE TRAY) ×1 IMPLANT
PAD CAST 3X4 CTTN HI CHSV (CAST SUPPLIES) ×1 IMPLANT
SHEET MEDIUM DRAPE 40X70 STRL (DRAPES) ×1 IMPLANT
SPIKE FLUID TRANSFER (MISCELLANEOUS) IMPLANT
SPLINT PLASTER CAST XFAST 4X15 (CAST SUPPLIES) ×1 IMPLANT
SPONGE SURGIFOAM ABS GEL 12-7 (HEMOSTASIS) IMPLANT
STOCKINETTE IMPERVIOUS 9X36 MD (GAUZE/BANDAGES/DRESSINGS) ×1 IMPLANT
SUCTION TUBE FRAZIER 10FR DISP (SUCTIONS) IMPLANT
SUT CHROMIC 3 0 SH 27 (SUTURE) IMPLANT
SUT ETHILON 4 0 PS 2 18 (SUTURE) IMPLANT
SUT FIBERWIRE 3-0 18 TAPR NDL (SUTURE) ×1 IMPLANT
SUT FIBERWIRE 4-0 18 TAPR NDL (SUTURE) ×1 IMPLANT
SUT MNCRL AB 3-0 PS2 27 (SUTURE) IMPLANT
SUT MNCRL AB 4-0 PS2 18 (SUTURE) IMPLANT
SUT PDS AB 4-0 RB1 27 (SUTURE) IMPLANT
SUT PROLENE 5 0 P 3 (SUTURE) IMPLANT
SUT SILK 4 0 PS 2 (SUTURE) IMPLANT
SUT VIC AB 3-0 PS2 18XBRD (SUTURE) IMPLANT
SUTURE FIBERWR 3-0 18 TAPR NDL (SUTURE) IMPLANT
SUTURE FIBERWR 4-0 18 TAPR NDL (SUTURE) IMPLANT
SYR BULB EAR ULCER 3OZ GRN STR (SYRINGE) ×2 IMPLANT
SYR CONTROL 10ML LL (SYRINGE) IMPLANT
TOWEL GREEN STERILE FF (TOWEL DISPOSABLE) ×2 IMPLANT
TUBE CONNECTING 20X1/4 (TUBING) IMPLANT
UNDERPAD 30X36 HEAVY ABSORB (UNDERPADS AND DIAPERS) ×1 IMPLANT

## 2023-07-01 NOTE — Op Note (Addendum)
 NAME: Drew [name] MEDICAL RECORD NO: [id number] DATE OF BIRTH: 1975/07/17 FACILITY: [facility] LOCATION: [facility] PHYSICIAN: Samuella Cota, MD   OPERATIVE REPORT   DATE OF PROCEDURE: 07/01/23    PREOPERATIVE DIAGNOSIS: Right index finger extensor tendon laceration   POSTOPERATIVE DIAGNOSIS: Right index finger extensor tendon laceration   PROCEDURE: Right index finger extensor tendon repair, zone 5   SURGEON:  Samuella Cota, M.D.   ASSISTANT: Glynn Octave, OPA   ANESTHESIA:  General   INTRAVENOUS FLUIDS:  Per anesthesia flow sheet.   ESTIMATED BLOOD LOSS:  Minimal.   COMPLICATIONS:  None.   SPECIMENS:  none   TOURNIQUET TIME:   32 minutes   DISPOSITION:  Stable to PACU.   INDICATIONS: 48 year old male who sustained a right hand dorsal laceration injury to the index finger region.  After clinical workup, he was diagnosed with extensor tendon laceration to both the EDC and EIP of the index finger with complete inability to extend the index finger on examination.  This is at the level of the zone 5 region of the right hand index finger.  Given his inability for extension, patient is indicated for wound exploration and likely extensor tendon repair of the index finger extensor apparatus.  I explained that we will attempt to repair both the [facility] and EIP tendons in order to recreate his original anatomy.   Risks and benefits of the procedure were discussed, risks including but not limited to infection, bleeding, scarring, stiffness, nerve injury, tendon injury, vascular injury, tendon rupture, recurrence of symptoms and need for subsequent operation.  Patient expressed understanding.  We will move forward with right hand laceration injury exploration, irrigation and debridement with extensor tendon repair index finger.     OPERATIVE COURSE: Patient was seen and identified in the preoperative area and marked appropriately.  Surgical consent had been signed.  Preoperative IV antibiotic prophylaxis was given. He was transferred to the operating room and placed in supine position with the Right upper extremity on an arm board.  General anesthesia was induced by the anesthesiologist.  Right upper extremity was prepped and draped in normal sterile orthopedic fashion.  A surgical pause was performed between the surgeons, anesthesia, and operating room staff and all were in agreement as to the patient, procedure, and site of procedure.  Tourniquet was placed and padded appropriately to the right upper arm.  The arm was exsanguinated and the tourniquet inflated 250 mmHg.  Previous laceration site was opened bluntly, and extended both proximally and distally for appropriate visualization.  We were able to identify the proximal and distal stumps of the extensor tendon of the index finger.  We were able to identify both the EIP and EDC tendons, both had been completely lacerated.  There was some damage tissue to the proximal stump, tongue depressor an 11 blade was utilized to freshen the tendon back to healthy tissue, approximately 0.5 cm was resected.  Under deeper dissection, we appreciated a small capsular arthrotomy had been sustained from the initial injury to the MCP joint of the index finger.  Copious irrigation was performed of the MCP joint and a capsular closure was performed as well using 3-0 Vicryl in figure-of-eight fashion.  Then, under appropriate tensioning, repair was performed of the [facility] tendon which was the radial most extensor tendon identified.  Modified Kessler repair was performed using 4-0 FiberWire.  Appropriate tendon reapproximation was appreciated with appropriate tensioning of the index finger.  EIP tendon repair was then performed as well  and modified Kessler fashion as well with 4-0 FiberWire.  5-0 Prolene suture was utilized for circumferential epitendinous repair as well on both the EDC and EIP tendons of the index finger.  Appropriate  excursion was appreciated.  Passive motion with full flexion of the digit was appreciated as well.  Once again, appropriate tensioning of the extensor apparatus was appreciated.  Tenodesis effect was checked and noted to be intact.  The tourniquet was deflated at 32 minutes.  Fingertips were pink with brisk capillary refill after deflation of tourniquet.  Copious irrigation was performed.  Skin closure was performed using 4-0 nylon in simple standard fashion.  Sterile dressings were applied utilizing Xeroform, 4 x 4's, gauze, web roll, plaster splints both volarly and dorsally and Ace wrap's.  The hand was splinted in the intrinsic plus position with index through small finger in full extension.  The operative drapes were broken down.  The patient was awoken from anesthesia safely and taken to PACU in stable condition.   Post-operative plan: The patient will recover in the post-anesthesia care unit and then be discharged home.  The patient will be non weight bearing on the right upper extremity in a short arm splint.   I will see the patient back in the office in  2 weeks  for postoperative followup.  Discharge instructions were provided for appropriate dressing care as well as pain medication.  Samuella Cota, MD Electronically signed, 07/01/23

## 2023-07-01 NOTE — Interval H&P Note (Addendum)
 History and Physical Interval Note:  07/01/2023 2:24 PM  Drew [name]  has presented today for surgery, with the diagnosis of RIGHT INDEX FINGER EXTENSOR TENDON LACERATION.  The various methods of treatment have been discussed with the patient and family. After consideration of risks, benefits and other options for treatment, the patient has consented to  Procedure(s): RIGHT INDEX FINGER REPAIR EXTENSOR TENDON (Right) as a surgical intervention.  The patient's history has been reviewed, patient examined, no change in status, stable for surgery.  I have reviewed the patient's chart and labs.  Questions were answered to the patient's satisfaction.     Garnet Overfield

## 2023-07-01 NOTE — Anesthesia Preprocedure Evaluation (Signed)
 Anesthesia Evaluation  Patient identified by MRN, date of birth, ID band Patient awake    Reviewed: Allergy & Precautions, H&P , NPO status , Patient's Chart, lab work & pertinent test results  Airway Mallampati: II   Neck ROM: full    Dental   Pulmonary neg pulmonary ROS   breath sounds clear to auscultation       Cardiovascular negative cardio ROS  Rhythm:regular Rate:Normal     Neuro/Psych  Headaches PSYCHIATRIC DISORDERS Anxiety Depression    CVA    GI/Hepatic   Endo/Other  diabetes, Type 2    Renal/GU      Musculoskeletal   Abdominal   Peds  Hematology   Anesthesia Other Findings   Reproductive/Obstetrics                             Anesthesia Physical Anesthesia Plan  ASA: 2  Anesthesia Plan: General   Post-op Pain Management:    Induction: Intravenous  PONV Risk Score and Plan: 2 and Ondansetron, Dexamethasone, Midazolam and Treatment may vary due to age or medical condition  Airway Management Planned: LMA  Additional Equipment:   Intra-op Plan:   Post-operative Plan: Extubation in OR  Informed Consent: I have reviewed the patients History and Physical, chart, labs and discussed the procedure including the risks, benefits and alternatives for the proposed anesthesia with the patient or authorized representative who has indicated his/her understanding and acceptance.     Dental advisory given  Plan Discussed with: CRNA, Anesthesiologist and Surgeon  Anesthesia Plan Comments:        Anesthesia Quick Evaluation

## 2023-07-01 NOTE — Anesthesia Procedure Notes (Signed)
 Procedure Name: LMA Insertion Date/Time: 07/01/2023 2:37 PM  Performed by: Jessica Priest, CRNAPre-anesthesia Checklist: Patient identified, Emergency Drugs available, Suction available, Patient being monitored and Timeout performed Patient Re-evaluated:Patient Re-evaluated prior to induction Oxygen Delivery Method: Circle system utilized Preoxygenation: Pre-oxygenation with 100% oxygen Induction Type: IV induction Ventilation: Mask ventilation without difficulty LMA: LMA inserted LMA Size: 5.0 Number of attempts: 1 Airway Equipment and Method: Bite block Placement Confirmation: positive ETCO2, breath sounds checked- equal and bilateral and CO2 detector Tube secured with: Tape Dental Injury: Teeth and Oropharynx as per pre-operative assessment

## 2023-07-01 NOTE — Transfer of Care (Signed)
 Immediate Anesthesia Transfer of Care Note  Patient: Drew [name]  Procedure(s) Performed: Procedure(s) (LRB): LEFT INDEX FINGER REPAIR EXTENSOR TENDON (Left)  Patient Location: PACU  Anesthesia Type: GA  Level of Consciousness: awake, sedated, patient cooperative and responds to stimulation, c/o pain in back - comfort measures given w/ medication   Airway & Oxygen Therapy: Patient Spontanous Breathing and Patient connected to [place] oxygen  Post-op Assessment: Report given to PACU RN, Post -op Vital signs reviewed and stable and Patient moving all extremities  Post vital signs: Reviewed and stable  Complications: No apparent anesthesia complications

## 2023-07-01 NOTE — Discharge Instructions (Addendum)
    Hand Surgery Postop Instructions   Dressings: Maintain postoperative dressing until orthopedic follow-up.  Keep operative site clean and dry until orthopedic follow-up.  Wound Care: Keep your hand elevated above the level of your heart.  Do not allow it to dangle by your side. Moving your fingers is advised to stimulate circulation but will depend on the site of your surgery.  If you have a splint applied, your doctor will advise you regarding movement.  Activity: Do not drive or operate machinery until clearance given from physician. No heavy lifting with operative extremity.  Diet:  Drink liquids today or eat a light diet.  You may resume a regular diet tomorrow.    General expectations: Take prescribed medication if given, transition to over-the-counter medication as quickly as possible. Fingers may become slightly swollen.  Call your doctor if any of the following occur: Severe pain not relieved by pain medication. Elevated temperature. Dressing soaked with blood. Inability to move fingers. White or bluish color to fingers.   Per [facility] clinic policy, our goal is ensure optimal postoperative pain control with a multimodal pain management strategy. For all [facility] patients, our goal is to wean post-operative narcotic medications by 6 weeks post-operatively. If this is not possible due to utilization of pain medication prior to surgery, your [facility] doctor will support your acute post-operative pain control for the first 6 weeks postoperatively, with a plan to transition you back to your primary pain team following that. Cyndia Skeeters will work to ensure a Therapist, occupational.  Anshul Trevor Mace, M.D. Hand Surgery [facility]    Post Anesthesia Home Care Instructions  Activity: Get plenty of rest for the remainder of the day. A responsible individual must stay with you for 24 hours following the procedure.  For the next 24 hours, DO NOT: -Drive a  car -Advertising copywriter -Drink alcoholic beverages -Take any medication unless instructed by your physician -Make any legal decisions or sign important papers.  Meals: Start with liquid foods such as gelatin or soup. Progress to regular foods as tolerated. Avoid greasy, spicy, heavy foods. If nausea and/or vomiting occur, drink only clear liquids until the nausea and/or vomiting subsides. Call your physician if vomiting continues.  Special Instructions/Symptoms: Your throat may feel dry or sore from the anesthesia or the breathing tube placed in your throat during surgery. If this causes discomfort, gargle with warm salt water. The discomfort should disappear within 24 hours.  If you had a scopolamine patch placed behind your ear for the management of post- operative nausea and/or vomiting:  1. The medication in the patch is effective for 72 hours, after which it should be removed.  Wrap patch in a tissue and discard in the trash. Wash hands thoroughly with soap and water. 2. You may remove the patch earlier than 72 hours if you experience unpleasant side effects which may include dry mouth, dizziness or visual disturbances. 3. Avoid touching the patch. Wash your hands with soap and water after contact with the patch.    Next dose of ibuprofen if needed is at 9:45PM

## 2023-07-02 ENCOUNTER — Encounter (HOSPITAL_BASED_OUTPATIENT_CLINIC_OR_DEPARTMENT_OTHER): Payer: Self-pay | Admitting: Orthopedic Surgery

## 2023-07-02 NOTE — Progress Notes (Signed)
 When pt's handoff was originally performed at 1426 on 07/01/2023 with Thedore Mins, RN, the computer posting was incorrect.  The computer posting stated Dr. Denese Killings was operating on the left index finger, when in fact he was operating on the right.  The paper consent was correct, Dr. Birdena Jubilee markings on the pt were correct, and our verbal time out was correct, it all stated "Right."  The pt also verbalized we were operating on the "Right" index finger.  RN unable to correct the handoff in epic because of read only status at this time.  RN completely safety zone.

## 2023-07-02 NOTE — Anesthesia Postprocedure Evaluation (Signed)
 Anesthesia Post Note  Patient: Drew [name]  Procedure(s) Performed: RIGHT INDEX FINGER REPAIR EXTENSOR TENDON (Right: Finger)     Patient location during evaluation: PACU Anesthesia Type: General Level of consciousness: awake and alert Pain management: pain level controlled Vital Signs Assessment: post-procedure vital signs reviewed and stable Respiratory status: spontaneous breathing, nonlabored ventilation, respiratory function stable and patient connected to nasal cannula oxygen Cardiovascular status: blood pressure returned to baseline and stable Postop Assessment: no apparent nausea or vomiting Anesthetic complications: no   No notable events documented.  Last Vitals:  Vitals:   07/01/23 1649 07/01/23 1721  BP: (!) 150/98 (!) 153/94  Pulse: (!) 57 (!) 58  Resp: 17 20  Temp:  (!) 36.3 C  SpO2: 95% 94%    Last Pain:  Vitals:   07/01/23 1721  TempSrc: Temporal  PainSc: 5    Pain Goal: Patients Stated Pain Goal: 3 (07/01/23 1151)                 Shelton Silvas

## 2023-07-09 ENCOUNTER — Encounter: Payer: Self-pay | Admitting: Nurse Practitioner

## 2023-07-09 ENCOUNTER — Other Ambulatory Visit: Payer: Self-pay

## 2023-07-09 ENCOUNTER — Ambulatory Visit (INDEPENDENT_AMBULATORY_CARE_PROVIDER_SITE_OTHER): Payer: Self-pay | Admitting: Nurse Practitioner

## 2023-07-09 VITALS — BP 124/94 | HR 62 | Temp 98.1°F | Wt 185.2 lb

## 2023-07-09 DIAGNOSIS — E119 Type 2 diabetes mellitus without complications: Secondary | ICD-10-CM

## 2023-07-09 DIAGNOSIS — S61401D Unspecified open wound of right hand, subsequent encounter: Secondary | ICD-10-CM

## 2023-07-09 DIAGNOSIS — G4700 Insomnia, unspecified: Secondary | ICD-10-CM

## 2023-07-09 DIAGNOSIS — S66821D Laceration of other specified muscles, fascia and tendons at wrist and hand level, right hand, subsequent encounter: Secondary | ICD-10-CM

## 2023-07-09 MED ORDER — HYDROXYZINE HCL 10 MG PO TABS
10.0000 mg | ORAL_TABLET | Freq: Three times a day (TID) | ORAL | 0 refills | Status: DC | PRN
  Filled 2023-07-09: qty 30, 10d supply, fill #0

## 2023-07-09 NOTE — Progress Notes (Signed)
 Subjective   Patient ID: Drew [name], male    DOB: [birth year], 48 y.o.   MRN: [id number]  Chief Complaint  Patient presents with   Hospitalization Follow-up    Referring provider: Ivonne Andrew, NP  Drew [name] is a 48 y.o. 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 He did have a laceration of his right hand and is scheduled to follow with ortho next week. He is doing well. He is itching under his cast. We will trial hydroxyzine for the itching. Denies f/c/s, n/v/d, hemoptysis, PND, leg swelling Denies chest pain or edema     No Known Allergies  Immunization History  Administered Date(s) Administered   Influenza, Seasonal, Injecte, Preservative Fre 06/12/2023   Influenza,inj,Quad PF,6+ Mos 06/06/2017, 03/06/2018, 03/28/2020, 04/03/2021, 04/25/2022   PFIZER(Purple Top)SARS-COV-2 Vaccination 07/29/2019, 08/19/2019   Pneumococcal Conjugate-13 06/29/2019   Pneumococcal Polysaccharide-23 06/06/2017   Tdap 02/20/2011, 06/06/2017    Tobacco History: Social History   Tobacco Use  Smoking Status Never  Smokeless Tobacco Never  Tobacco Comments   Pt states he smokes once or twice a month   Counseling given: Not Answered Tobacco comments: Pt states he smokes once or twice a month   Outpatient Encounter Medications as of 07/09/2023  Medication Sig   aspirin 81 MG chewable tablet Chew 81 mg by mouth daily.   atorvastatin (LIPITOR) 40 MG tablet Take 1 tablet (40 mg total) by mouth daily.   metFORMIN (GLUCOPHAGE-XR) 500 MG 24 hr tablet Take 1 tablet (500 mg total) by mouth 2 (two) times daily with a meal.   oxyCODONE (ROXICODONE) 5 MG immediate release tablet Take 1 tablet (5 mg total) by mouth every 6 (six) hours as needed for severe pain (pain score 7-10).   traZODone  (DESYREL) 50 MG tablet Take 0.5-1 tablets (25-50 mg total) by mouth at bedtime as needed for sleep.   cephALEXin (KEFLEX) 500 MG capsule Take 1 capsule (500 mg total) by mouth 4 (four) times daily. (Patient not taking: Reported on 07/09/2023)   escitalopram (LEXAPRO) 10 MG tablet Take 1 tablet (10 mg total) by mouth daily. (Patient not taking: Reported on 07/09/2023)   hydrOXYzine (ATARAX) 10 MG tablet Take 1 tablet (10 mg total) by mouth 3 (three) times daily as needed.   ibuprofen (ADVIL) 600 MG tablet Take 1 tablet (600 mg total) by mouth every 6 (six) hours as needed. (Patient not taking: Reported on 07/09/2023)   [DISCONTINUED] hydrOXYzine (ATARAX) 10 MG tablet Take 1 tablet (10 mg total) by mouth 3 (three) times daily as needed. (Patient not taking: Reported on 07/09/2023)   No facility-administered encounter medications on file as of 07/09/2023.    Review of Systems  Review of Systems  Constitutional: Negative.   HENT: Negative.    Cardiovascular: Negative.   Gastrointestinal: Negative.   Allergic/Immunologic: Negative.   Neurological: Negative.   Psychiatric/Behavioral: Negative.       Objective:   BP (!) 124/94   Pulse 62   Temp 98.1 F (36.7 C)   Wt 185 lb 3.2 oz (84 kg)   SpO2 100%   BMI 29.89 kg/m   Wt Readings from Last 5 Encounters:  07/09/23 185 lb 3.2 oz (84 kg)  07/01/23 185 lb 13.6 oz (84.3 kg)  06/12/23 187 lb  6.4 oz (85 kg)  01/27/23 179 lb (81.2 kg)  09/09/22 188 lb 6.4 oz (85.5 kg)     Physical Exam Vitals and nursing note reviewed.  Constitutional:      General: He is not in acute distress.    Appearance: He is well-developed.  Cardiovascular:     Rate and Rhythm: Normal rate and regular rhythm.  Pulmonary:     Effort: Pulmonary effort is normal.     Breath sounds: Normal breath sounds.  Musculoskeletal:     Comments: Wrap to right hand clean and inatact  Skin:    General: Skin is warm and dry.  Neurological:     Mental Status: He is alert  and oriented to person, place, and time.       Assessment & Plan:   Extensor tendon laceration of right hand with open wound, subsequent encounter  Diabetes mellitus without complication (HCC)  Insomnia, unspecified type -     hydrOXYzine HCl; Take 1 tablet (10 mg total) by mouth 3 (three) times daily as needed.  Dispense: 30 tablet; Refill: 0     No follow-ups on file.   Ivonne Andrew, NP 07/22/2023

## 2023-07-09 NOTE — Patient Instructions (Addendum)
 1. Extensor tendon laceration of right hand with open wound, subsequent encounter (Primary)   2. Diabetes mellitus without complication (HCC)   3. Insomnia, unspecified type  - hydrOXYzine (ATARAX) 10 MG tablet; Take 1 tablet (10 mg total) by mouth 3 (three) times daily as needed.  Dispense: 30 tablet; Refill: 0  Follow up:  Follow up in 3 months

## 2023-07-12 NOTE — Progress Notes (Unsigned)
 Marland Kitchen

## 2023-07-14 ENCOUNTER — Other Ambulatory Visit: Payer: Self-pay

## 2023-07-14 ENCOUNTER — Encounter: Payer: Self-pay | Admitting: Orthopedic Surgery

## 2023-07-16 ENCOUNTER — Encounter: Payer: Self-pay | Admitting: Orthopedic Surgery

## 2023-07-17 ENCOUNTER — Ambulatory Visit (INDEPENDENT_AMBULATORY_CARE_PROVIDER_SITE_OTHER): Payer: Self-pay | Admitting: Orthopedic Surgery

## 2023-07-17 DIAGNOSIS — S66321A Laceration of extensor muscle, fascia and tendon of left index finger at wrist and hand level, initial encounter: Secondary | ICD-10-CM

## 2023-07-17 NOTE — Addendum Note (Signed)
 Addended by: Glynn Octave C on: 07/17/2023 09:09 AM   Modules accepted: Orders

## 2023-07-17 NOTE — Progress Notes (Signed)
   Drew [name] - 48 y.o. male MRN [id number]  Date of birth: 30-Sep-1975  Office Visit Note: Visit Date: 07/17/2023 PCP: Ivonne Andrew, NP Referred by: Ivonne Andrew, NP  Subjective:  HPI: Drew [name] is a 48 y.o. male who presents today for follow up 2 weeks status post right index finger extensor tendon repair zone 5 of both the EIP and EDC tendons.  Pertinent ROS were reviewed with the patient and found to be negative unless otherwise specified above in HPI.   Assessment & Plan: Visit Diagnoses:  1. Laceration of extensor muscle, fascia and tendon of left index finger at wrist and hand level, initial encounter     Plan: Sutures removed today without incident.  Transition to a short arm cast today for additional 2 weeks, wrist at 30 degrees extension with MPs neutral.  At his next visit, we will arrange for occupational therapy for fabrication of an orthosis and to begin active range of motion of wrist and MPs with EDC glides.  Follow-up: No follow-ups on file.   Meds & Orders: No orders of the defined types were placed in this encounter.  No orders of the defined types were placed in this encounter.    Procedures: No procedures performed       Objective:   Vital Signs: There were no vitals taken for this visit.  Ortho Exam Right hand: - Well-healing dorsal incision, sutures removed today, no erythema or drainage - MP joints in neutral position - Hand remains warm well-perfused, sensation intact distally  Imaging: No results found.   Rosha Cocker Trevor Mace, M.D. [facility], Hand Surgery

## 2023-07-22 ENCOUNTER — Encounter: Payer: Self-pay | Admitting: Nurse Practitioner

## 2023-07-28 ENCOUNTER — Ambulatory Visit: Payer: Self-pay | Admitting: Nurse Practitioner

## 2023-07-31 NOTE — Therapy (Addendum)
 OUTPATIENT OCCUPATIONAL THERAPY ORTHO EVALUATION  Patient Name: Drew [name] MRN: [id number] DOB:[birth year], 48 y.o., male Today's Date: 08/04/2023  PCP: Luellen Sages NP REFERRING PROVIDER: Merrill Abide, MD            OCCUPATIONAL THERAPY DISCHARGE SUMMARY  Visits from Start of Care: 1  Patient did not return for additional therapy after this initial evaluation.  Goals and issues could not be addressed, and this plan of care will be discharged.  He has a new order for therapy and will be coming back in tomorrow, we will reference these assessments but begin a new episode of care.  Leartis Proud, OTR/L, CHT 09/04/23          END OF SESSION:  OT End of Session - 08/04/23 1134     Visit Number 1    Number of Visits 12    Date for OT Re-Evaluation 09/19/23    Authorization Type N/A    OT Start Time 1133    OT Stop Time 1231    OT Time Calculation (min) 58 min    Equipment Utilized During Treatment orthotic materials    Activity Tolerance Patient tolerated treatment well;No increased pain;Patient limited by fatigue;Patient limited by pain    Behavior During Therapy [facility] for tasks assessed/performed;Anxious             Past Medical History:  Diagnosis Date   Anxiety    Depression    Facial nerve palsy    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   Type 2 diabetes mellitus (HCC)    Past Surgical History:  Procedure Laterality Date   APPENDECTOMY     REPAIR EXTENSOR TENDON Right 07/01/2023   Procedure: RIGHT INDEX FINGER REPAIR EXTENSOR TENDON;  Surgeon: Merrill Abide, MD;  Location: [facility];  Service: Orthopedics;  Laterality: Right;   Patient Active Problem List   Diagnosis Date Noted   Extensor tendon laceration of right hand with open wound 07/01/2023   Mass of arm, left 09/09/2022   Left elbow pain 08/27/2021   HLD (hyperlipidemia) 12/18/2017   Diabetes mellitus without complication (HCC) 12/18/2017   TIA (transient ischemic  attack) 12/18/2017   Right-sided muscle weakness 05/08/2017   Right sided numbness 05/08/2017   Headache 05/08/2017   Stroke-like symptoms 05/08/2017   Non-English speaking patient 05/08/2017   Weakness 05/07/2017    ONSET DATE: DOS: 07/01/23  REFERRING DIAG: J62.831D (ICD-10-CM) - Laceration of extensor muscle, fascia and tendon of left index finger at wrist and hand level, initial encounter   THERAPY DIAG:  Stiffness of right hand, not elsewhere classified  Laceration of extensor muscle, fascia and tendon of left index finger at wrist and hand level, initial encounter  Pain in right hand  Other lack of coordination  Muscle weakness (generalized)  Rationale for Evaluation and Treatment: Rehabilitation  SUBJECTIVE:   SUBJECTIVE STATEMENT: ~5 weeks s/p Lt IF EIP, EDC zone 5 repairs.   He is Spanish speaking, with interpreter and his spouse today (she speaks a fair bit of English as well as [place]). He states not having significant pain, but having mild pain and mostly stiffness.  This was a work accident, and he states having severe issues while he is unable to use his right dominant hand and arm.    PERTINENT HISTORY: *SELF PAY* Spanish speaking Splint fabrication in 2 weeks.  *will need to be told self pay price for splint*  PRECAUTIONS: Right hand surgical site and not weightbearing  RED FLAGS: None  WEIGHT BEARING RESTRICTIONS: Yes nonweightbearing right hand and arm  PAIN:  Are you having pain? Yes: NPRS scale: 1-2/10 at rest now  Pain location: Right index finger surgical site Pain description: Aching/sore Aggravating factors: N/A Relieving factors: Rest  FALLS: Has patient fallen in last 6 months? No  LIVING ENVIRONMENT: Lives with: lives with their family Lives in: House/apartment Has following equipment at home: None  PLOF: Independent  PATIENT GOALS: To safely improve the use of right dominant hand and arm  NEXT MD VISIT: 08/26/2023  OBJECTIVE:  (All objective assessments below are from initial evaluation on: 08/04/2023 unless otherwise specified.)    HAND DOMINANCE: Right   ADLs: Overall ADLs: States decreased ability to grab, hold household objects, pain and difficulty to open containers, perform FMS tasks (manipulate fasteners on clothing), mild to moderate bathing problems as well.    FUNCTIONAL OUTCOME MEASURES: Eval: TBD if/when time allows    UPPER EXTREMITY ROM     Shoulder to Wrist AROM Right eval  Forearm supination WFL  Forearm pronation  WFL  Wrist flexion 36  Wrist extension 32  Wrist ulnar deviation   Wrist radial deviation   Functional dart thrower's motion (F-DTM) in ulnar flexion   F-DTM in radial extension    (Blank rows = not tested)   Hand AROM Right eval  Full Fist Ability (or Gap to Distal Palmar Crease) Unable, all fingers stiff and fairly immobile today   Thumb Opposition  (Kapandji Scale)  2/10  Thumb MCP (0-60)   Thumb IP (0-80)   Thumb Radial Abduction Span   Thumb Palmar Abduction Span   Index MCP (0-90) (-22) - 32  Index PIP (0-100) (-14) - 25  Index DIP (0-70) (-11) -  14  Long MCP (0-90)    Long PIP (0-100)    Long DIP (0-70)    (Blank rows = not tested)   HAND FUNCTION: Eval: Observed weakness in affected right hand.  Details to be determined when safe and appropriate Grip strength Right: TBD lbs, Left: TBD lbs   COORDINATION: Eval: Observed coordination impairments with affected right hand.  Details to be determined when appropriate and able 9 Hole Peg Test Right: TBD sec, Left: TBD sec (TBD sec is WFL)   SENSATION: Eval:  Light touch intact today, though somewhat diminished around sx area    EDEMA:   Eval: Moderately swollen in right hand and wrist today, detailed measures to be determined in next session  COGNITION: Eval: Overall cognitive status: WFL for evaluation today   OBSERVATIONS:   Eval: Surgical site is not completely healed yet, sutures removed  between 1 and 2 weeks ago.  Some apparent eschar, moderate swelling, tenderness to touch.  All fingers are stiff especially index and middle.  Apparent extension lag   TODAY'S TREATMENT:  Post-evaluation treatment:    Custom orthotic fabrication was indicated due to pt's right hand index finger EDC/EIP lacerations and repairs and need for safe, functional positioning. OT fabricated custom forearm-based index finger and middle finger full extension orthosis (due to extension lag ) for pt today to protect his healing surgical site. It fit well with no areas of pressure, pt states a comfortable fit. Pt was educated on the wearing schedule (on at all times except for hygiene and exercises), to avoid exposing it to sources of heat, to wipe clean as needed (do not wash, use harsh detergents), to call or come in ASAP if it is causing any irritation or is not achieving desired  function. It will be checked/adjusted in upcoming sessions, as needed. Pt states understanding all directions.    Additionally, OT educates on wound care, and also dresses the wound with Xeroform and clean roll gauze securing with a piece of tape.  He is also given a compressive finger sock as well as compressive Tubigrip to wear for the back of his hand and wrist, to help with swelling and edema control.  He is given some materials to use at home as well.  He was given self-care/safety information to do no gripping pushing or pulling with the right hand now or until told to by the surgeon or the therapist.  Nonweightbearing.  He should wash his hand at least once a day to replace dressings and keep clean and covered until surgical site is well-healed.  He should rub around his scar area gently to shift his layers of skin 3-4 times a day for 2 to 3 minutes at a time.  He should also perform the following home exercise program (this program included several modifications for safety that were discussed and demonstrated with the patient  face-to-face), 4-6 times a day as tolerated without pain.  When demonstrating back today he had a lot of swelling and stiffness and very little motion, but also very little pain which is a silver lining.   Exercises - Bend and Pull Back Wrist SLOWLY  - 4 x daily - 10-15 reps - Finger Spreading  - 4-6 x daily - 10-15 reps - Half Fist AROM  - 3-4 x daily - 10 reps - Finger Extension or EDC Isometric: Palm Up  - 4-6 x daily - 10-15 reps - Tendon Glides  - 4-6 x daily - 3-5 reps - 2-3 seconds hold - Seated Single Finger Extension  - 4-6 x daily - 10-15 reps  + scar massage 3-4 x day    PATIENT EDUCATION: Education details: See tx section above for details  Person educated: Patient Education method: Verbal Instruction, Teach back, Handouts  Education comprehension: States and demonstrates understanding, Additional Education required    HOME EXERCISE PROGRAM: Access Code: [email or web address] URL: [email or web address] Date: 08/04/2023 Prepared by: Leartis Proud   GOALS: Goals reviewed with patient? Yes   SHORT TERM GOALS: (STG required if POC>30 days) Target Date: 08/22/2023  Pt will obtain protective, custom orthotic. Goal status: 08/04/2023: MET   2.  Pt will demo/state understanding of initial HEP to improve pain levels and prerequisite motion. Goal status: INITIAL   LONG TERM GOALS: Target Date: 09/19/2023  Pt will improve functional ability by decreased impairment per functional standardized assessment from TBD to TBD or better, for better quality of life. Goal status: INITIAL  2.  Pt will improve grip strength in right hand from unsafe to test to at least 45 lbs for functional use at home and in IADLs. Goal status: INITIAL  3.  Pt will improve A/ROM in right index finger total active motion from 24 degrees to at least 180 degrees, to have functional motion for tasks like reach and grasp.  Goal status: INITIAL  4.  Pt will improve strength in right wrist from  apparent 3 -/5 MMT to at least 4+/5 MMT to have increased functional ability to carry out selfcare and higher-level homecare tasks with less difficulty. Goal status: INITIAL  5.  Pt will improve coordination skills in right hand and arm, as seen by within functional limit score on nine-hole peg testing to have increased functional ability to carry out fine motor tasks (  fasteners, etc.) and more complex, coordinated IADLs (meal prep, sports, etc.).  Goal status: INITIAL    ASSESSMENT:  CLINICAL IMPRESSION: Patient is a 48 y.o. male who was seen today for occupational therapy evaluation for laceration of right extensor indices proprius and extensor digitorum communis with subsequent surgical repair and immobilization with lingering weakness, stiffness, decreased range of motion and decreased functional ability.  He will benefit from outpatient occupational therapy to decrease symptoms and increase functional ability and quality of life.   PERFORMANCE DEFICITS: in functional skills including ADLs, IADLs, coordination, dexterity, proprioception, sensation, edema, tone, ROM, strength, pain, fascial restrictions, flexibility, Fine motor control, body mechanics, endurance, decreased knowledge of precautions, wound, and UE functional use, cognitive skills including problem solving and safety awareness, and psychosocial skills including coping strategies, environmental adaptation, and habits.   IMPAIRMENTS: are limiting patient from ADLs, IADLs, work, and leisure.   COMORBIDITIES: may have co-morbidities  that affects occupational performance. Patient will benefit from skilled OT to address above impairments and improve overall function.  MODIFICATION OR ASSISTANCE TO COMPLETE EVALUATION: No modification of tasks or assist necessary to complete an evaluation.  OT OCCUPATIONAL PROFILE AND HISTORY: Problem focused assessment: Including review of records relating to presenting problem.  CLINICAL DECISION  MAKING: Moderate - several treatment options, min-mod task modification necessary  REHAB POTENTIAL: Good  EVALUATION COMPLEXITY: Low      PLAN:  OT FREQUENCY: 1-2x/week  OT DURATION: 6 weeks through 09/19/2023 and up to 12 total visits as needed  PLANNED INTERVENTIONS: 97168 OT Re-evaluation, 97535 self care/ADL training, 64403 therapeutic exercise, 97530 therapeutic activity, 97112 neuromuscular re-education, 97140 manual therapy, 97035 ultrasound, 97039 fluidotherapy, 97010 moist heat, 97010 cryotherapy, 97034 contrast bath, 97032 electrical stimulation (manual), 97760 Orthotics management and training, 47425 Splinting (initial encounter), [id number] Subsequent splinting/medication, scar mobilization, passive range of motion, compression bandaging, Dry needling, coping strategies training, patient/family education, and DME and/or AE instructions  RECOMMENDED OTHER SERVICES: None now  CONSULTED AND AGREED WITH PLAN OF CARE: Patient and family member/caregiver  PLAN FOR NEXT SESSION:  As soon as extension lag has improved, trim down to a MP blocking orthosis only.  Upgrade to gentle passive stretches and joint blocking techniques as tolerated and when appropriate (check protocols)   Leartis Proud, OTR/L, CHT 08/04/2023, 12:55 PM

## 2023-08-04 ENCOUNTER — Ambulatory Visit (INDEPENDENT_AMBULATORY_CARE_PROVIDER_SITE_OTHER): Payer: Self-pay | Admitting: Rehabilitative and Restorative Service Providers"

## 2023-08-04 ENCOUNTER — Ambulatory Visit (INDEPENDENT_AMBULATORY_CARE_PROVIDER_SITE_OTHER): Payer: Self-pay | Admitting: Orthopedic Surgery

## 2023-08-04 ENCOUNTER — Encounter: Payer: Self-pay | Admitting: Rehabilitative and Restorative Service Providers"

## 2023-08-04 DIAGNOSIS — S66821D Laceration of other specified muscles, fascia and tendons at wrist and hand level, right hand, subsequent encounter: Secondary | ICD-10-CM

## 2023-08-04 DIAGNOSIS — S61401D Unspecified open wound of right hand, subsequent encounter: Secondary | ICD-10-CM

## 2023-08-04 DIAGNOSIS — M6281 Muscle weakness (generalized): Secondary | ICD-10-CM

## 2023-08-04 DIAGNOSIS — S66321A Laceration of extensor muscle, fascia and tendon of left index finger at wrist and hand level, initial encounter: Secondary | ICD-10-CM

## 2023-08-04 DIAGNOSIS — R278 Other lack of coordination: Secondary | ICD-10-CM

## 2023-08-04 DIAGNOSIS — M79641 Pain in right hand: Secondary | ICD-10-CM

## 2023-08-04 DIAGNOSIS — M25641 Stiffness of right hand, not elsewhere classified: Secondary | ICD-10-CM

## 2023-08-04 NOTE — Progress Notes (Signed)
   Drew [name] - 48 y.o. male MRN [id number]  Date of birth: 1975-10-30  Office Visit Note: Visit Date: 08/04/2023 PCP: Ivonne Andrew, NP Referred by: Ivonne Andrew, NP  Subjective:  HPI: Drew [name] is a 48 y.o. male who presents today for follow up 5 weeks status post right index finger repair extensor tendon.  He has been compliant to date, pain controlled.  Pertinent ROS were reviewed with the patient and found to be negative unless otherwise specified above in HPI.   Assessment & Plan: Visit Diagnoses:  1. Extensor tendon laceration of right hand with open wound, subsequent encounter     Plan: He is doing well postoperatively, cast removed today.  Occupational Therapy will see him today for fabrication of an orthosis and begin range of motion exercises of the index finger.  Will wait until week 8 to begin strengthening per protocol.  Return to me in 3 weeks for repeat check of range of motion prior to beginning strengthening.  Follow-up: No follow-ups on file.   Meds & Orders: No orders of the defined types were placed in this encounter.  No orders of the defined types were placed in this encounter.    Procedures: No procedures performed       Objective:   Vital Signs: There were no vitals taken for this visit.  Ortho Exam Right hand: - Well-healed dorsal incision over the index finger MCP region - Able to perform gentle extension of the index finger, limited secondary to immobilization - Sensation intact distally in all distributions - Hand remains warm well-perfused  Imaging: No results found.   [name] Trevor Mace, M.D. [facility], Hand Surgery

## 2023-08-15 ENCOUNTER — Other Ambulatory Visit: Payer: Self-pay

## 2023-08-26 ENCOUNTER — Ambulatory Visit: Payer: Self-pay | Admitting: Orthopedic Surgery

## 2023-08-26 DIAGNOSIS — S66321A Laceration of extensor muscle, fascia and tendon of left index finger at wrist and hand level, initial encounter: Secondary | ICD-10-CM

## 2023-08-26 NOTE — Progress Notes (Signed)
   Leary Morrisette - 48 y.o. male MRN [id number]  Date of birth: 01-30-1976  Office Visit Note: Visit Date: 08/26/2023 PCP: Jerrlyn Morel, NP Referred by: Jerrlyn Morel, NP  Subjective:  HPI: Comer Devins is a 48 y.o. male who presents today for follow up 8 weeks status post right index finger extensor tendon repair, zone 5.  He was only able to see occupational therapy once for the arthrosis, due to insurance limitations and scheduling restrictions, has not been able to attend more therapy at this juncture.  Has been doing some home exercises with range of motion of the digit.  Pertinent ROS were reviewed with the patient and found to be negative unless otherwise specified above in HPI.   Assessment & Plan: Visit Diagnoses:  1. Laceration of extensor muscle, fascia and tendon of left index finger at wrist and hand level, initial encounter     Plan: Despite not doing formalized occupational therapy, he is doing quite well overall.  I did recommend that he do more aggressive flexion exercises of the digit at this point, in order to help regain a composite fist.  His extension is excellent of the index finger today, there is no significant extensor lag seen.  Repeat referral for occupational therapy was once again placed today to see if this may help facilitate at least a visit or 2 with transition to home exercise program.  I did emphasize the importance of range of motion currently in order to help prevent adhesions.  He expressed understanding.  Will return in 1 month for recheck of range of motion.  Follow-up: No follow-ups on file.   Meds & Orders: No orders of the defined types were placed in this encounter.   Orders Placed This Encounter  Procedures   Ambulatory referral to Occupational Therapy     Procedures: No procedures performed       Objective:   Vital Signs: There were no vitals taken for this visit.  Ortho Exam Right hand: - Well-healed incisional sites, no erythema  or drainage - Able to perform extension of the index finger without discernible extensor lag, able to perform extension against gentle resistance today of the index finger - With attempted composite fist, index fingers approximately 2 cm from distal palmar crease, improved passively - Sensation intact median/radial/ulnar distributions, AIN/PIN/interosseous intact  Imaging: No results found.   Earon Rivest Alvia Jointer, M.D. [facility], Hand Surgery

## 2023-09-05 ENCOUNTER — Encounter: Payer: Self-pay | Admitting: Rehabilitative and Restorative Service Providers"

## 2023-09-10 ENCOUNTER — Other Ambulatory Visit: Payer: Self-pay

## 2023-09-10 ENCOUNTER — Encounter: Payer: Self-pay | Admitting: Nurse Practitioner

## 2023-09-10 ENCOUNTER — Ambulatory Visit (INDEPENDENT_AMBULATORY_CARE_PROVIDER_SITE_OTHER): Payer: Self-pay | Admitting: Nurse Practitioner

## 2023-09-10 DIAGNOSIS — G4700 Insomnia, unspecified: Secondary | ICD-10-CM

## 2023-09-10 DIAGNOSIS — E119 Type 2 diabetes mellitus without complications: Secondary | ICD-10-CM

## 2023-09-10 DIAGNOSIS — E785 Hyperlipidemia, unspecified: Secondary | ICD-10-CM

## 2023-09-10 LAB — POCT GLYCOSYLATED HEMOGLOBIN (HGB A1C): Hemoglobin A1C: 6.7 % — AB (ref 4.0–5.6)

## 2023-09-10 MED ORDER — ATORVASTATIN CALCIUM 40 MG PO TABS
40.0000 mg | ORAL_TABLET | Freq: Every day | ORAL | 2 refills | Status: DC
Start: 2023-09-10 — End: 2023-12-10
  Filled 2023-09-10: qty 90, 90d supply, fill #0

## 2023-09-10 MED ORDER — METFORMIN HCL ER 500 MG PO TB24
500.0000 mg | ORAL_TABLET | Freq: Two times a day (BID) | ORAL | 2 refills | Status: DC
  Filled 2023-09-10: qty 180, 90d supply, fill #0

## 2023-09-10 MED ORDER — CETIRIZINE HCL 10 MG PO TABS
10.0000 mg | ORAL_TABLET | Freq: Every day | ORAL | 11 refills | Status: DC
  Filled 2023-09-10: qty 30, 30d supply, fill #0
  Filled 2023-10-07 (×2): qty 30, 30d supply, fill #1

## 2023-09-10 MED ORDER — FLUTICASONE PROPIONATE 50 MCG/ACT NA SUSP
2.0000 | Freq: Every day | NASAL | 6 refills | Status: DC
  Filled 2023-09-10: qty 16, 25d supply, fill #0
  Filled 2023-10-07 (×2): qty 16, 25d supply, fill #1
  Filled 2024-01-14: qty 16, 30d supply, fill #2
  Filled 2024-03-05 (×2): qty 16, 30d supply, fill #3
  Filled 2024-04-25: qty 16, 30d supply, fill #4

## 2023-09-10 MED ORDER — TRAZODONE HCL 50 MG PO TABS
25.0000 mg | ORAL_TABLET | Freq: Every evening | ORAL | 2 refills | Status: DC | PRN
  Filled 2023-09-10 – 2023-10-13 (×2): qty 90, 90d supply, fill #0

## 2023-09-10 NOTE — Therapy (Signed)
 OUTPATIENT OCCUPATIONAL THERAPY ORTHO EVALUATION  Patient Name: Drew [name] MRN: [id number] DOB:Jun 09, 1975, 48 y.o., male Today's Date: 09/12/2023  PCP: Luellen Sages NP REFERRING PROVIDER: Merrill Abide, MD   END OF SESSION:  OT End of Session - 09/12/23 0930     Visit Number 1    Number of Visits 1    Authorization Type N/A    OT Start Time 0930    OT Stop Time 1008    OT Time Calculation (min) 38 min    Activity Tolerance Patient tolerated treatment well;Patient limited by fatigue;Patient limited by pain    Behavior During Therapy WFL for tasks assessed/performed             Past Medical History:  Diagnosis Date   Anxiety    Depression    Facial nerve palsy    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   Type 2 diabetes mellitus (HCC)    Past Surgical History:  Procedure Laterality Date   APPENDECTOMY     REPAIR EXTENSOR TENDON Right 07/01/2023   Procedure: RIGHT INDEX FINGER REPAIR EXTENSOR TENDON;  Surgeon: Merrill Abide, MD;  Location: [facility];  Service: Orthopedics;  Laterality: Right;   Patient Active Problem List   Diagnosis Date Noted   Extensor tendon laceration of right hand with open wound 07/01/2023   Mass of arm, left 09/09/2022   Left elbow pain 08/27/2021   HLD (hyperlipidemia) 12/18/2017   Diabetes mellitus without complication (HCC) 12/18/2017   TIA (transient ischemic attack) 12/18/2017   Right-sided muscle weakness 05/08/2017   Right sided numbness 05/08/2017   Headache 05/08/2017   Stroke-like symptoms 05/08/2017   Non-English speaking patient 05/08/2017   Weakness 05/07/2017    ONSET DATE: DOS: 07/01/23  REFERRING DIAG: W09.811B (ICD-10-CM) - Laceration of extensor muscle, fascia and tendon of left index finger at wrist and hand level, initial encounter   THERAPY DIAG:  Muscle weakness (generalized)  Other lack of coordination  Pain in right hand  Stiffness of right hand, not elsewhere classified  Rationale for  Evaluation and Treatment: Rehabilitation  SUBJECTIVE:   SUBJECTIVE STATEMENT: ~9 weeks s/p Lt IF EIP, EDC zone 5 repairs.   He is Spanish speaking, with interpreter today.  He states he has not been wearing any kind of brace during the day for about a month now, has been working, does not have much pain unless stretching or gripping very hard.  His main concern is that he cannot make a full fist now to hold a spoon or to write well.  He does state that he still has insurance limitations and problems with his work so he will probably only come for this evaluation and treatment and try to manage these on his own, with therapists directed home exercise program.    PERTINENT HISTORY: *SELF PAY* Spanish speaking, He attended therapy 1 visit and did not return due to insurance/payment issues.  He returns today    PRECAUTIONS: Right hand surgical site and caution with weightbearing  RED FLAGS: None   WEIGHT BEARING RESTRICTIONS: Weightbearing as tolerated is acceptable now but less than 30 pounds is recommended.  PAIN:  Are you having pain?  No pain at rest, pain goes to 3-4/10 with strong gripping and stretches.  FALLS: Has patient fallen in last 6 months? No  LIVING ENVIRONMENT: Lives with: lives with their family Lives in: House/apartment Has following equipment at home: None  PLOF: Independent  PATIENT GOALS: To safely improve the use of right  dominant hand and arm  NEXT MD VISIT: As needed  OBJECTIVE: (All objective assessments below are from initial evaluation on: 09/05/2023 unless otherwise specified.)    HAND DOMINANCE: Right   ADLs: Overall ADLs: States decreased ability to grab, hold household objects and work objects, difficult to hold a spoon or hold a utensil for writing.    UPPER EXTREMITY ROM     Shoulder to Wrist AROM Right 08/04/23 Rt Eval 09/12/2023  Forearm supination WFL   Forearm pronation  WFL   Wrist flexion 36 45  Wrist extension 32 45  Wrist ulnar  deviation    Wrist radial deviation    Functional dart thrower's motion (F-DTM) in ulnar flexion    F-DTM in radial extension     (Blank rows = not tested)   Hand AROM Right 08/04/23 Rt Eval 09/12/2023  Full Fist Ability (or Gap to Distal Palmar Crease) Unable, all fingers stiff and fairly immobile today  3.3cm gap from tip of IF to full fist   Thumb Opposition  (Kapandji Scale)  2/10 8/10  Thumb MCP (0-60)    Thumb IP (0-80)    Thumb Radial Abduction Span    Thumb Palmar Abduction Span    Index MCP (0-90) (-22) - 32 (-13) - 53  Index PIP (0-100) (-14) - 25 (-7) - 87  Index DIP (0-70) (-11) -  14 0 - 57  Long MCP (0-90)     Long PIP (0-100)     Long DIP (0-70)     (Blank rows = not tested)   HAND FUNCTION: 09/12/2023 eval: Grip strength Right: 26 lbs, Left: 74 lbs   COORDINATION: Eval: Observed coordination impairments with affected right hand, expected to improve with home exercise program and recommendations  SENSATION: Eval:   Light touch intact today, though somewhat diminished around sx area    EDEMA:   Eval: Only very mildly swollen in right hand today near surgical site  COGNITION: Eval: Overall cognitive status: WFL for evaluation today   OBSERVATIONS:   Eval: Surgical site is completely healed now, he is largely nontender to palpation, his extension is very good, but has limitations in flexion mainly at the MCP joint with some overt scar adherence.  TODAY'S TREATMENT:   Post-evaluation treatment:   For safety/self-care he was recommended to do no heavy or painful lifting likely not more than 30 pounds or so, and he should be careful for the next 3 weeks till at least 12 weeks postop.  He has been working without any type of brace, so the likelihood of him hurting himself at this point is low he would be doing something that is painful to him repetitively.  He states understanding of that.  Next, he is given the following home exercise program to perform 3-4 times  a day as he can, strong and painful.  He should start with this wrist, work down to the finger for flexion, perform extension as necessary to help with that.  He states still having his extension orthosis that he could wear at night to help with extension if needed.  He was also supplied with medical tape and Coban today and shown how to perform low load long duration stretches for MTP finger flexion and composite finger flexion as helpful to him.  He could even wear medical tape loosely for a light stretch overnight if tolerated.  He was made aware that it when he wakes up he should take the tape off carefully, and he will  feel stiff, but he should warm up his hand in the hot sink and get it moving again in the morning.  He states understanding of this plan including light strengthening with therapy putty activities as well.  He had no significant pain while performing these back in therapy today, and states he is fine to discharge therapy at this point to work on this himself.   Exercises - Wrist Flexion Stretch  - 4 x daily - 3-5 reps - 15 sec hold - Wrist Prayer Stretch  - 4 x daily - 3-5 reps - 15 sec hold - Tendon Glides  - 4-6 x daily - 3-5 reps - 2-3 seconds hold - BACK KNUCKLE STRETCHES   - 4 x daily - 3-5 reps - 15 sec hold - HOOK Stretch  - 4 x daily - 3-5 reps - 15-20 sec hold - Seated Finger Composite Flexion Stretch  - 4 x daily - 3-5 reps - 15 hold - PUSH KNUCKLES DOWN  - 4 x daily - 3-5 reps - 15 seconds hold - Seated Claw Fist with Putty  - 2-3 x daily - 5 reps - "Duck Mouth" Strength  - 2-3 x daily - 5 reps - Full Fist  - 2-3 x daily - 5 reps   PATIENT EDUCATION: Education details: See tx section above for details  Person educated: Patient Education method: Verbal Instruction, Teach back, Handouts  Education comprehension: States and demonstrates understanding   HOME EXERCISE PROGRAM: Access Code: [email or web address] URL: [email or web address] Date: 09/12/2023 Prepared  by: Leartis Proud   GOALS: Goals reviewed with patient? Yes   SHORT TERM GOALS: (STG required if POC>30 days) Target Date: 09/12/2023  1.  Pt will demo/state understanding of initial HEP to improve pain levels and prerequisite motion. Goal status: Goal met     ASSESSMENT:  CLINICAL IMPRESSION: Patient is a 48 y.o. male who was seen today for occupational therapy evaluation for laceration of right extensor indices proprius and extensor digitorum communis with subsequent surgical repair and immobilization with lingering weakness, stiffness, decreased range of motion and decreased functional ability.  He will benefit from outpatient occupational therapy to decrease symptoms and increase functional ability and quality of life.  He decided that he wanted to have only 1 visit today to get a home exercise program and the best strategies to improve his mobility and strength, and he feels like he will do these things on his own and manage on his own going forward.  He is discharged today.  PERFORMANCE DEFICITS: in functional skills including ADLs, IADLs, coordination, dexterity, proprioception, sensation, edema, tone, ROM, strength, pain, fascial restrictions, flexibility, Fine motor control, body mechanics, endurance, decreased knowledge of precautions, wound, and UE functional use, cognitive skills including problem solving and safety awareness, and psychosocial skills including coping strategies, environmental adaptation, and habits.   IMPAIRMENTS: are limiting patient from ADLs, IADLs, work, and leisure.   COMORBIDITIES: may have co-morbidities  that affects occupational performance. Patient will benefit from skilled OT to address above impairments and improve overall function.  MODIFICATION OR ASSISTANCE TO COMPLETE EVALUATION: No modification of tasks or assist necessary to complete an evaluation.  OT OCCUPATIONAL PROFILE AND HISTORY: Problem focused assessment: Including review of records  relating to presenting problem.  CLINICAL DECISION MAKING: Moderate - several treatment options, min-mod task modification necessary  REHAB POTENTIAL: Good  EVALUATION COMPLEXITY: Low      PLAN:  OT FREQUENCY: One-time visit  OT DURATION: One-time visit  PLANNED INTERVENTIONS: 86578 OT Re-evaluation, [phone]  self care/ADL training, 16109 therapeutic exercise, 97530 therapeutic activity, 97112 neuromuscular re-education, 97140 manual therapy, 97035 ultrasound, 97039 fluidotherapy, 97010 moist heat, 97010 cryotherapy, 97034 contrast bath, 97032 electrical stimulation (manual), 97760 Orthotics management and training, 60454 Splinting (initial encounter), [phone] Subsequent splinting/medication, scar mobilization, passive range of motion, compression bandaging, Dry needling, coping strategies training, patient/family education, and DME and/or AE instructions  RECOMMENDED OTHER SERVICES: None now  CONSULTED AND AGREED WITH PLAN OF CARE: Patient and family member/caregiver  PLAN FOR NEXT SESSION:  N/A/discharge   Leartis Proud, OTR/L, CHT 09/12/2023, 10:36 AM

## 2023-09-10 NOTE — Patient Instructions (Signed)
 1. Diabetes mellitus without complication (HCC)  - Urine Albumin/Creatinine with ratio (send out) [LAB689] - POCT glycosylated hemoglobin (Hb A1C)

## 2023-09-10 NOTE — Progress Notes (Signed)
 Subjective   Patient ID: Drew [name], male    DOB: Sep 29, 1975, 48 y.o.   MRN: [id number]  Chief Complaint  Patient presents with   Diabetes    Follow up    Referring provider: Jerrlyn Morel, NP  Mckinney Poplar is a 49 y.o. male with Past Medical History: No date: Anxiety No date: Depression No date: Facial nerve palsy No date: Stroke [facility])     Comment:  2020-balance issures No date: Type 2 diabetes mellitus (HCC)  HPI  Drew [name] is a 48 y.o. male who presents for follow-up of Type 2 diabetes mellitus.   Patient is checking home blood sugars.   Home blood sugar records: BGs have been labile ranging higher than usual How often is blood sugars being checked: daily Current symptoms/problems include hypoglycemia Continue metformin  to 500 mg XR daily Last eye exam was in September   A1C in office today was 6.7    No Known Allergies  Immunization History  Administered Date(s) Administered   Influenza, Seasonal, Injecte, Preservative Fre 06/12/2023   Influenza,inj,Quad PF,6+ Mos 06/06/2017, 03/06/2018, 03/28/2020, 04/03/2021, 04/25/2022   PFIZER(Purple Top)SARS-COV-2 Vaccination 07/29/2019, 08/19/2019   Pneumococcal Conjugate-13 06/29/2019   Pneumococcal Polysaccharide-23 06/06/2017   Tdap 02/20/2011, 06/06/2017    Tobacco History: Social History   Tobacco Use  Smoking Status Never  Smokeless Tobacco Never  Tobacco Comments   Pt states he smokes once or twice a month   Counseling given: Not Answered Tobacco comments: Pt states he smokes once or twice a month   Outpatient Encounter Medications as of 09/10/2023  Medication Sig   aspirin  81 MG chewable tablet Chew 81 mg by mouth daily.   cetirizine  (ZYRTEC ) 10 MG tablet Take 1 tablet (10 mg total) by mouth daily.   fluticasone  (FLONASE ) 50 MCG/ACT nasal spray Place 2 sprays into both nostrils daily.   hydrOXYzine  (ATARAX ) 10 MG tablet Take 1 tablet (10 mg total) by mouth 3 (three) times daily as needed.    ibuprofen  (ADVIL ) 600 MG tablet Take 1 tablet (600 mg total) by mouth every 6 (six) hours as needed.   [DISCONTINUED] atorvastatin  (LIPITOR ) 40 MG tablet Take 1 tablet (40 mg total) by mouth daily.   [DISCONTINUED] metFORMIN  (GLUCOPHAGE -XR) 500 MG 24 hr tablet Take 1 tablet (500 mg total) by mouth 2 (two) times daily with a meal.   [DISCONTINUED] traZODone  (DESYREL ) 50 MG tablet Take 0.5-1 tablets (25-50 mg total) by mouth at bedtime as needed for sleep.   atorvastatin  (LIPITOR ) 40 MG tablet Take 1 tablet (40 mg total) by mouth daily.   cephALEXin  (KEFLEX ) 500 MG capsule Take 1 capsule (500 mg total) by mouth 4 (four) times daily. (Patient not taking: Reported on 09/10/2023)   escitalopram  (LEXAPRO ) 10 MG tablet Take 1 tablet (10 mg total) by mouth daily. (Patient not taking: Reported on 06/30/2023)   metFORMIN  (GLUCOPHAGE -XR) 500 MG 24 hr tablet Take 1 tablet (500 mg total) by mouth 2 (two) times daily with a meal.   oxyCODONE  (ROXICODONE ) 5 MG immediate release tablet Take 1 tablet (5 mg total) by mouth every 6 (six) hours as needed for severe pain (pain score 7-10). (Patient not taking: Reported on 09/10/2023)   traZODone  (DESYREL ) 50 MG tablet Take 0.5-1 tablets (25-50 mg total) by mouth at bedtime as needed for sleep.   No facility-administered encounter medications on file as of 09/10/2023.    Review of Systems  Review of Systems  Constitutional: Negative.   HENT: Negative.    Cardiovascular:  Negative.   Gastrointestinal: Negative.   Allergic/Immunologic: Negative.   Neurological: Negative.   Psychiatric/Behavioral: Negative.       Objective:   BP 108/65   Pulse (!) 56   Temp 97.8 F (36.6 C) (Oral)   Wt 188 lb 6.4 oz (85.5 kg)   SpO2 98%   BMI 30.41 kg/m   Wt Readings from Last 5 Encounters:  09/10/23 188 lb 6.4 oz (85.5 kg)  07/09/23 185 lb 3.2 oz (84 kg)  07/01/23 185 lb 13.6 oz (84.3 kg)  06/12/23 187 lb 6.4 oz (85 kg)  01/27/23 179 lb (81.2 kg)     Physical  Exam Vitals and nursing note reviewed.  Constitutional:      General: He is not in acute distress.    Appearance: He is well-developed.  Cardiovascular:     Rate and Rhythm: Normal rate and regular rhythm.  Pulmonary:     Effort: Pulmonary effort is normal.     Breath sounds: Normal breath sounds.  Skin:    General: Skin is warm and dry.  Neurological:     Mental Status: He is alert and oriented to person, place, and time.       Assessment & Plan:   Diabetes mellitus without complication (HCC) -     Microalbumin / creatinine urine ratio -     POCT glycosylated hemoglobin (Hb A1C)  Hyperlipidemia LDL goal <100 -     Atorvastatin  Calcium ; Take 1 tablet (40 mg total) by mouth daily.  Dispense: 30 tablet; Refill: 2  Insomnia, unspecified type -     traZODone  HCl; Take 0.5-1 tablets (25-50 mg total) by mouth at bedtime as needed for sleep.  Dispense: 90 tablet; Refill: 2  Type 2 diabetes mellitus without complication, without long-term current use of insulin  (HCC) -     metFORMIN  HCl ER; Take 1 tablet (500 mg total) by mouth 2 (two) times daily with a meal.  Dispense: 90 tablet; Refill: 2  Other orders -     Cetirizine  HCl; Take 1 tablet (10 mg total) by mouth daily.  Dispense: 30 tablet; Refill: 11 -     Fluticasone  Propionate; Place 2 sprays into both nostrils daily.  Dispense: 16 g; Refill: 6     Return in about 3 months (around 12/10/2023).   Jerrlyn Morel, NP 09/10/2023

## 2023-09-11 LAB — MICROALBUMIN / CREATININE URINE RATIO
Creatinine, Urine: 66.3 mg/dL
Microalb/Creat Ratio: <5 mg/g{creat} (ref 0–29)
Microalbumin, Urine: <3 ug/mL

## 2023-09-12 ENCOUNTER — Ambulatory Visit (INDEPENDENT_AMBULATORY_CARE_PROVIDER_SITE_OTHER): Payer: Self-pay | Admitting: Rehabilitative and Restorative Service Providers"

## 2023-09-12 ENCOUNTER — Encounter: Payer: Self-pay | Admitting: Rehabilitative and Restorative Service Providers"

## 2023-09-12 DIAGNOSIS — R278 Other lack of coordination: Secondary | ICD-10-CM

## 2023-09-12 DIAGNOSIS — M6281 Muscle weakness (generalized): Secondary | ICD-10-CM

## 2023-09-12 DIAGNOSIS — M79641 Pain in right hand: Secondary | ICD-10-CM

## 2023-09-12 DIAGNOSIS — M25641 Stiffness of right hand, not elsewhere classified: Secondary | ICD-10-CM

## 2023-09-23 ENCOUNTER — Ambulatory Visit (INDEPENDENT_AMBULATORY_CARE_PROVIDER_SITE_OTHER): Payer: Self-pay | Admitting: Orthopedic Surgery

## 2023-09-23 DIAGNOSIS — S61401D Unspecified open wound of right hand, subsequent encounter: Secondary | ICD-10-CM

## 2023-09-23 DIAGNOSIS — S66821D Laceration of other specified muscles, fascia and tendons at wrist and hand level, right hand, subsequent encounter: Secondary | ICD-10-CM

## 2023-09-23 NOTE — Progress Notes (Signed)
   Drew [name] - 48 y.o. male MRN [id number]  Date of birth: Aug 23, 1975  Office Visit Note: Visit Date: 09/23/2023 PCP: Jerrlyn Morel, NP Referred by: Jerrlyn Morel, NP  Subjective:  HPI: Danian Yeoman is a 48 y.o. male who presents today for follow up 12 weeks status post right index finger extensor tendon repair, zone 5.  He is doing very well overall, has been discharged from therapy.  At this juncture, he has regained full use of the hand and has returned to work without significant restriction.  He is pleased with his progress and outcome.  Pertinent ROS were reviewed with the patient and found to be negative unless otherwise specified above in HPI.   Assessment & Plan: Visit Diagnoses: No diagnosis found.  Plan: He has done very well postoperatively, examination today demonstrates appropriate range of motion of the digit without significant restriction.  He is able to form composite fist and has full extension of the digit without notable lag or restriction.  I am pleased to see that he has been discharged from therapy as well after meeting all goals.  From my standpoint, he can progress his activities without restriction at this point, he is welcome to return to me as needed moving forward.  He expressed full understanding today.  Follow-up: No follow-ups on file.   Meds & Orders: No orders of the defined types were placed in this encounter.  No orders of the defined types were placed in this encounter.    Procedures: No procedures performed       Objective:   Vital Signs: There were no vitals taken for this visit.  Ortho Exam Right hand: - Well-healed incision over the dorsal aspect of the index finger at the zone 5 region - Able to perform extension of the index finger without notable extensor lag, matches the remainder of digits - Able to form composite fist without significant restriction, grip strength testing Jamar 2 right 40, left 80 - Sensation is intact throughout  the distal aspect of the hand and digits, median/radial/ulnar - AIN/PIN/interosseous intact - Hand is warm well-perfused  Imaging: No results found.   Alin Hutchins Alvia Jointer, M.D. [facility], Hand Surgery

## 2023-10-03 ENCOUNTER — Other Ambulatory Visit: Payer: Self-pay | Admitting: Nurse Practitioner

## 2023-10-03 ENCOUNTER — Other Ambulatory Visit: Payer: Self-pay

## 2023-10-03 DIAGNOSIS — E785 Hyperlipidemia, unspecified: Secondary | ICD-10-CM

## 2023-10-07 ENCOUNTER — Other Ambulatory Visit: Payer: Self-pay

## 2023-10-13 ENCOUNTER — Other Ambulatory Visit: Payer: Self-pay

## 2023-10-14 ENCOUNTER — Other Ambulatory Visit: Payer: Self-pay

## 2023-12-10 ENCOUNTER — Encounter: Payer: Self-pay | Admitting: Nurse Practitioner

## 2023-12-10 ENCOUNTER — Ambulatory Visit (INDEPENDENT_AMBULATORY_CARE_PROVIDER_SITE_OTHER): Payer: Self-pay | Admitting: Nurse Practitioner

## 2023-12-10 ENCOUNTER — Other Ambulatory Visit: Payer: Self-pay

## 2023-12-10 VITALS — BP 120/79 | HR 52 | Wt 182.4 lb

## 2023-12-10 DIAGNOSIS — M79671 Pain in right foot: Secondary | ICD-10-CM

## 2023-12-10 DIAGNOSIS — E785 Hyperlipidemia, unspecified: Secondary | ICD-10-CM

## 2023-12-10 DIAGNOSIS — M79604 Pain in right leg: Secondary | ICD-10-CM

## 2023-12-10 DIAGNOSIS — G4700 Insomnia, unspecified: Secondary | ICD-10-CM

## 2023-12-10 DIAGNOSIS — Z125 Encounter for screening for malignant neoplasm of prostate: Secondary | ICD-10-CM

## 2023-12-10 DIAGNOSIS — M79672 Pain in left foot: Secondary | ICD-10-CM

## 2023-12-10 DIAGNOSIS — M79605 Pain in left leg: Secondary | ICD-10-CM

## 2023-12-10 DIAGNOSIS — E119 Type 2 diabetes mellitus without complications: Secondary | ICD-10-CM

## 2023-12-10 DIAGNOSIS — Z1329 Encounter for screening for other suspected endocrine disorder: Secondary | ICD-10-CM

## 2023-12-10 DIAGNOSIS — Z13220 Encounter for screening for lipoid disorders: Secondary | ICD-10-CM

## 2023-12-10 LAB — POCT GLYCOSYLATED HEMOGLOBIN (HGB A1C): Hemoglobin A1C: 7 % — AB (ref 4.0–5.6)

## 2023-12-10 MED ORDER — TRAZODONE HCL 50 MG PO TABS
25.0000 mg | ORAL_TABLET | Freq: Every evening | ORAL | 2 refills | Status: DC | PRN
  Filled 2023-12-10 – 2024-01-14 (×2): qty 90, 90d supply, fill #0

## 2023-12-10 MED ORDER — ATORVASTATIN CALCIUM 40 MG PO TABS
40.0000 mg | ORAL_TABLET | Freq: Every day | ORAL | 2 refills | Status: DC
  Filled 2023-12-10: qty 90, 90d supply, fill #0
  Filled 2024-03-05 (×2): qty 90, 90d supply, fill #1

## 2023-12-10 MED ORDER — METHOCARBAMOL 500 MG PO TABS
500.0000 mg | ORAL_TABLET | Freq: Three times a day (TID) | ORAL | 2 refills | Status: AC | PRN
  Filled 2023-12-10: qty 60, 20d supply, fill #0

## 2023-12-10 MED ORDER — METFORMIN HCL ER 500 MG PO TB24
500.0000 mg | ORAL_TABLET | Freq: Two times a day (BID) | ORAL | 2 refills | Status: DC
  Filled 2023-12-10: qty 90, 45d supply, fill #0
  Filled 2024-01-19: qty 90, 45d supply, fill #1
  Filled 2024-03-05 (×2): qty 90, 45d supply, fill #2

## 2023-12-10 NOTE — Progress Notes (Signed)
 Subjective   Patient ID: Drew [name], male    DOB: 1975/09/29, 48 y.o.   MRN: [id number]  Chief Complaint  Patient presents with   Pain    Patient stated that he is having pain in his feet   Diabetes    Referring provider: Oley Bascom RAMAN, NP  Drew [name] is a 48 y.o. 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 male who presents for follow-up of Type 2 diabetes mellitus.   Patient is checking home blood sugars.   Home blood sugar records: BGs have been labile ranging higher than usual How often is blood sugars being checked: daily Current symptoms/problems include hypoglycemia Continue metformin  to 500 mg XR daily Last eye exam was in September   A1C in office today was 7.0  Note: complains of lower leg pain bilateral to both shins and ankle pain. Worse in the afternoons.  States that ankle pain has been going on for several years now.  Will place referral to podiatry.    No Known Allergies  Immunization History  Administered Date(s) Administered   Influenza, Seasonal, Injecte, Preservative Fre 06/12/2023   Influenza,inj,Quad PF,6+ Mos 06/06/2017, 03/06/2018, 03/28/2020, 04/03/2021, 04/25/2022   PFIZER(Purple Top)SARS-COV-2 Vaccination 07/29/2019, 08/19/2019   Pneumococcal Conjugate-13 06/29/2019   Pneumococcal Polysaccharide-23 06/06/2017   Tdap 02/20/2011, 06/06/2017    Tobacco History: Social History   Tobacco Use  Smoking Status Never  Smokeless Tobacco Never  Tobacco Comments   Pt states he smokes once or twice a month   Counseling given: Not Answered Tobacco comments: Pt states he smokes once or twice a month   Outpatient Encounter Medications as of 12/10/2023  Medication Sig   aspirin  81 MG chewable tablet Chew 81 mg by mouth daily.   fluticasone  (FLONASE ) 50  MCG/ACT nasal spray Place 2 sprays into both nostrils daily.   hydrOXYzine  (ATARAX ) 10 MG tablet Take 1 tablet (10 mg total) by mouth 3 (three) times daily as needed.   methocarbamol  (ROBAXIN ) 500 MG tablet Take 1 tablet (500 mg total) by mouth every 8 (eight) hours as needed for muscle spasms.   [DISCONTINUED] atorvastatin  (LIPITOR ) 40 MG tablet Take 1 tablet (40 mg total) by mouth daily.   [DISCONTINUED] metFORMIN  (GLUCOPHAGE -XR) 500 MG 24 hr tablet Take 1 tablet (500 mg total) by mouth 2 (two) times daily with a meal.   [DISCONTINUED] traZODone  (DESYREL ) 50 MG tablet Take 0.5-1 tablets (25-50 mg total) by mouth at bedtime as needed for sleep.   atorvastatin  (LIPITOR ) 40 MG tablet Take 1 tablet (40 mg total) by mouth daily.   cephALEXin  (KEFLEX ) 500 MG capsule Take 1 capsule (500 mg total) by mouth 4 (four) times daily. (Patient not taking: Reported on 09/10/2023)   cetirizine  (ZYRTEC ) 10 MG tablet Take 1 tablet (10 mg total) by mouth daily.   escitalopram  (LEXAPRO ) 10 MG tablet Take 1 tablet (10 mg total) by mouth daily. (Patient not taking: Reported on 06/30/2023)   ibuprofen  (ADVIL ) 600 MG tablet Take 1 tablet (600 mg total) by mouth every 6 (six) hours as needed.   metFORMIN  (GLUCOPHAGE -XR) 500 MG 24 hr tablet Take 1 tablet (500 mg total) by mouth 2 (two) times daily with a meal.   oxyCODONE  (ROXICODONE ) 5 MG immediate release tablet Take 1 tablet (5 mg total)  by mouth every 6 (six) hours as needed for severe pain (pain score 7-10). (Patient not taking: Reported on 09/10/2023)   traZODone  (DESYREL ) 50 MG tablet Take 0.5-1 tablets (25-50 mg total) by mouth at bedtime as needed for sleep.   No facility-administered encounter medications on file as of 12/10/2023.    Review of Systems  Review of Systems  Constitutional: Negative.   HENT: Negative.    Cardiovascular: Negative.   Gastrointestinal: Negative.   Allergic/Immunologic: Negative.   Neurological: Negative.   Psychiatric/Behavioral:  Negative.       Objective:   BP 120/79   Pulse (!) 52   Wt 182 lb 6.4 oz (82.7 kg)   SpO2 97%   BMI 29.44 kg/m   Wt Readings from Last 5 Encounters:  12/10/23 182 lb 6.4 oz (82.7 kg)  09/10/23 188 lb 6.4 oz (85.5 kg)  07/09/23 185 lb 3.2 oz (84 kg)  07/01/23 185 lb 13.6 oz (84.3 kg)  06/12/23 187 lb 6.4 oz (85 kg)     Physical Exam Vitals and nursing note reviewed.  Constitutional:      General: He is not in acute distress.    Appearance: He is well-developed.  Cardiovascular:     Rate and Rhythm: Normal rate and regular rhythm.  Pulmonary:     Effort: Pulmonary effort is normal.     Breath sounds: Normal breath sounds.  Skin:    General: Skin is warm and dry.  Neurological:     Mental Status: He is alert and oriented to person, place, and time.       Assessment & Plan:   Diabetes mellitus without complication (HCC) -     POCT glycosylated hemoglobin (Hb A1C) -     CBC -     Comprehensive metabolic panel with GFR  Thyroid disorder screen -     TSH  Prostate cancer screening -     PSA  Lipid screening -     Lipid panel  Bilateral leg pain -     Methocarbamol ; Take 1 tablet (500 mg total) by mouth every 8 (eight) hours as needed for muscle spasms.  Dispense: 60 tablet; Refill: 2  Bilateral foot pain -     Ambulatory referral to Podiatry  Insomnia, unspecified type -     traZODone  HCl; Take 0.5-1 tablets (25-50 mg total) by mouth at bedtime as needed for sleep.  Dispense: 90 tablet; Refill: 2  Type 2 diabetes mellitus without complication, without long-term current use of insulin  (HCC) -     metFORMIN  HCl ER; Take 1 tablet (500 mg total) by mouth 2 (two) times daily with a meal.  Dispense: 90 tablet; Refill: 2  Hyperlipidemia LDL goal <100 -     Atorvastatin  Calcium ; Take 1 tablet (40 mg total) by mouth daily.  Dispense: 90 tablet; Refill: 2     Return in about 3 months (around 03/11/2024).   Bascom GORMAN Borer, NP 12/10/2023

## 2023-12-11 ENCOUNTER — Ambulatory Visit: Payer: Self-pay | Admitting: Nurse Practitioner

## 2023-12-11 LAB — COMPREHENSIVE METABOLIC PANEL WITH GFR
ALT: 23 IU/L (ref 0–44)
AST: 25 IU/L (ref 0–40)
Albumin: 4.4 g/dL (ref 4.1–5.1)
Alkaline Phosphatase: 99 IU/L (ref 44–121)
BUN/Creatinine Ratio: 21 — ABNORMAL HIGH (ref 9–20)
BUN: 15 mg/dL (ref 6–24)
Bilirubin Total: 0.5 mg/dL (ref 0.0–1.2)
CO2: 19 mmol/L — ABNORMAL LOW (ref 20–29)
Calcium: 9.2 mg/dL (ref 8.7–10.2)
Chloride: 107 mmol/L — ABNORMAL HIGH (ref 96–106)
Creatinine, Ser: 0.72 mg/dL — ABNORMAL LOW (ref 0.76–1.27)
Globulin, Total: 2 g/dL (ref 1.5–4.5)
Glucose: 142 mg/dL — ABNORMAL HIGH (ref 70–99)
Potassium: 4.3 mmol/L (ref 3.5–5.2)
Sodium: 141 mmol/L (ref 134–144)
Total Protein: 6.4 g/dL (ref 6.0–8.5)
eGFR: 113 mL/min/1.73 (ref >59)

## 2023-12-11 LAB — CBC
Hematocrit: 42.7 % (ref 37.5–51.0)
Hemoglobin: 14.2 g/dL (ref 13.0–17.7)
MCH: 32.5 pg (ref 26.6–33.0)
MCHC: 33.3 g/dL (ref 31.5–35.7)
MCV: 98 fL — ABNORMAL HIGH (ref 79–97)
Platelets: 216 x10E3/uL (ref 150–450)
RBC: 4.37 x10E6/uL (ref 4.14–5.80)
RDW: 13.3 % (ref 11.6–15.4)
WBC: 4.7 x10E3/uL (ref 3.4–10.8)

## 2023-12-11 LAB — LIPID PANEL
Chol/HDL Ratio: 2.3 ratio (ref 0.0–5.0)
Cholesterol, Total: 99 mg/dL — ABNORMAL LOW (ref 100–199)
HDL: 44 mg/dL (ref >39)
LDL Chol Calc (NIH): 44 mg/dL (ref 0–99)
Triglycerides: 40 mg/dL (ref 0–149)
VLDL Cholesterol Cal: 11 mg/dL (ref 5–40)

## 2023-12-11 LAB — PSA: Prostate Specific Ag, Serum: 0.5 ng/mL (ref 0.0–4.0)

## 2023-12-11 LAB — TSH: TSH: 1.7 u[IU]/mL (ref 0.450–4.500)

## 2023-12-16 ENCOUNTER — Ambulatory Visit: Payer: Self-pay | Admitting: Podiatry

## 2023-12-23 ENCOUNTER — Ambulatory Visit (INDEPENDENT_AMBULATORY_CARE_PROVIDER_SITE_OTHER): Payer: Self-pay

## 2023-12-23 ENCOUNTER — Other Ambulatory Visit: Payer: Self-pay

## 2023-12-23 ENCOUNTER — Ambulatory Visit (INDEPENDENT_AMBULATORY_CARE_PROVIDER_SITE_OTHER): Payer: Self-pay | Admitting: Podiatry

## 2023-12-23 VITALS — Ht 66.0 in | Wt 182.4 lb

## 2023-12-23 DIAGNOSIS — M722 Plantar fascial fibromatosis: Secondary | ICD-10-CM

## 2023-12-23 DIAGNOSIS — M7662 Achilles tendinitis, left leg: Secondary | ICD-10-CM

## 2023-12-23 DIAGNOSIS — M7752 Other enthesopathy of left foot: Secondary | ICD-10-CM

## 2023-12-23 DIAGNOSIS — M7751 Other enthesopathy of right foot: Secondary | ICD-10-CM

## 2023-12-23 DIAGNOSIS — M7661 Achilles tendinitis, right leg: Secondary | ICD-10-CM

## 2023-12-23 MED ORDER — MELOXICAM 15 MG PO TABS
15.0000 mg | ORAL_TABLET | Freq: Every day | ORAL | 3 refills | Status: AC
  Filled 2023-12-23: qty 30, 30d supply, fill #0
  Filled 2024-01-19: qty 30, 30d supply, fill #1
  Filled 2024-02-23 (×2): qty 30, 30d supply, fill #2
  Filled 2024-03-26: qty 30, 30d supply, fill #3

## 2023-12-23 NOTE — Patient Instructions (Addendum)
  VISIT SUMMARY: Today, you were seen for bilateral heel pain, which is worse on the left side, and swelling and pain in your hands. You have been experiencing these symptoms for over a year. We discussed your symptoms, reviewed your radiographs, and developed a treatment plan to help manage your pain and improve your condition.  YOUR PLAN: -BILATERAL ACHILLES TENDINITIS: Achilles tendinitis is inflammation of the Achilles tendon, which connects your calf muscles to your heel bone. You will start a home physical therapy plan to help strengthen and stretch the tendon. Additionally, you will take meloxicam  once daily for one month to reduce inflammation and pain.  -LEFT FOOT PLANTAR FASCIITIS: Plantar fasciitis is inflammation of the band of tissue that runs across the bottom of your foot and connects your heel bone to your toes. You received a corticosteroid injection in your left heel to reduce inflammation. You will also start a home physical therapy plan to help alleviate the pain and improve your condition.  INSTRUCTIONS: Please follow the home physical therapy plans provided for both your Achilles tendinitis and plantar fasciitis. Take meloxicam  once daily for one month as prescribed. If you have any questions or if your symptoms do not improve, please schedule a follow-up appointment.                      Contains text generated by [employer]bridge.                                 Contains text generated by [employer]bridge.    [id number]

## 2023-12-23 NOTE — Progress Notes (Signed)
 Subjective:  Patient ID: Drew [name], male    DOB: 1976-04-07,  MRN: [id number]  Chief Complaint  Patient presents with   Foot Pain    Rm 3 Patient is here for bilateral foot pain. Patient states pain in the achilles that radiate towards the legs. Patient states having difficulty walking and standing due to pain. Pain has been present for the last 12 months.    Discussed the use of AI scribe software for clinical note transcription with the patient, who gave verbal consent to proceed.  History of Present Illness Drew [name] is a 48 year old male who presents with bilateral heel pain, worse on the left side.  He has been experiencing pain in the back of his heels for over a year, with the left side being more painful than the right. The pain begins when he starts to walk, making it difficult to continue. It is located at the bottom of the heel and the patient describes pain in the back of the heel as well. He describes a burning sensation in the affected area and notes that when he steps, 'everything hurts.' No recent injuries or falls have occurred, although he recalls an injury approximately ten years ago that he does not believe is related to his current symptoms.  In addition to heel pain, he reports swelling and pain in his hands, particularly in his fingers. He mentions that his father had rheumatoid arthritis, but he has not been checked for arthritis himself. He feels that the pain in his hands is similar to the pain in his heels.  No back issues, sciatica, or pinched nerves are present, although he initially thought it might be related to sciatica. He once had a bump on his wrist that was treated with steroids, which alleviated the pain temporarily, but the pain has since returned.  Patient is primarily Spanish-speaking and an interpreter is present today.      Objective:    Physical Exam VASCULAR: DP and PT pulse palpable. Foot is warm and well-perfused. Capillary fill time is  brisk. DERMATOLOGIC: Normal skin turgor, texture, and temperature. No open lesions, rashes, or ulcerations. NEUROLOGIC: Normal sensation to light touch and pressure. No paresthesias on examination. ORTHOPEDIC: Pain in the central plantar fascia at the calcaneal tubercle insertion. Pain in the watershed area of the Achilles tendon without discontinuity. 5/5 strength in both feet. Smooth pain-free range of motion of all examined joints. No ecchymosis or bruising. No gross deformity.   No images are attached to the encounter.    Results Bilateral foot radiographs (12/23/2023): No arthritic changes, no fracture or stress fracture no plantar posterior heel spur.   Assessment:   1. Plantar fasciitis of left foot   2. Achilles tendinitis of both lower extremities      Plan:  Patient was evaluated and treated and all questions answered.  Assessment and Plan Assessment & Plan Bilateral Achilles tendinitis Chronic bilateral Achilles tendinitis with pain in the watershed area of the Achilles tendon. No evidence of discontinuity. Radiographs show no arthritic changes or fractures. The condition has persisted for over a year, with the left side being more symptomatic than the right. - Dispense home physical therapy plan for bilateral Achilles tendinitis. - Prescribe meloxicam  once daily for one month.  Left foot plantar fasciitis Chronic left foot plantar fasciitis with pain in the plantar medial band and central plantar fascia at the insertion on the calcaneal tubercle. Radiographs show no arthritic changes or fractures. The condition has persisted for  over a year and is more symptomatic than the right foot. A corticosteroid injection is recommended to reduce inflammation. - Administer corticosteroid injection to the left heel for plantar fasciitis. - Dispense home physical therapy plan for left foot plantar fasciitis.   After sterile prep with povidone-iodine solution and alcohol, the left  heel was injected with 0.5cc 2% xylocaine  plain, 0.5cc 0.5% marcaine plain, 20 mg triamcinolone acetonide, and 4 mg dexamethasone  was injected along the medial plantar fascia at the insertion on the plantar calcaneus. The patient tolerated the procedure well without complication.    Return in about 6 weeks (around 02/03/2024) for recheck plantar fasciitis, re-check Achilles tendon.

## 2024-01-14 ENCOUNTER — Other Ambulatory Visit: Payer: Self-pay

## 2024-01-16 ENCOUNTER — Other Ambulatory Visit: Payer: Self-pay

## 2024-01-19 ENCOUNTER — Other Ambulatory Visit: Payer: Self-pay

## 2024-01-26 LAB — HM DIABETES EYE EXAM

## 2024-01-27 ENCOUNTER — Ambulatory Visit: Payer: Self-pay | Admitting: Nurse Practitioner

## 2024-02-03 ENCOUNTER — Ambulatory Visit: Payer: Self-pay | Admitting: Podiatry

## 2024-02-23 ENCOUNTER — Other Ambulatory Visit: Payer: Self-pay

## 2024-02-24 ENCOUNTER — Ambulatory Visit: Payer: Self-pay | Admitting: Podiatry

## 2024-02-25 ENCOUNTER — Other Ambulatory Visit: Payer: Self-pay

## 2024-03-05 ENCOUNTER — Other Ambulatory Visit: Payer: Self-pay

## 2024-03-11 ENCOUNTER — Ambulatory Visit (INDEPENDENT_AMBULATORY_CARE_PROVIDER_SITE_OTHER): Payer: Self-pay | Admitting: Nurse Practitioner

## 2024-03-11 ENCOUNTER — Encounter: Payer: Self-pay | Admitting: Nurse Practitioner

## 2024-03-11 ENCOUNTER — Other Ambulatory Visit: Payer: Self-pay

## 2024-03-11 VITALS — BP 124/88 | HR 78 | Wt 178.0 lb

## 2024-03-11 DIAGNOSIS — E119 Type 2 diabetes mellitus without complications: Secondary | ICD-10-CM

## 2024-03-11 DIAGNOSIS — M2550 Pain in unspecified joint: Secondary | ICD-10-CM

## 2024-03-11 LAB — POCT GLYCOSYLATED HEMOGLOBIN (HGB A1C): Hemoglobin A1C: 6.6 % — AB (ref 4.0–5.6)

## 2024-03-11 MED ORDER — PREDNISONE 20 MG PO TABS
20.0000 mg | ORAL_TABLET | Freq: Every day | ORAL | 0 refills | Status: AC
  Filled 2024-03-11: qty 5, 5d supply, fill #0

## 2024-03-11 MED ORDER — CYCLOBENZAPRINE HCL 10 MG PO TABS
10.0000 mg | ORAL_TABLET | Freq: Three times a day (TID) | ORAL | 0 refills | Status: AC | PRN
  Filled 2024-03-11: qty 30, 10d supply, fill #0

## 2024-03-11 NOTE — Progress Notes (Signed)
 Subjective   Patient ID: Drew [name], male    DOB: 1975/09/23, 48 y.o.   MRN: [id number]  Chief Complaint  Patient presents with   Diabetes   Pain    Pain located in the arms and legs, pain the arms start at the elbow and radiates down to fingers causing stiffness, pain in the legs start at the knees and radiates down     Referring provider: Oley Bascom RAMAN, NP  Drew [name] is a 48 y.o. 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 male who presents for follow-up of Type 2 diabetes mellitus.   Patient is checking home blood sugars.   Home blood sugar records: BGs have been labile ranging higher than usual How often is blood sugars being checked: daily Current symptoms/problems include hypoglycemia Continue metformin  to 500 mg XR daily Last eye exam was in September   A1C in office today was 6.6  Note: Patient complains today of multiple joint pain to elbows wrists and knees and ankles.  We will check autoimmune panel.  Patient does complain of mid back pain as well.  We will trial prednisone  and Flexeril.  No Known Allergies  Immunization History  Administered Date(s) Administered   Influenza, Seasonal, Injecte, Preservative Fre 06/12/2023   Influenza,inj,Quad PF,6+ Mos 06/06/2017, 03/06/2018, 03/28/2020, 04/03/2021, 04/25/2022, 03/11/2024   PFIZER(Purple Top)SARS-COV-2 Vaccination 07/29/2019, 08/19/2019   Pneumococcal Conjugate-13 06/29/2019   Pneumococcal Polysaccharide-23 06/06/2017   Tdap 02/20/2011, 06/06/2017    Tobacco History: Social History   Tobacco Use  Smoking Status Never  Smokeless Tobacco Never  Tobacco Comments   Pt states he smokes once or twice a month   Counseling given: Not Answered Tobacco comments: Pt states he smokes once or twice a month   Outpatient  Encounter Medications as of 03/11/2024  Medication Sig   cyclobenzaprine (FLEXERIL) 10 MG tablet Take 1 tablet (10 mg total) by mouth 3 (three) times daily as needed for muscle spasms.   predniSONE  (DELTASONE ) 20 MG tablet Take 1 tablet (20 mg total) by mouth daily with breakfast.   aspirin  81 MG chewable tablet Chew 81 mg by mouth daily.   atorvastatin  (LIPITOR ) 40 MG tablet Take 1 tablet (40 mg total) by mouth daily.   cephALEXin  (KEFLEX ) 500 MG capsule Take 1 capsule (500 mg total) by mouth 4 (four) times daily. (Patient not taking: Reported on 09/10/2023)   cetirizine  (ZYRTEC ) 10 MG tablet Take 1 tablet (10 mg total) by mouth daily.   escitalopram  (LEXAPRO ) 10 MG tablet Take 1 tablet (10 mg total) by mouth daily. (Patient not taking: Reported on 06/30/2023)   fluticasone  (FLONASE ) 50 MCG/ACT nasal spray Place 2 sprays into both nostrils daily.   hydrOXYzine  (ATARAX ) 10 MG tablet Take 1 tablet (10 mg total) by mouth 3 (three) times daily as needed.   ibuprofen  (ADVIL ) 600 MG tablet Take 1 tablet (600 mg total) by mouth every 6 (six) hours as needed.   meloxicam  (MOBIC ) 15 MG tablet Take 1 tablet (15 mg total) by mouth daily.   metFORMIN  (GLUCOPHAGE -XR) 500 MG 24 hr tablet Take 1 tablet (500 mg total) by mouth 2 (two) times daily with a meal.   methocarbamol  (ROBAXIN ) 500 MG tablet Take 1 tablet (500 mg total) by mouth every 8 (eight) hours as needed  for muscle spasms.   oxyCODONE  (ROXICODONE ) 5 MG immediate release tablet Take 1 tablet (5 mg total) by mouth every 6 (six) hours as needed for severe pain (pain score 7-10). (Patient not taking: Reported on 09/10/2023)   traZODone  (DESYREL ) 50 MG tablet Take 0.5-1 tablets (25-50 mg total) by mouth at bedtime as needed for sleep.   No facility-administered encounter medications on file as of 03/11/2024.    Review of Systems  Review of Systems  Constitutional: Negative.   HENT: Negative.    Cardiovascular: Negative.   Gastrointestinal: Negative.    Musculoskeletal:  Positive for arthralgias, back pain and myalgias.  Allergic/Immunologic: Negative.   Neurological: Negative.   Psychiatric/Behavioral: Negative.       Objective:   BP 124/88 (BP Location: Left Arm, Patient Position: Sitting, Cuff Size: Normal)   Pulse 78   Wt 178 lb (80.7 kg)   SpO2 98%   BMI 28.73 kg/m   Wt Readings from Last 5 Encounters:  03/11/24 178 lb (80.7 kg)  12/23/23 182 lb 6.4 oz (82.7 kg)  12/10/23 182 lb 6.4 oz (82.7 kg)  09/10/23 188 lb 6.4 oz (85.5 kg)  07/09/23 185 lb 3.2 oz (84 kg)     Physical Exam Vitals and nursing note reviewed.  Constitutional:      General: He is not in acute distress.    Appearance: He is well-developed.  Cardiovascular:     Rate and Rhythm: Normal rate and regular rhythm.  Pulmonary:     Effort: Pulmonary effort is normal.     Breath sounds: Normal breath sounds.  Skin:    General: Skin is warm and dry.  Neurological:     Mental Status: He is alert and oriented to person, place, and time.       Assessment & Plan:   Diabetes mellitus without complication (HCC) -     POCT glycosylated hemoglobin (Hb A1C) -     CBC -     Comprehensive metabolic panel with GFR  Multiple joint pain -     CBC -     Comprehensive metabolic panel with GFR -     Rheumatoid factor -     ANA -     Sedimentation rate -     C-reactive protein -     predniSONE ; Take 1 tablet (20 mg total) by mouth daily with breakfast.  Dispense: 5 tablet; Refill: 0 -     Cyclobenzaprine HCl; Take 1 tablet (10 mg total) by mouth 3 (three) times daily as needed for muscle spasms.  Dispense: 30 tablet; Refill: 0  Other orders -     Flu Vaccine QUAD 15mo+IM (Fluarix, Fluzone & Alfiuria Quad PF)     Return in about 3 months (around 06/11/2024).   Bascom GORMAN Borer, NP 03/11/2024

## 2024-03-12 ENCOUNTER — Ambulatory Visit: Payer: Self-pay | Admitting: Nurse Practitioner

## 2024-03-12 LAB — COMPREHENSIVE METABOLIC PANEL WITH GFR
ALT: 28 IU/L (ref 0–44)
AST: 24 IU/L (ref 0–40)
Albumin: 4.5 g/dL (ref 4.1–5.1)
Alkaline Phosphatase: 93 IU/L (ref 47–123)
BUN/Creatinine Ratio: 29 — ABNORMAL HIGH (ref 9–20)
BUN: 20 mg/dL (ref 6–24)
Bilirubin Total: 0.8 mg/dL (ref 0.0–1.2)
CO2: 19 mmol/L — ABNORMAL LOW (ref 20–29)
Calcium: 9.5 mg/dL (ref 8.7–10.2)
Chloride: 108 mmol/L — ABNORMAL HIGH (ref 96–106)
Creatinine, Ser: 0.7 mg/dL — ABNORMAL LOW (ref 0.76–1.27)
Globulin, Total: 2.6 g/dL (ref 1.5–4.5)
Glucose: 155 mg/dL — ABNORMAL HIGH (ref 70–99)
Potassium: 4.1 mmol/L (ref 3.5–5.2)
Sodium: 141 mmol/L (ref 134–144)
Total Protein: 7.1 g/dL (ref 6.0–8.5)
eGFR: 114 mL/min/1.73 (ref >59)

## 2024-03-12 LAB — CBC
Hematocrit: 42.8 % (ref 37.5–51.0)
Hemoglobin: 14.5 g/dL (ref 13.0–17.7)
MCH: 32.8 pg (ref 26.6–33.0)
MCHC: 33.9 g/dL (ref 31.5–35.7)
MCV: 97 fL (ref 79–97)
Platelets: 196 x10E3/uL (ref 150–450)
RBC: 4.42 x10E6/uL (ref 4.14–5.80)
RDW: 13 % (ref 11.6–15.4)
WBC: 5.1 x10E3/uL (ref 3.4–10.8)

## 2024-03-12 LAB — C-REACTIVE PROTEIN: CRP: <1 mg/L (ref 0–10)

## 2024-03-12 LAB — ANA: Anti Nuclear Antibody (ANA): NEGATIVE

## 2024-03-12 LAB — SEDIMENTATION RATE: Sed Rate: 5 mm/h (ref 0–15)

## 2024-03-12 LAB — RHEUMATOID FACTOR: Rheumatoid fact SerPl-aCnc: <10 [IU]/mL (ref <14.0)

## 2024-03-15 ENCOUNTER — Encounter: Payer: Self-pay | Admitting: Radiology

## 2024-03-18 ENCOUNTER — Encounter: Payer: Self-pay | Admitting: Podiatry

## 2024-03-18 ENCOUNTER — Ambulatory Visit (INDEPENDENT_AMBULATORY_CARE_PROVIDER_SITE_OTHER): Payer: Self-pay | Admitting: Podiatry

## 2024-03-18 DIAGNOSIS — M722 Plantar fascial fibromatosis: Secondary | ICD-10-CM

## 2024-03-18 DIAGNOSIS — M7661 Achilles tendinitis, right leg: Secondary | ICD-10-CM

## 2024-03-18 DIAGNOSIS — M7662 Achilles tendinitis, left leg: Secondary | ICD-10-CM

## 2024-03-18 NOTE — Progress Notes (Signed)
  Subjective:  Patient ID: Drew [name], male    DOB: December 08, 1975,  MRN: [id number]  Chief Complaint  Patient presents with   Plantar Fasciitis    Rm7 Patient follow up on plantar fasciitis left foot/patient says he has improvement with injection and medication only has a small pain with pressure.    Discussed the use of AI scribe software for clinical note transcription with the patient, who gave verbal consent to proceed.  History of Present Illness Drew [name] is a 48 year old male who presents with bilateral heel pain, worse on the left side.  He reports significant improvement in his symptoms      Objective:    Physical Exam VASCULAR: DP and PT pulse palpable. Foot is warm and well-perfused. Capillary fill time is brisk. DERMATOLOGIC: Normal skin turgor, texture, and temperature. No open lesions, rashes, or ulcerations. NEUROLOGIC: Normal sensation to light touch and pressure. No paresthesias on examination. ORTHOPEDIC: No pain to palpation today   No images are attached to the encounter.    Results Bilateral foot radiographs (12/23/2023): No arthritic changes, no fracture or stress fracture no plantar posterior heel spur.   Assessment:   1. Plantar fasciitis of left foot   2. Achilles tendinitis of both lower extremities       Plan:  Patient was evaluated and treated and all questions answered.  Assessment and Plan Assessment & Plan Bilateral Achilles tendinitis Doing much better no pain in his Achilles or plantar fascia today, acute continue meloxicam  and home physical therapy plan I dispensed him exercises in Spanish today.  He will continue Loxitane as needed for the next 1 to 2 months.  Follow-up with me.  If it returns or worsens.       Return if symptoms worsen or fail to improve.

## 2024-03-29 ENCOUNTER — Other Ambulatory Visit: Payer: Self-pay

## 2024-03-30 ENCOUNTER — Other Ambulatory Visit: Payer: Self-pay

## 2024-04-25 ENCOUNTER — Other Ambulatory Visit: Payer: Self-pay | Admitting: Nurse Practitioner

## 2024-04-25 DIAGNOSIS — E119 Type 2 diabetes mellitus without complications: Secondary | ICD-10-CM

## 2024-04-26 ENCOUNTER — Other Ambulatory Visit: Payer: Self-pay

## 2024-04-27 ENCOUNTER — Other Ambulatory Visit: Payer: Self-pay

## 2024-04-27 MED ORDER — METFORMIN HCL ER 500 MG PO TB24
500.0000 mg | ORAL_TABLET | Freq: Two times a day (BID) | ORAL | 1 refills | Status: DC
  Filled 2024-04-27: qty 90, 45d supply, fill #0

## 2024-04-28 ENCOUNTER — Other Ambulatory Visit: Payer: Self-pay

## 2024-06-11 ENCOUNTER — Other Ambulatory Visit: Payer: Self-pay

## 2024-06-11 ENCOUNTER — Encounter: Payer: Self-pay | Admitting: Nurse Practitioner

## 2024-06-11 ENCOUNTER — Ambulatory Visit: Payer: Self-pay | Admitting: Nurse Practitioner

## 2024-06-11 VITALS — BP 121/76 | HR 65 | Temp 98.0°F | Wt 182.0 lb

## 2024-06-11 DIAGNOSIS — G4700 Insomnia, unspecified: Secondary | ICD-10-CM

## 2024-06-11 DIAGNOSIS — E785 Hyperlipidemia, unspecified: Secondary | ICD-10-CM

## 2024-06-11 DIAGNOSIS — E119 Type 2 diabetes mellitus without complications: Secondary | ICD-10-CM

## 2024-06-11 LAB — POCT GLYCOSYLATED HEMOGLOBIN (HGB A1C): Hemoglobin A1C: 7 % — AB (ref 4.0–5.6)

## 2024-06-11 MED ORDER — METFORMIN HCL ER 500 MG PO TB24
500.0000 mg | ORAL_TABLET | Freq: Two times a day (BID) | ORAL | 1 refills | Status: AC
  Filled 2024-06-11: qty 180, 90d supply, fill #0

## 2024-06-11 MED ORDER — TRAZODONE HCL 50 MG PO TABS
25.0000 mg | ORAL_TABLET | Freq: Every evening | ORAL | 2 refills | Status: AC | PRN
  Filled 2024-06-11: qty 90, 90d supply, fill #0

## 2024-06-11 MED ORDER — CETIRIZINE HCL 10 MG PO TABS
10.0000 mg | ORAL_TABLET | Freq: Every day | ORAL | 11 refills | Status: AC
  Filled 2024-06-11: qty 30, 30d supply, fill #0

## 2024-06-11 MED ORDER — HYDROXYZINE HCL 10 MG PO TABS
10.0000 mg | ORAL_TABLET | Freq: Three times a day (TID) | ORAL | 0 refills | Status: AC | PRN
  Filled 2024-06-11: qty 30, 10d supply, fill #0

## 2024-06-11 MED ORDER — ASPIRIN 81 MG PO CHEW
81.0000 mg | CHEWABLE_TABLET | Freq: Every day | ORAL | 2 refills | Status: AC
  Filled 2024-06-11: qty 90, 90d supply, fill #0

## 2024-06-11 MED ORDER — ATORVASTATIN CALCIUM 40 MG PO TABS
40.0000 mg | ORAL_TABLET | Freq: Every day | ORAL | 2 refills | Status: AC
  Filled 2024-06-11: qty 90, 90d supply, fill #0

## 2024-06-11 MED ORDER — FLUTICASONE PROPIONATE 50 MCG/ACT NA SUSP
2.0000 | Freq: Every day | NASAL | 6 refills | Status: AC
  Filled 2024-06-11: qty 16, 30d supply, fill #0

## 2024-06-11 NOTE — Progress Notes (Signed)
 "  Subjective   Patient ID: Drew [name], male    DOB: 27-May-1975, 49 y.o.   MRN: [id number]  Chief Complaint  Patient presents with   Diabetes    Referring provider: Oley Bascom RAMAN, NP  Drew [name] is a 49 y.o. 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 male who presents for follow-up of Type 2 diabetes mellitus.   Patient is checking home blood sugars.   Home blood sugar records: BGs have been labile ranging higher than usual How often is blood sugars being checked: daily Current symptoms/problems include hypoglycemia Continue metformin  to 500 mg XR daily Last eye exam was in September   A1C in office today was 7.0   Denies f/c/s, n/v/d, hemoptysis, PND, leg swelling Denies chest pain or edema   Allergies[1]  Immunization History  Administered Date(s) Administered   Influenza, Seasonal, Injecte, Preservative Fre 06/12/2023   Influenza,inj,Quad PF,6+ Mos 06/06/2017, 03/06/2018, 03/28/2020, 04/03/2021, 04/25/2022, 03/11/2024   PFIZER(Purple Top)SARS-COV-2 Vaccination 07/29/2019, 08/19/2019   Pneumococcal Conjugate-13 06/29/2019   Pneumococcal Polysaccharide-23 06/06/2017   Tdap 02/20/2011, 06/06/2017    Tobacco History: Tobacco Use History[2] Counseling given: Not Answered Tobacco comments: Pt states he smokes once or twice a month   Outpatient Encounter Medications as of 06/11/2024  Medication Sig   cyclobenzaprine  (FLEXERIL ) 10 MG tablet Take 1 tablet (10 mg total) by mouth 3 (three) times daily as needed for muscle spasms.   [DISCONTINUED] aspirin  81 MG chewable tablet Chew 81 mg by mouth daily.   [DISCONTINUED] atorvastatin  (LIPITOR ) 40 MG tablet Take 1 tablet (40 mg total) by mouth daily.   [DISCONTINUED] fluticasone  (FLONASE ) 50 MCG/ACT nasal spray Place 2 sprays into both  nostrils daily.   [DISCONTINUED] metFORMIN  (GLUCOPHAGE -XR) 500 MG 24 hr tablet Take 1 tablet (500 mg total) by mouth 2 (two) times daily with a meal.   [DISCONTINUED] traZODone  (DESYREL ) 50 MG tablet Take 0.5-1 tablets (25-50 mg total) by mouth at bedtime as needed for sleep.   aspirin  81 MG chewable tablet Chew 1 tablet (81 mg total) by mouth daily.   atorvastatin  (LIPITOR ) 40 MG tablet Take 1 tablet (40 mg total) by mouth daily.   cephALEXin  (KEFLEX ) 500 MG capsule Take 1 capsule (500 mg total) by mouth 4 (four) times daily. (Patient not taking: Reported on 09/10/2023)   cetirizine  (ZYRTEC ) 10 MG tablet Take 1 tablet (10 mg total) by mouth daily.   escitalopram  (LEXAPRO ) 10 MG tablet Take 1 tablet (10 mg total) by mouth daily. (Patient not taking: Reported on 06/30/2023)   fluticasone  (FLONASE ) 50 MCG/ACT nasal spray Place 2 sprays into both nostrils daily.   hydrOXYzine  (ATARAX ) 10 MG tablet Take 1 tablet (10 mg total) by mouth 3 (three) times daily as needed.   ibuprofen  (ADVIL ) 600 MG tablet Take 1 tablet (600 mg total) by mouth every 6 (six) hours as needed.   meloxicam  (MOBIC ) 15 MG tablet Take 1 tablet (15 mg total) by mouth daily. (Patient not taking: Reported on 06/11/2024)   metFORMIN  (GLUCOPHAGE -XR) 500 MG 24 hr tablet Take 1 tablet (500 mg total) by mouth 2 (two) times daily with a meal.   methocarbamol  (ROBAXIN ) 500 MG tablet Take 1 tablet (500 mg total) by mouth every 8 (eight) hours as needed for muscle spasms.   oxyCODONE  (  ROXICODONE ) 5 MG immediate release tablet Take 1 tablet (5 mg total) by mouth every 6 (six) hours as needed for severe pain (pain score 7-10). (Patient not taking: Reported on 06/11/2024)   predniSONE  (DELTASONE ) 20 MG tablet Take 1 tablet (20 mg total) by mouth daily with breakfast. (Patient not taking: Reported on 06/11/2024)   traZODone  (DESYREL ) 50 MG tablet Take 0.5-1 tablets (25-50 mg total) by mouth at bedtime as needed for sleep.   [DISCONTINUED] cetirizine   (ZYRTEC ) 10 MG tablet Take 1 tablet (10 mg total) by mouth daily.   [DISCONTINUED] hydrOXYzine  (ATARAX ) 10 MG tablet Take 1 tablet (10 mg total) by mouth 3 (three) times daily as needed.   No facility-administered encounter medications on file as of 06/11/2024.    Review of Systems  Review of Systems  Constitutional: Negative.   HENT: Negative.    Cardiovascular: Negative.   Gastrointestinal: Negative.   Allergic/Immunologic: Negative.   Neurological: Negative.   Psychiatric/Behavioral: Negative.       Objective:   BP 121/76   Pulse 65   Temp 98 F (36.7 C) (Temporal)   Wt 182 lb (82.6 kg)   SpO2 98%   BMI 29.38 kg/m   Wt Readings from Last 5 Encounters:  06/11/24 182 lb (82.6 kg)  03/11/24 178 lb (80.7 kg)  12/23/23 182 lb 6.4 oz (82.7 kg)  12/10/23 182 lb 6.4 oz (82.7 kg)  09/10/23 188 lb 6.4 oz (85.5 kg)     Physical Exam Vitals and nursing note reviewed.  Constitutional:      General: He is not in acute distress.    Appearance: He is well-developed.  Cardiovascular:     Rate and Rhythm: Normal rate and regular rhythm.  Pulmonary:     Effort: Pulmonary effort is normal.     Breath sounds: Normal breath sounds.  Skin:    General: Skin is warm and dry.  Neurological:     Mental Status: He is alert and oriented to person, place, and time.       Assessment & Plan:   Type 2 diabetes mellitus without complication, without long-term current use of insulin  (HCC) -     POCT glycosylated hemoglobin (Hb A1C) -     metFORMIN  HCl ER; Take 1 tablet (500 mg total) by mouth 2 (two) times daily with a meal.  Dispense: 90 tablet; Refill: 1  Hyperlipidemia LDL goal <100 -     Atorvastatin  Calcium ; Take 1 tablet (40 mg total) by mouth daily.  Dispense: 90 tablet; Refill: 2  Insomnia, unspecified type -     hydrOXYzine  HCl; Take 1 tablet (10 mg total) by mouth 3 (three) times daily as needed.  Dispense: 30 tablet; Refill: 0 -     traZODone  HCl; Take 0.5-1 tablets  (25-50 mg total) by mouth at bedtime as needed for sleep.  Dispense: 90 tablet; Refill: 2  Other orders -     Aspirin ; Chew 1 tablet (81 mg total) by mouth daily.  Dispense: 90 tablet; Refill: 2 -     Cetirizine  HCl; Take 1 tablet (10 mg total) by mouth daily.  Dispense: 30 tablet; Refill: 11 -     Fluticasone  Propionate; Place 2 sprays into both nostrils daily.  Dispense: 16 g; Refill: 6     Return in about 3 months (around 09/09/2024).   Bascom GORMAN Borer, NP 06/11/2024     [1] No Known Allergies [2]  Social History Tobacco Use  Smoking Status Never  Smokeless Tobacco Never  Tobacco Comments   Pt states he smokes once or twice a month   "

## 2024-09-09 ENCOUNTER — Ambulatory Visit: Payer: Self-pay | Admitting: Nurse Practitioner
